# Patient Record
Sex: Female | Born: 1995
Health system: Southern US, Community
[De-identification: ages and names within clinical notes are randomized; demographics above are authoritative.]

## PROBLEM LIST (undated history)

## (undated) DIAGNOSIS — G4733 Obstructive sleep apnea (adult) (pediatric): Secondary | ICD-10-CM

## (undated) DIAGNOSIS — F411 Generalized anxiety disorder: Secondary | ICD-10-CM

## (undated) DIAGNOSIS — D509 Iron deficiency anemia, unspecified: Secondary | ICD-10-CM

## (undated) DIAGNOSIS — R002 Palpitations: Secondary | ICD-10-CM

## (undated) DIAGNOSIS — F329 Major depressive disorder, single episode, unspecified: Secondary | ICD-10-CM

## (undated) DIAGNOSIS — F32A Depression, unspecified: Secondary | ICD-10-CM

## (undated) DIAGNOSIS — F1210 Cannabis abuse, uncomplicated: Secondary | ICD-10-CM

## (undated) DIAGNOSIS — J45990 Exercise induced bronchospasm: Secondary | ICD-10-CM

## (undated) DIAGNOSIS — F938 Other childhood emotional disorders: Secondary | ICD-10-CM

## (undated) DIAGNOSIS — Z8719 Personal history of other diseases of the digestive system: Secondary | ICD-10-CM

## (undated) DIAGNOSIS — A6920 Lyme disease, unspecified: Secondary | ICD-10-CM

## (undated) DIAGNOSIS — Z87898 Personal history of other specified conditions: Secondary | ICD-10-CM

## (undated) DIAGNOSIS — K589 Irritable bowel syndrome without diarrhea: Secondary | ICD-10-CM

## (undated) DIAGNOSIS — K603 Anal fistula, unspecified: Secondary | ICD-10-CM

## (undated) DIAGNOSIS — J45909 Unspecified asthma, uncomplicated: Secondary | ICD-10-CM

## (undated) DIAGNOSIS — Z8619 Personal history of other infectious and parasitic diseases: Secondary | ICD-10-CM

## (undated) DIAGNOSIS — F3181 Bipolar II disorder: Secondary | ICD-10-CM

## (undated) DIAGNOSIS — F39 Unspecified mood [affective] disorder: Secondary | ICD-10-CM

## (undated) DIAGNOSIS — F4310 Post-traumatic stress disorder, unspecified: Secondary | ICD-10-CM

## (undated) DIAGNOSIS — F319 Bipolar disorder, unspecified: Secondary | ICD-10-CM

## (undated) DIAGNOSIS — K219 Gastro-esophageal reflux disease without esophagitis: Secondary | ICD-10-CM

## (undated) DIAGNOSIS — F419 Anxiety disorder, unspecified: Secondary | ICD-10-CM

## (undated) DIAGNOSIS — K5090 Crohn's disease, unspecified, without complications: Secondary | ICD-10-CM

## (undated) HISTORY — PX: TONSILECTOMY/ADENOIDECTOMY WITH MYRINGOTOMY: SHX6125

## (undated) HISTORY — PX: OTHER SURGICAL HISTORY: SHX169

---
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
## 2018-04-18 ENCOUNTER — Encounter (HOSPITAL_COMMUNITY): Payer: Self-pay

## 2018-04-18 ENCOUNTER — Emergency Department (HOSPITAL_COMMUNITY)
Admission: EM | Admit: 2018-04-18 | Discharge: 2018-04-18 | Disposition: A | Discharge status: Home / Self Care | Payer: BLUE CROSS/BLUE SHIELD | Attending: Emergency Medicine | Admitting: Emergency Medicine

## 2018-04-18 ENCOUNTER — Other Ambulatory Visit: Payer: Self-pay

## 2018-04-18 DIAGNOSIS — B9689 Other specified bacterial agents as the cause of diseases classified elsewhere: Secondary | ICD-10-CM

## 2018-04-18 DIAGNOSIS — Z79899 Other long term (current) drug therapy: Secondary | ICD-10-CM | POA: Diagnosis not present

## 2018-04-18 DIAGNOSIS — N760 Acute vaginitis: Secondary | ICD-10-CM | POA: Diagnosis not present

## 2018-04-18 DIAGNOSIS — N898 Other specified noninflammatory disorders of vagina: Secondary | ICD-10-CM | POA: Diagnosis present

## 2018-04-18 DIAGNOSIS — B373 Candidiasis of vulva and vagina: Secondary | ICD-10-CM

## 2018-04-18 DIAGNOSIS — B3731 Acute candidiasis of vulva and vagina: Secondary | ICD-10-CM

## 2018-04-18 LAB — URINALYSIS, ROUTINE W REFLEX MICROSCOPIC
BILIRUBIN URINE: NEGATIVE
GLUCOSE, UA: NEGATIVE mg/dL
HGB URINE DIPSTICK: NEGATIVE
Ketones, ur: NEGATIVE mg/dL
NITRITE: NEGATIVE
PROTEIN: NEGATIVE mg/dL
Specific Gravity, Urine: 1.005 (ref 1.005–1.030)
pH: 5 (ref 5.0–8.0)

## 2018-04-18 LAB — WET PREP, GENITAL
Sperm: NONE SEEN
Trich, Wet Prep: NONE SEEN
Yeast Wet Prep HPF POC: NONE SEEN

## 2018-04-18 LAB — I-STAT BETA HCG BLOOD, ED (MC, WL, AP ONLY): I-stat hCG, quantitative: <5 m[IU]/mL (ref <5)

## 2018-04-18 MED ORDER — METRONIDAZOLE 500 MG PO TABS: 500.0000 mg | ORAL_TABLET | Freq: Two times a day (BID) | ORAL | 0 refills | Status: DC

## 2018-04-18 MED ORDER — AZITHROMYCIN 250 MG PO TABS
1000.0000 mg | ORAL_TABLET | Freq: Once | ORAL | Status: AC
  Administered 2018-04-18: 1000 mg via ORAL
  Filled 2018-04-18: qty 4

## 2018-04-18 MED ORDER — FLUCONAZOLE 150 MG PO TABS: 150.0000 mg | ORAL_TABLET | Freq: Once | ORAL | 0 refills | Status: AC

## 2018-04-18 MED ORDER — CEFTRIAXONE SODIUM 250 MG IJ SOLR
250.0000 mg | Freq: Once | INTRAMUSCULAR | Status: AC
  Administered 2018-04-18: 250 mg via INTRAMUSCULAR
  Filled 2018-04-18: qty 250

## 2018-04-18 MED ORDER — STERILE WATER FOR INJECTION IJ SOLN
INTRAMUSCULAR | Status: AC
  Administered 2018-04-18: 1 mL
  Filled 2018-04-18: qty 10

## 2018-04-18 MED ORDER — FLUCONAZOLE 150 MG PO TABS
150.0000 mg | ORAL_TABLET | Freq: Once | ORAL | Status: AC
  Administered 2018-04-18: 150 mg via ORAL
  Filled 2018-04-18: qty 1

## 2018-04-18 NOTE — ED Provider Notes (Signed)
[facility]   CSN: [id number] Arrival date & time: 04/18/18  2055     History   Chief Complaint Chief Complaint  Patient presents with  . Vaginal Discharge  . Urinary Frequency    HPI Chelsea Hoover is a 22 y.o. female with a PMHx of anxiety, IBS, bipolar disorder, and other conditions listed below, who presents to the ED with complaints of 4 days of vaginal itching and burning as well as white vaginal discharge, states that she is concerned that she has "urethritis".  She has not tried anything for her symptoms, and wiping the vaginal area seems to worsen her symptoms.  She also reports associated increased urinary frequency and urgency as well as dysuria.  Additionally she noticed 2 small bumps on her right vaginal area.  She denies any recent changes in feminine products or douches.  She is sexually active with one female partner in the last year, unprotected.  LMP was 3 weeks ago.  She has an IUD in place.  She has an OB/GYN but she cannot recall the name of the practice or of the provider.  She denies fevers, chills, CP, SOB, abd pain, N/V/D/C, hematuria, malodorous urine, vaginal bleeding, myalgias, arthralgias, numbness, tingling, focal weakness, or any other complaints at this time.   The history is provided by the patient and medical records. No language interpreter was used.  Vaginal Discharge   Associated symptoms include dysuria and frequency. Pertinent negatives include no fever, no abdominal pain, no constipation, no diarrhea, no nausea and no vomiting.  Urinary Frequency  Pertinent negatives include no chest pain, no abdominal pain and no shortness of breath.    Past Medical History:  Diagnosis Date  . Anxiety   . 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 Bipolar 1 disorder (HCC)   . Cannabis abuse 09/03/2015  . Depression   . Irritable bowel syndrome   . Lyme disease   . Mood disorder (HCC)   . PTSD (post-traumatic  stress disorder) 09/01/2015    Patient Active Problem List   Diagnosis Date Noted  . Cannabis abuse 09/03/2015  . Bipolar 1 disorder, depressed (HCC) 09/01/2015  . 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 PTSD (post-traumatic stress disorder) 09/01/2015    History reviewed. No pertinent surgical history.   OB History   None      Home Medications    Prior to Admission medications   Medication Sig Start Date End Date Taking? Authorizing Provider  acetaminophen (TYLENOL) 500 MG tablet Take 500 mg by mouth daily as needed for moderate pain.   Yes [provider]  buPROPion (WELLBUTRIN SR) 100 MG 12 hr tablet Take 100 mg by mouth daily.  03/09/18  Yes [provider]  famotidine (PEPCID) 20 MG tablet Take 20 mg by mouth at bedtime.  03/16/18  Yes [provider]  hydrOXYzine (ATARAX/VISTARIL) 25 MG tablet Take 25 mg by mouth every 6 (six) hours as needed for anxiety.  03/24/18  Yes [provider]  lamoTRIgine (LAMICTAL) 150 MG tablet Take 150 mg by mouth 2 (two) times daily.   Yes [provider]  levonorgestrel (MIRENA) 20 MCG/24HR IUD 1 each by Intrauterine route once.   Yes [provider]  omeprazole (PRILOSEC) 40 MG capsule Take 40 mg by mouth 3 (three) times daily.  04/08/18  Yes [provider]  traZODone (DESYREL) 100 MG tablet Take 100 mg by mouth at bedtime.   Yes [provider]  citalopram (CELEXA) 10 MG tablet Take 1 tablet (10 mg total) by mouth daily. Patient not taking: Reported on 04/18/2018 09/08/15   Truman Hayward, FNP  lamoTRIgine (LAMICTAL) 200 MG tablet Take 1 tablet (200 mg total) by mouth daily after supper. Patient not taking: Reported on 07/07/2016 09/08/15   Truman Hayward, FNP  ondansetron (ZOFRAN) 4 MG tablet Take 1 tablet (4 mg total) by mouth every 8 (eight) hours as needed for nausea or vomiting. Patient not taking: Reported on 04/18/2018 07/07/16   Pisciotta, Joni Reining, PA-C    senna-docusate (SENOKOT-S) 8.6-50 MG tablet Start 1 tab QHS, up to 2tabs BID. Max 4 tabs daily for constipation Patient not taking: Reported on 04/18/2018 07/07/16   Pisciotta, Joni Reining, PA-C  traZODone (DESYREL) 100 MG tablet Take 1 tablet (100 mg total) by mouth at bedtime. Patient not taking: Reported on 04/18/2018 09/08/15   Truman Hayward, FNP    Family History Family History  Problem Relation Age of Onset  . Crohn's disease Father     Social History Social History   Tobacco Use  . Smoking status: Never Smoker  . Smokeless tobacco: Never Used  Substance Use Topics  . Alcohol use: No  . Drug use: Yes    Types: Marijuana    Comment: Few times a week     Allergies   Patient has no known allergies.   Review of Systems Review of Systems  Constitutional: Negative for chills and fever.  Respiratory: Negative for shortness of breath.   Cardiovascular: Negative for chest pain.  Gastrointestinal: Negative for abdominal pain, constipation, diarrhea, nausea and vomiting.  Genitourinary: Positive for dysuria, frequency, genital sores, urgency, vaginal discharge and vaginal pain (itching/burning). Negative for hematuria and vaginal bleeding.       No malodorous urine  Musculoskeletal: Negative for arthralgias and myalgias.  Skin: Negative for color change.  Allergic/Immunologic: Negative for immunocompromised state.  Neurological: Negative for weakness and numbness.  Psychiatric/Behavioral: Negative for confusion.   All other systems reviewed and are negative for acute change except as noted in the HPI.    Physical Exam Updated Vital Signs BP 122/83 (BP Location: Left Arm)   Pulse 93   Temp 98.8 F (37.1 C) (Oral)   Resp 16   Ht 5\' 1"  (1.549 m)   Wt 89.8 kg   SpO2 100%   BMI 37.41 kg/m   Physical Exam  Constitutional: She is oriented to person, place, and time. Vital signs are normal. She appears well-developed and well-nourished.  Non-toxic appearance. No distress.   Afebrile, nontoxic, NAD  HENT:  Head: Normocephalic and atraumatic.  Mouth/Throat: Oropharynx is clear and moist and mucous membranes are normal.  Eyes: Conjunctivae and EOM are normal. Right eye exhibits no discharge. Left eye exhibits no discharge.  Neck: Normal range of motion. Neck supple.  Cardiovascular: Normal rate, regular rhythm, normal heart sounds and intact distal pulses. Exam reveals no gallop and no friction rub.  No murmur heard. Pulmonary/Chest: Effort normal and breath sounds normal. No respiratory distress. She has no decreased breath sounds. She has no wheezes. She has no rhonchi. She has no rales.  Abdominal: Soft. Normal appearance and bowel sounds are normal. She exhibits no distension. There is no tenderness. There is no rigidity, no rebound, no guarding, no CVA tenderness, no tenderness at McBurney's point and negative Murphy's sign.  Soft, obese but nondistended, +BS throughout, with minimal suprapubic discomfort on palpation but pt denies any pain to palpation in the suprapubic area or  elsewhere in the abdomen, no r/g/r, neg murphy's, neg mcburney's, no CVA TTP   Genitourinary: Uterus normal. Pelvic exam was performed with patient supine. There is no rash, tenderness or lesion on the right labia. There is no rash, tenderness or lesion on the left labia. Cervix exhibits discharge and friability. Cervix exhibits no motion tenderness. Right adnexum displays no mass, no tenderness and no fullness. Left adnexum displays no mass, no tenderness and no fullness. There is erythema and tenderness in the vagina. No bleeding in the vagina. Vaginal discharge found.  Genitourinary Comments: Chaperone present for exam. No rashes, lesions, or tenderness to external genitalia. No injury to vaginal mucosa, however diffuse erythema and tenderness to vaginal mucosa. Thick white clumpy vaginal discharge without bleeding within vaginal vault. No adnexal masses, tenderness, or fullness. No CMT,  however some mild cervical friability and some yellowish discharge from cervical os. Cervical os is closed with IUD string in place. Uterus non-deviated, mobile, nonTTP, and without enlargement.    Musculoskeletal: Normal range of motion.  Neurological: She is alert and oriented to person, place, and time. She has normal strength. No sensory deficit.  Skin: Skin is warm, dry and intact. No rash noted.  Psychiatric: She has a normal mood and affect.  Nursing note and vitals reviewed.    ED Treatments / Results  Labs (all labs ordered are listed, but only abnormal results are displayed) Labs Reviewed  WET PREP, GENITAL - Abnormal; Notable for the following components:      Result Value   Clue Cells Wet Prep HPF POC PRESENT (*)    WBC, Wet Prep HPF POC MANY (*)    All other components within normal limits  URINALYSIS, ROUTINE W REFLEX MICROSCOPIC - Abnormal; Notable for the following components:   Color, Urine STRAW (*)    Leukocytes, UA MODERATE (*)    Bacteria, UA RARE (*)    All other components within normal limits  RPR  HIV ANTIBODY (ROUTINE TESTING)  I-STAT BETA HCG BLOOD, ED ([facility], [facility], [facility] ONLY)  GC/CHLAMYDIA PROBE AMP ([place]) NOT [facility]    EKG None  Radiology No results found.  Procedures Procedures (including critical care time)  Medications Ordered in ED Medications  azithromycin (ZITHROMAX) tablet 1,000 mg (1,000 mg Oral Given 04/18/18 2257)    And  cefTRIAXone (ROCEPHIN) injection 250 mg (250 mg Intramuscular Given 04/18/18 2258)  fluconazole (DIFLUCAN) tablet 150 mg (150 mg Oral Given 04/18/18 2258)  sterile water (preservative free) injection (1 mL  Given 04/18/18 2258)     Initial Impression / Assessment and Plan / ED Course  I have reviewed the triage vital signs and the nursing notes.  Pertinent labs & imaging results that were available during my care of the patient were reviewed by me and considered in my medical decision making (see chart for  details).     22 y.o. female here with vaginal itching/burning and discharge x4 days. Also with urinary frequency/urgency and dysuria. On exam, very mild suprapubic discomfort but no actual TTP to abdomen, pelvic exam reveals erythematous irritated vaginal mucosa which is mildly TTP with thick clumpy white vaginal discharge in vaginal vault and slightly yellowish cervical discharge and some cervical friability but no CMT and no adnexal tenderness. Will send STD panel, wet prep, and get U/A and betaHCG. Will likely empirically treat for yeast and GC/CT but will await betaHCG and wet prep results. Doubt need for other labs or imaging. Will reassess shortly.   11:13 PM U/A  with no nitrites, +leuks, 0-5 squamous and WBC/RBCs, rare bacteria; doubt UTI, symptoms likely from vaginitis/urethritis. BetaHCG neg. Wet prep with many WBCs and +clue cells, no trich/yeast seen. Will still empirically treat for yeast vaginitis given symptoms/exam findings, and will send home with tx for BV. Will send home with diflucan dose to take after finishing flagyl, if she still has symptoms. Empirically treated for GC/CT today. Discussed abstinence x10 days. F/up with health dept for future STD concerns. Safe sex encouraged, and discussed having partners tested and treated before re-engaging in intercourse after the 10 day abstinence period. Advised avoiding harsh soaps/detergents and douching. F/up with her OBGYN in 1wk for recheck of symptoms. I explained the diagnosis and have given explicit precautions to return to the ER including for any other new or worsening symptoms. The patient understands and accepts the medical plan as it's been dictated and I have answered their questions. Discharge instructions concerning home care and prescriptions have been given. The patient is STABLE and is discharged to home in good condition.    Final Clinical Impressions(s) / ED Diagnoses   Final diagnoses:  Yeast vaginitis  BV (bacterial  vaginosis)    ED Discharge Orders         Ordered    fluconazole (DIFLUCAN) 150 MG tablet   Once     04/18/18 2312    metroNIDAZOLE (FLAGYL) 500 MG tablet  2 times daily     04/18/18 [address]           Shauntae Reitman, [place], [place] JerseyPA-C 04/18/18 2314    Tegeler, Canary Brimhristopher J, MD 04/19/18 0001

## 2018-04-18 NOTE — ED Triage Notes (Addendum)
Pt presents to ED from home for possible UTI and vaginal infection. PT reports that she noticed symptoms 2 days ago. Pt reports urinary frequency, and pain with urination. Pt also endorsing vaginal discharge.

## 2018-04-18 NOTE — Discharge Instructions (Addendum)
Your vaginal swab today showed bacterial vaginosis, take Flagyl as directed until completed and DO NOT DRINK ALCOHOL WHILE TAKING THIS MEDICATION. You also have been treated for a vaginal yeast infection today, if you still have symptoms after finishing the flagyl then take the second dose of Diflucan (yeast infection medication), but if your symptoms have resolved then you don't have to take the second dose of Diflucan. Avoid douching, avoid harsh soaps/detergents or feminine products.  You have also been treated for gonorrhea and chlamydia in the ER but the hospital will call you if lab is positive. You were tested for HIV and Syphilis, and the hospital will call you if the lab is positive. NO SEXUAL INTERCOURSE FOR AT LEAST 10 DAYS AFTER TODAY'S VISIT, THIS WILL INVALIDATE YOUR TREATMENT HERE. DO NOT ENGAGE IN SEXUAL ACTIVITY UNTIL YOU FIND OUT ABOUT YOUR RESULTS AND HAVE PARTNERS TESTED AND TREATED. ALL PARTNERS MUST BE TESTED AND TREATED FOR STD'S. ALWAYS USE CONDOMS WHEN ENGAGING IN INTERCOURSE. [facility] for future STD concerns or screenings.  Follow up with your regular OBGYN in 1 week for recheck of symptoms. [facility]) for any changes or worsening symptoms.

## 2018-04-19 LAB — RPR: RPR Ser Ql: NONREACTIVE

## 2018-04-19 LAB — HIV ANTIBODY (ROUTINE TESTING W REFLEX): HIV Screen 4th Generation wRfx: NONREACTIVE

## 2018-04-20 LAB — GC/CHLAMYDIA PROBE AMP (~~LOC~~) NOT AT ARMC
CHLAMYDIA, DNA PROBE: NEGATIVE
Neisseria Gonorrhea: NEGATIVE

## 2019-04-14 HISTORY — PX: TONSILLECTOMY/ADENOIDECTOMY/TURBINATE REDUCTION: SHX6126

## 2019-09-06 ENCOUNTER — Inpatient Hospital Stay (HOSPITAL_COMMUNITY)
Admission: RE | Admit: 2019-09-06 | Discharge: 2019-09-13 | DRG: 885 | Disposition: A | Discharge status: Home / Self Care | Payer: BLUE CROSS/BLUE SHIELD | Attending: Psychiatry | Admitting: Psychiatry

## 2019-09-06 DIAGNOSIS — Z91040 Latex allergy status: Secondary | ICD-10-CM | POA: Diagnosis not present

## 2019-09-06 DIAGNOSIS — K219 Gastro-esophageal reflux disease without esophagitis: Secondary | ICD-10-CM | POA: Diagnosis present

## 2019-09-06 DIAGNOSIS — F1210 Cannabis abuse, uncomplicated: Secondary | ICD-10-CM | POA: Diagnosis present

## 2019-09-06 DIAGNOSIS — F319 Bipolar disorder, unspecified: Secondary | ICD-10-CM | POA: Diagnosis present

## 2019-09-06 DIAGNOSIS — R45851 Suicidal ideations: Secondary | ICD-10-CM | POA: Diagnosis not present

## 2019-09-06 DIAGNOSIS — K589 Irritable bowel syndrome without diarrhea: Secondary | ICD-10-CM | POA: Diagnosis present

## 2019-09-06 DIAGNOSIS — Z915 Personal history of self-harm: Secondary | ICD-10-CM

## 2019-09-06 DIAGNOSIS — G4700 Insomnia, unspecified: Secondary | ICD-10-CM | POA: Diagnosis present

## 2019-09-06 DIAGNOSIS — Z20822 Contact with and (suspected) exposure to covid-19: Secondary | ICD-10-CM | POA: Diagnosis present

## 2019-09-06 DIAGNOSIS — Z79899 Other long term (current) drug therapy: Secondary | ICD-10-CM | POA: Diagnosis not present

## 2019-09-06 DIAGNOSIS — Z885 Allergy status to narcotic agent status: Secondary | ICD-10-CM

## 2019-09-07 ENCOUNTER — Encounter (HOSPITAL_COMMUNITY): Payer: Self-pay | Admitting: Nurse Practitioner

## 2019-09-07 ENCOUNTER — Other Ambulatory Visit: Payer: Self-pay

## 2019-09-07 DIAGNOSIS — F319 Bipolar disorder, unspecified: Principal | ICD-10-CM | POA: Diagnosis present

## 2019-09-07 LAB — COMPREHENSIVE METABOLIC PANEL
ALT: 18 U/L (ref 0–44)
AST: 18 U/L (ref 15–41)
Albumin: 4.4 g/dL (ref 3.5–5.0)
Alkaline Phosphatase: 89 U/L (ref 38–126)
Anion gap: 10 (ref 5–15)
BUN: 17 mg/dL (ref 6–20)
CO2: 24 mmol/L (ref 22–32)
Calcium: 9.2 mg/dL (ref 8.9–10.3)
Chloride: 103 mmol/L (ref 98–111)
Creatinine, Ser: 0.84 mg/dL (ref 0.44–1.00)
GFR calc Af Amer: >60 mL/min (ref >60)
GFR calc non Af Amer: >60 mL/min (ref >60)
Glucose, Bld: 80 mg/dL (ref 70–99)
Potassium: 3.9 mmol/L (ref 3.5–5.1)
Sodium: 137 mmol/L (ref 135–145)
Total Bilirubin: 0.3 mg/dL (ref 0.3–1.2)
Total Protein: 7.5 g/dL (ref 6.5–8.1)

## 2019-09-07 LAB — LIPID PANEL
Cholesterol: 166 mg/dL (ref 0–200)
HDL: 63 mg/dL (ref >40)
LDL Cholesterol: 89 mg/dL (ref 0–99)
Total CHOL/HDL Ratio: 2.6 RATIO
Triglycerides: 72 mg/dL (ref <150)
VLDL: 14 mg/dL (ref 0–40)

## 2019-09-07 LAB — CBC
HCT: 36.6 % (ref 36.0–46.0)
Hemoglobin: 11 g/dL — ABNORMAL LOW (ref 12.0–15.0)
MCH: 23.6 pg — ABNORMAL LOW (ref 26.0–34.0)
MCHC: 30.1 g/dL (ref 30.0–36.0)
MCV: 78.4 fL — ABNORMAL LOW (ref 80.0–100.0)
Platelets: 473 10*3/uL — ABNORMAL HIGH (ref 150–400)
RBC: 4.67 MIL/uL (ref 3.87–5.11)
RDW: 16.9 % — ABNORMAL HIGH (ref 11.5–15.5)
WBC: 15.3 10*3/uL — ABNORMAL HIGH (ref 4.0–10.5)
nRBC: 0 % (ref 0.0–0.2)

## 2019-09-07 LAB — RAPID URINE DRUG SCREEN, HOSP PERFORMED
Amphetamines: NOT DETECTED
Barbiturates: NOT DETECTED
Benzodiazepines: NOT DETECTED
Cocaine: NOT DETECTED
Opiates: NOT DETECTED
Tetrahydrocannabinol: NOT DETECTED

## 2019-09-07 LAB — PREGNANCY, URINE: Preg Test, Ur: NEGATIVE

## 2019-09-07 LAB — RESPIRATORY PANEL BY RT PCR (FLU A&B, COVID)
Influenza A by PCR: NEGATIVE
Influenza B by PCR: NEGATIVE
SARS Coronavirus 2 by RT PCR: NEGATIVE

## 2019-09-07 LAB — TSH: TSH: 3.841 u[IU]/mL (ref 0.350–4.500)

## 2019-09-07 MED ORDER — TRAZODONE HCL 50 MG PO TABS
50.0000 mg | ORAL_TABLET | Freq: Every day | ORAL | Status: DC
  Administered 2019-09-07 – 2019-09-11 (×5): 50 mg via ORAL
  Filled 2019-09-07 (×7): qty 1

## 2019-09-07 MED ORDER — ALUM & MAG HYDROXIDE-SIMETH 200-200-20 MG/5ML PO SUSP
30.0000 mL | ORAL | Status: DC | PRN
Start: 2019-09-07 — End: 2019-09-13

## 2019-09-07 MED ORDER — LOPERAMIDE HCL 2 MG PO CAPS: 2.0000 mg | ORAL_CAPSULE | ORAL | Status: DC | PRN

## 2019-09-07 MED ORDER — LAMOTRIGINE 150 MG PO TABS
150.0000 mg | ORAL_TABLET | Freq: Two times a day (BID) | ORAL | Status: DC
  Administered 2019-09-07 – 2019-09-13 (×13): 150 mg via ORAL
  Filled 2019-09-07 (×15): qty 1

## 2019-09-07 MED ORDER — HYDROXYZINE HCL 25 MG PO TABS: 25.0000 mg | ORAL_TABLET | Freq: Four times a day (QID) | ORAL | Status: DC | PRN

## 2019-09-07 MED ORDER — ACETAMINOPHEN 325 MG PO TABS
650.0000 mg | ORAL_TABLET | Freq: Four times a day (QID) | ORAL | Status: DC | PRN
  Administered 2019-09-07: 21:00:00 650 mg via ORAL
  Filled 2019-09-07: qty 2

## 2019-09-07 MED ORDER — MAGNESIUM HYDROXIDE 400 MG/5ML PO SUSP: 30.0000 mL | Freq: Every day | ORAL | Status: DC | PRN

## 2019-09-07 MED ORDER — FAMOTIDINE 20 MG PO TABS
20.0000 mg | ORAL_TABLET | Freq: Two times a day (BID) | ORAL | Status: DC
  Administered 2019-09-07 – 2019-09-13 (×13): 20 mg via ORAL
  Filled 2019-09-07 (×16): qty 1

## 2019-09-07 MED ORDER — ADULT MULTIVITAMIN W/MINERALS CH
1.0000 | ORAL_TABLET | Freq: Every day | ORAL | Status: DC
  Administered 2019-09-07 – 2019-09-13 (×7): 1 via ORAL
  Filled 2019-09-07 (×9): qty 1

## 2019-09-07 MED ORDER — HYDROXYZINE HCL 25 MG PO TABS
25.0000 mg | ORAL_TABLET | Freq: Four times a day (QID) | ORAL | Status: AC | PRN
  Administered 2019-09-07: 21:00:00 25 mg via ORAL
  Filled 2019-09-07: qty 1

## 2019-09-07 MED ORDER — LORAZEPAM 1 MG PO TABS: 1.0000 mg | ORAL_TABLET | Freq: Four times a day (QID) | ORAL | Status: AC | PRN

## 2019-09-07 MED ORDER — TRAZODONE HCL 100 MG PO TABS
100.0000 mg | ORAL_TABLET | Freq: Every day | ORAL | Status: DC
Start: 2019-09-07 — End: 2019-09-07
  Filled 2019-09-07 (×2): qty 1

## 2019-09-07 MED ORDER — ONDANSETRON HCL 4 MG PO TABS
4.0000 mg | ORAL_TABLET | Freq: Once | ORAL | Status: AC
  Administered 2019-09-07: 21:00:00 4 mg via ORAL
  Filled 2019-09-07 (×2): qty 1

## 2019-09-07 MED ORDER — ARIPIPRAZOLE 5 MG PO TABS
5.0000 mg | ORAL_TABLET | Freq: Every day | ORAL | Status: DC
  Administered 2019-09-07 – 2019-09-10 (×4): 5 mg via ORAL
  Filled 2019-09-07 (×7): qty 1

## 2019-09-07 MED ORDER — THIAMINE HCL 100 MG PO TABS
100.0000 mg | ORAL_TABLET | Freq: Every day | ORAL | Status: DC
  Administered 2019-09-08 – 2019-09-13 (×6): 100 mg via ORAL
  Filled 2019-09-07 (×9): qty 1

## 2019-09-07 MED ORDER — ONDANSETRON 4 MG PO TBDP: 4.0000 mg | ORAL_TABLET | Freq: Four times a day (QID) | ORAL | Status: DC | PRN

## 2019-09-07 MED ORDER — PANTOPRAZOLE SODIUM 40 MG PO TBEC
40.0000 mg | DELAYED_RELEASE_TABLET | Freq: Every day | ORAL | Status: DC
  Administered 2019-09-07 – 2019-09-13 (×7): 40 mg via ORAL
  Filled 2019-09-07 (×8): qty 1

## 2019-09-07 MED ORDER — THIAMINE HCL 100 MG/ML IJ SOLN
100.0000 mg | Freq: Once | INTRAMUSCULAR | Status: AC
  Administered 2019-09-07: 100 mg via INTRAMUSCULAR
  Filled 2019-09-07: qty 2

## 2019-09-07 NOTE — [facility] Suicide Risk Assessment (Addendum)
[facility] Admission Suicide Risk Assessment   Nursing information obtained from:  Patient, Review of record Demographic factors:  Adolescent or young adult Current Mental Status:  Suicidal ideation indicated by patient, Suicide plan Loss Factors:  NA Historical Factors:  Prior suicide attempts Risk Reduction Factors:  Positive social support, Employed, Positive therapeutic relationship, Sense of responsibility to family, Living with another person, especially a relative, Positive coping skills or problem solving skills  Total Time spent with patient: 45 minutes Principal Problem:  Bipolar Disorder , Depressed  Diagnosis:  Active Problems:   Bipolar 1 disorder ([place])  Subjective Data:   Continued Clinical Symptoms:  Alcohol Use Disorder Identification Test Final Score (AUDIT): 11 The "Alcohol Use Disorders Identification Test", Guidelines for Use in Primary Care, Second Edition.  World Pharmacologist [facility]). Score between 0-7:  no or low risk or alcohol related problems. Score between 8-15:  moderate risk of alcohol related problems. Score between 16-19:  high risk of alcohol related problems. Score 20 or above:  warrants further diagnostic evaluation for alcohol dependence and treatment.   CLINICAL FACTORS:  23, single, no children, lives with parents, employed . Presented to hospital voluntarily reporting worsening depression. Reports she suspects she had COVID several weeks ago ( family members were COVID positive, she also had symptoms , but she was not tested ). Reports she has a chronic history of depression but had been generally doing well until then. States " after that I started going down hill, as if my medications stopped working ". She reports contributing stressors- strained , stressful home environment, particularly with father. States her family is [employer] and she left church , which has made family life more tense. She also reports stressful job. She reports recent passive  SI, without plan or intention, but states this last weekend had thoughts of crashing car or of overdosing. Denies history of psychosis. Endorses neuro-vegetative symptoms- poor sleep, increased appetite, decreased energy, anhedonia. History of GERD and of exercise induced asthma. NKDA ( reports oxycodone caused vomiting). Does not smoke tobacco. Denies history of seizures  Home medications - Lamictal 150 mgrs BID, Wellbutrin XL 150 mgrs QDAY , Trazodone 100 mgrs QHS. She reports she was taking these medications regularly up to a few days prior to admission.  History of prior psychiatric admissions , most recently in 2016 for depression.  Reports she has been diagnosed with Bipolar Disorder and also reports history of chronic anxiety, and describes episodes of increased energy, racing thoughts, irritability usually lasting a day or two.  History of suicide attempt by overdose in 2016. Past history of self cutting, states she last cut 2 years ago.  Reports she has been drinking up to 6 beers or shots of liquor on most days. Reports history of alcohol blackouts, denies history of seizures . Smokes cannabis daily.  Dx- Bipolar Disorder, Depressed , Alcohol Use Disorder , consider Substance Induced Mood Disorder - Depressed   Plan- Inpatient treatment . We reviewed treatment options . She states she would prefer to avoid medications associated with high risk of gaining weight Will start Ativan PRN as per CIWA protocol for alcohol WDL if needed  Agrees to Abilify trial, start 5 mgrs QDAY  Will resume Lamictal at 150 mgrs BID. Patient reports Wellbutrin XL has been well tolerated and helpful ( until recently). Would consider increasing dose to 300 mgrs QDAY, but will defer restarting Wellbutrin XL at this time as there may be an increased risk of seizure  in the context  of alcohol WDL.       Musculoskeletal: Strength & Muscle Tone: within normal limits no current tremors or diaphoresis, no  restlessness or psychomotor agitation.  Gait & Station: normal Patient leans: N/A  Psychiatric Specialty Exam: Physical Exam  Review of Systems reports history of headaches, no chest pain, no shortness of breath, no cough, no vomiting , no rash  Blood pressure (!) 128/94, pulse 87, temperature 99 F (37.2 C), temperature source Oral, resp. rate 16, height 5\' 1"  (1.549 m), weight 88.9 kg, last menstrual period 09/07/2019.Body mass index is 37.03 kg/m.  General Appearance: Fairly Groomed  Eye Contact:  Good  Speech:  Normal Rate  Volume:  Decreased  Mood:  Depressed  Affect:  Constricted  Thought Process:  Linear and Descriptions of Associations: Intact  Orientation:  Other:  fully alert and attentive   Thought Content:  no hallucinations,no delusions   Suicidal Thoughts:  No  Denies current suicidal ideations or self injurious ideations, contracts for safety on unit, denies homicidal or violent ideations   Homicidal Thoughts:  No  Memory:  recent and remote grossly intact   Judgement:  Fair  Insight:  Fair  Psychomotor Activity:  Normal- no tremors, no diaphoresis  Concentration:  Concentration: Good and Attention Span: Good  Recall:  Good  Fund of Knowledge:  Good  Language:  Good  Akathisia:  Negative  Handed:  Right  AIMS (if indicated):     Assets:  Desire for Improvement Resilience  ADL's:  Intact  Cognition:  WNL  Sleep:  Number of Hours: 0.5(just admitted to the unit)      COGNITIVE FEATURES THAT CONTRIBUTE TO RISK:  Closed-mindedness and Loss of executive function    SUICIDE RISK:   Moderate:  Frequent suicidal ideation with limited intensity, and duration, some specificity in terms of plans, no associated intent, good self-control, limited dysphoria/symptomatology, some risk factors present, and identifiable protective factors, including available and accessible social support.  PLAN OF CARE: Patient will be admitted to inpatient psychiatric unit for  stabilization and safety. Will provide and encourage milieu participation. Provide medication management and maked adjustments as needed. Will also start medication to minimize risk of alcohol WDL .  Will follow daily.    I certify that inpatient services furnished can reasonably be expected to improve the patient's condition.   11/05/2019, MD 09/07/2019, 10:05 AM

## 2019-09-07 NOTE — Progress Notes (Addendum)
Patient ID: Chelsea Hoover, female   DOB: 01-09-1996, 24 y.o.   MRN: [id number] Admission Note  D: Pt is 23 y o female who drove herself to [facility] for increased SI with thoughts of cutting herself to commit suicide. Pt stressors: family conflict with father and job dissatisfaction (works at [employer]). Pt uses alcohol to cope with stressors. Pt denies SI/HI/AV. Pt is pleasant and cooperative. Pt has hx of IBS and is having discomfort now.  A: Pt belongings searched and placed in locker along with PTA meds in belongings. Pt height and weight checked and paperwork signed.Pt was offered support. Provider paged and order placed for famotidine 20 mg. Q 15 minute checks for safety started.  R:Pt oriented to room on unit and introduced to staff. Pt has no complaints at this time.Pt receptive to treatment and safety maintained on unit.
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
## 2019-09-07 NOTE — Progress Notes (Signed)
    09/07/19 1000  Psych Admission Type (Psych Patients Only)  Admission Status Voluntary  Psychosocial Assessment  Patient Complaints Depression  Eye Contact Brief  Facial Expression Flat  Affect Appropriate to circumstance  Speech Logical/coherent  Interaction Assertive  Motor Activity Slow  Appearance/Hygiene Unremarkable  Behavior Characteristics Cooperative  Mood Depressed  Aggressive Behavior  Effect No apparent injury  Thought Process  Coherency WDL  Content WDL  Delusions WDL  Perception WDL  Hallucination None reported or observed  Judgment WDL  Confusion WDL  Danger to Self  Current suicidal ideation? Passive  Self-Injurious Behavior No self-injurious ideation or behavior indicators observed or expressed   Agreement Not to Harm Self Yes  Description of Agreement Verbal contract  Danger to Others  Danger to Others None reported or observed

## 2019-09-07 NOTE — H&P (Signed)
Behavioral Health Medical Screening Exam  Chelsea Hoover is an 24 y.o. female.  Total Time spent with patient: 15 minutes  Psychiatric Specialty Exam: Physical Exam  Constitutional: She is oriented to person, place, and time. She appears well-developed and well-nourished. No distress.  HENT:  Head: Normocephalic and atraumatic.  Right Ear: External ear normal.  Left Ear: External ear normal.  Eyes: Pupils are equal, round, and reactive to light. Right eye exhibits no discharge. Left eye exhibits no discharge.  Respiratory: Effort normal. No respiratory distress.  Musculoskeletal:        General: Normal range of motion.  Neurological: She is alert and oriented to person, place, and time.  Skin: She is not diaphoretic.  Psychiatric: Her mood appears anxious. She is not withdrawn and not actively hallucinating. Thought content is not paranoid and not delusional. She exhibits a depressed mood. She expresses suicidal ideation. She expresses no homicidal ideation. She expresses suicidal plans.    Review of Systems  Constitutional: Positive for activity change and appetite change. Negative for chills, diaphoresis, fatigue, fever and unexpected weight change.  Respiratory: Negative for cough, shortness of breath and wheezing.   Cardiovascular: Negative for chest pain.  Gastrointestinal: Negative for diarrhea, nausea and vomiting.  Musculoskeletal: Negative.   Neurological: Negative.   Psychiatric/Behavioral: Positive for decreased concentration, dysphoric mood, sleep disturbance and suicidal ideas. Negative for hallucinations. The patient is nervous/anxious.     There were no vitals taken for this visit.There is no height or weight on file to calculate BMI.  General Appearance: Casual and Fairly Groomed  Eye Contact:  Fair  Speech:  Clear and Coherent and Normal Rate  Volume:  Decreased  Mood:  Anxious, Depressed, Hopeless and Worthless  Affect:  Congruent and Depressed  Thought Process:   Coherent, Goal Directed, Linear and Descriptions of Associations: Intact  Orientation:  Full (Time, Place, and Person)  Thought Content:  Logical and Hallucinations: None  Suicidal Thoughts:  Yes.  with intent/plan  Homicidal Thoughts:  No  Memory:  Immediate;   Good Recent;   Good  Judgement:  Intact  Insight:  Lacking  Psychomotor Activity:  Normal  Concentration: Concentration: Fair  Recall:  Good  Fund of Knowledge:Good  Language: Good  Akathisia:  Negative  Handed:  Right  AIMS (if indicated):     Assets:  Communication Skills Desire for Improvement Financial Resources/Insurance Housing Leisure Time Physical Health  Sleep:       Musculoskeletal: Strength & Muscle Tone: within normal limits Gait & Station: normal Patient leans: N/A  There were no vitals taken for this visit.  Recommendations:  Based on my evaluation the patient does not appear to have an emergency medical condition.  Jackelyn Poling, NP 09/07/2019, 1:03 AM

## 2019-09-07 NOTE — Tx Team (Addendum)
Initial Treatment Plan 09/07/2019 3:57 AM [name] MGQ:[id number]    PATIENT STRESSORS: Family conflict Job issues   PATIENT STRENGTHS: Average or above average intelligence Capable of independent living Communication skills   PATIENT IDENTIFIED PROBLEMS: Alcohol use  Job dissatisfaction  Depression                 DISCHARGE CRITERIA:  Improved stabilization in mood, thinking, and/or behavior Motivation to continue treatment in a less acute level of care Verbal commitment to aftercare and medication compliance  PRELIMINARY DISCHARGE PLAN: Attend PHP/IOP Outpatient therapy Return to previous living arrangement Return to previous work or school arrangements  PATIENT/FAMILY INVOLVEMENT: This treatment plan has been presented to and reviewed with the patient, Tonye Tancredi, and/or family member.  The patient and family have been given the opportunity to ask questions and make suggestions.  Victorino December, RN 09/07/2019, 3:57 AM

## 2019-09-07 NOTE — Progress Notes (Signed)
Patient ID: Chelsea Hoover, female   DOB: 16-Mar-1996, 24 y.o.   MRN: [id number] Pt presents with SI, plan to cut herself as SI attempt.  Pt admits to 2 previous SI attempts.  Denies HI or AVH.  Pt reports she smokes a bowl of Marijuana daily to help her sleep.  Pt follows up with Dr Jannifer Franklin on out patient basis.  Skin search completed, COVID test is negative.  Monitoring for safety,  Pending report & transfer to [address].

## 2019-09-07 NOTE — Progress Notes (Signed)
Adult Psychoeducational Group Note  Date:  09/07/2019 Time:  9:01 PM  Group Topic/Focus:  Wrap-Up Group:   The focus of this group is to help patients review their daily goal of treatment and discuss progress on daily workbooks.  Participation Level:  Minimal  Participation Quality:  Appropriate  Affect:  Appropriate  Cognitive:  Appropriate  Insight: Lacking  Engagement in Group:  Engaged  Modes of Intervention:  Discussion  Additional Comments:  Patient attended group, but did not participate because she was feeling ill.   Melesa Lecy W Marie Borowski 09/07/2019, 9:01 PM
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
## 2019-09-07 NOTE — Progress Notes (Signed)
Recreation Therapy Notes  Animal-Assisted Activity (AAA) Program Checklist/Progress Notes Patient Eligibility Criteria Checklist & Daily Group note for Rec Tx Intervention  Date: 1.5.21 Time: 1430 Location: [address]   AAA/T Program Assumption of Risk Form signed by Engineer, production or Parent Legal Guardian  YES   Patient is free of allergies or sever asthma  YES   Patient reports no fear of animals  YES   Patient reports no history of cruelty to animals  YES   Patient understands his/her participation is voluntary  YES   Patient washes hands before animal contact  YES   Patient washes hands after animal contact  YES   Behavioral Response: Engaged  Education: Charity fundraiser, Appropriate Animal Interaction   Education Outcome: Acknowledges understanding/In group clarification offered/Needs additional education.   Clinical Observations/Feedback:  Pt attended and participated in activity.    Caroll Rancher, LRT/CTRS         Caroll Rancher A 09/07/2019 3:38 PM

## 2019-09-07 NOTE — Plan of Care (Signed)
Pt just admitted to the unit.  Problem: Safety: Goal: Periods of time without injury will increase Outcome: Progressing
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
## 2019-09-08 LAB — HEMOGLOBIN A1C
Hgb A1c MFr Bld: 5.3 % (ref 4.8–5.6)
Mean Plasma Glucose: 105 mg/dL

## 2019-09-08 MED ORDER — BUPROPION HCL ER (XL) 150 MG PO TB24
150.0000 mg | ORAL_TABLET | Freq: Every day | ORAL | Status: DC
  Administered 2019-09-09 – 2019-09-10 (×2): 150 mg via ORAL
  Filled 2019-09-08 (×4): qty 1

## 2019-09-08 NOTE — Progress Notes (Signed)
Pt c/o nausea and headache. Pt asked if this was what withdrawal from EtOH was like. Answered in the affirmative. Pt CIWA=10. Pt given Vistaril 25 mg, zofran 4 mg and tylenol 650 mg.

## 2019-09-08 NOTE — [facility] Suicide Risk Assessment (Signed)
[facility] INPATIENT:  Family/Significant Other Suicide Prevention Education  Suicide Prevention Education:  Education Completed; with mother, Jamile Rekowski [phone]) has been identified by the patient as the family member/significant other with whom the patient will be residing, and identified as the person(s) who will aid the patient in the event of a mental health crisis (suicidal ideations/suicide attempt).  With written consent from the patient, the family member/significant other has been provided the following suicide prevention education, prior to the and/or following the discharge of the patient.  The suicide prevention education provided includes the following:  Suicide risk factors  Suicide prevention and interventions  National Suicide Hotline telephone number  [facility] assessment telephone number  [facility] Emergency Assistance 911  [facility] and/or Residential Mobile Crisis Unit telephone number  Request made of family/significant other to:  Remove weapons (e.g., guns, rifles, knives), all items previously/currently identified as safety concern.    Remove drugs/medications (over-the-counter, prescriptions, illicit drugs), all items previously/currently identified as a safety concern.  The family member/significant other verbalizes understanding of the suicide prevention education information provided.  The family member/significant other agrees to remove the items of safety concern listed above.  Nalani reports having some concerns regarding the patient's alcohol use. She reports that "alcoholism runs in our family" and she does want her daughter to follow those footsteps. Nalani shared that the patient is very responsible and is compliant with her medications. There were no further questions or concerns at this time.   Maeola Sarah 09/08/2019, 10:06 AM

## 2019-09-08 NOTE — Tx Team (Signed)
Interdisciplinary Treatment and Diagnostic Plan Update  09/08/2019 Time of Session: 9:00am Chelsea Hoover MRN: [id number]  Principal Diagnosis: <principal problem not specified>  Secondary Diagnoses: Active Problems:   Bipolar 1 disorder (HCC)   Current Medications:  Current Facility-Administered Medications  Medication Dose Route Frequency Provider Last Rate Last Admin  . acetaminophen (TYLENOL) tablet 650 mg  650 mg Oral Q6H PRN Nira Conn A, NP   650 mg at 09/07/19 2036  . alum & mag hydroxide-simeth (MAALOX/MYLANTA) 200-200-20 MG/5ML suspension 30 mL  30 mL Oral Q4H PRN Nira Conn A, NP      . ARIPiprazole (ABILIFY) tablet 5 mg  5 mg Oral Daily Cobos, Rockey Situ, MD   5 mg at 09/08/19 0846  . famotidine (PEPCID) tablet 20 mg  20 mg Oral BID Nira Conn A, NP   20 mg at 09/08/19 0845  . hydrOXYzine (ATARAX/VISTARIL) tablet 25 mg  25 mg Oral Q6H PRN Cobos, Rockey Situ, MD   25 mg at 09/07/19 2037  . lamoTRIgine (LAMICTAL) tablet 150 mg  150 mg Oral BID Nira Conn A, NP   150 mg at 09/08/19 0846  . LORazepam (ATIVAN) tablet 1 mg  1 mg Oral Q6H PRN Cobos, Fernando A, MD      . magnesium hydroxide (MILK OF MAGNESIA) suspension 30 mL  30 mL Oral Daily PRN Nira Conn A, NP      . multivitamin with minerals tablet 1 tablet  1 tablet Oral Daily Cobos, Rockey Situ, MD   1 tablet at 09/08/19 0845  . pantoprazole (PROTONIX) EC tablet 40 mg  40 mg Oral Daily Nira Conn A, NP   40 mg at 09/08/19 0846  . thiamine tablet 100 mg  100 mg Oral Daily Cobos, Rockey Situ, MD   100 mg at 09/08/19 0848  . traZODone (DESYREL) tablet 50 mg  50 mg Oral QHS Cobos, Rockey Situ, MD   50 mg at 09/07/19 2129   PTA Medications: Medications Prior to Admission  Medication Sig Dispense Refill Last Dose  . acetaminophen (TYLENOL) 500 MG tablet Take 1,000 mg by mouth every 6 (six) hours as needed for moderate pain or headache.    09/05/2019  . buPROPion (WELLBUTRIN XL) 150 MG 24 hr tablet Take 150 mg by mouth  daily.   09/05/2019  . Colloidal Oatmeal (AVEENO ECZEMA THERAPY) 1 % CREA Apply 1 application topically as needed (For eczema.).   09/06/2019  . famotidine (PEPCID) 20 MG tablet Take 20 mg by mouth 2 (two) times daily.    09/06/2019  . hydrOXYzine (ATARAX/VISTARIL) 25 MG tablet Take 25 mg by mouth every 6 (six) hours as needed (For panic attacks.).    Past Month  . lamoTRIgine (LAMICTAL) 150 MG tablet Take 150 mg by mouth 2 (two) times daily.   09/05/2019  . traZODone (DESYREL) 100 MG tablet Take 100 mg by mouth at bedtime.   09/05/2019    Patient Stressors:    Patient Strengths: Average or above average intelligence Capable of independent living Communication skills  Treatment Modalities: Medication Management, Group therapy, Case management,  1 to 1 session with clinician, Psychoeducation, Recreational therapy.   Physician Treatment Plan for Primary Diagnosis: <principal problem not specified> Long Term Goal(s): Improvement in symptoms so as ready for discharge Improvement in symptoms so as ready for discharge   Short Term Goals: Ability to identify changes in lifestyle to reduce recurrence of condition will improve Ability to verbalize feelings will improve Ability to disclose and discuss suicidal ideas  Ability to demonstrate self-control will improve Ability to identify and develop effective coping behaviors will improve  Medication Management: Evaluate patient's response, side effects, and tolerance of medication regimen.  Therapeutic Interventions: 1 to 1 sessions, Unit Group sessions and Medication administration.  Evaluation of Outcomes: Progressing  Physician Treatment Plan for Secondary Diagnosis: Active Problems:   Bipolar 1 disorder ([place])  Long Term Goal(s): Improvement in symptoms so as ready for discharge Improvement in symptoms so as ready for discharge   Short Term Goals: Ability to identify changes in lifestyle to reduce recurrence of condition will improve Ability to  verbalize feelings will improve Ability to disclose and discuss suicidal ideas Ability to demonstrate self-control will improve Ability to identify and develop effective coping behaviors will improve     Medication Management: Evaluate patient's response, side effects, and tolerance of medication regimen.  Therapeutic Interventions: 1 to 1 sessions, Unit Group sessions and Medication administration.  Evaluation of Outcomes: Progressing   RN Treatment Plan for Primary Diagnosis: <principal problem not specified> Long Term Goal(s): Knowledge of disease and therapeutic regimen to maintain health will improve  Short Term Goals: Ability to demonstrate self-control, Ability to verbalize feelings will improve and Ability to identify and develop effective coping behaviors will improve  Medication Management: RN will administer medications as ordered by provider, will assess and evaluate patient's response and provide education to patient for prescribed medication. RN will report any adverse and/or side effects to prescribing provider.  Therapeutic Interventions: 1 on 1 counseling sessions, Psychoeducation, Medication administration, Evaluate responses to treatment, Monitor vital signs and CBGs as ordered, Perform/monitor CIWA, COWS, AIMS and Fall Risk screenings as ordered, Perform wound care treatments as ordered.  Evaluation of Outcomes: Progressing   LCSW Treatment Plan for Primary Diagnosis: <principal problem not specified> Long Term Goal(s): Safe transition to appropriate next level of care at discharge, Engage patient in therapeutic group addressing interpersonal concerns.  Short Term Goals: Engage patient in aftercare planning with referrals and resources, Increase social support, Increase emotional regulation, Identify triggers associated with mental health/substance abuse issues and Increase skills for wellness and recovery  Therapeutic Interventions: Assess for all discharge needs, 1  to 1 time with Social worker, Explore available resources and support systems, Assess for adequacy in community support network, Educate family and significant other(s) on suicide prevention, Complete Psychosocial Assessment, Interpersonal group therapy.  Evaluation of Outcomes: Progressing  Progress in Treatment: Attending groups: Yes. Participating in groups: Yes. Taking medication as prescribed: Yes. Toleration medication: Yes. Family/Significant other contact made: No, will contact:  mother Patient understands diagnosis: Yes. Discussing patient identified problems/goals with staff: Yes. Medical problems stabilized or resolved: Yes. Denies suicidal/homicidal ideation: No. Issues/concerns per patient self-inventory: No.  New problem(s) identified: No, Describe:  none.  New Short Term/Long Term Goal(s): medication management for mood stabilization; elimination of SI thoughts; development of comprehensive mental wellness/sobriety plan.  Patient Goals: "Get my medications straightened out."  Discharge Plan or Barriers: Patient expected to return home and will continue with Dr.Akintayo for medication management and her current therapist.  Reason for Continuation of Hospitalization: Anxiety Depression Medication stabilization Suicidal ideation  Estimated Length of Stay: 1-3 days  Attendees: Patient: Chelsea Hoover 09/08/2019 9:18 AM  Physician: Larene Beach 09/08/2019 9:18 AM  Nursing: Rise Paganini, RN  09/08/2019 9:18 AM  RN Care Manager: 09/08/2019 9:18 AM  Social Worker: Stephanie Acre, Metcalf 09/08/2019 9:18 AM  Recreational Therapist:  09/08/2019 9:18 AM  Other:  09/08/2019 9:18 AM  Other:  09/08/2019 9:18 AM  Other:  09/08/2019 9:18 AM    Scribe for Treatment Team: Darreld Mclean, LCSWA 09/08/2019 9:18 AM

## 2019-09-08 NOTE — Progress Notes (Signed)
Recreation Therapy Notes  Date:  1.6.21 Time: 0930 Location: [address] Dayroom  Group Topic: Stress Management  Goal Area(s) Addresses:  Patient will identify positive stress management techniques. Patient will identify benefits of using stress management post d/c.  Behavioral Response:  Engaged  Intervention: Stress Management  Activity :  Guided Imagery.  LRT read Hoover script that encouraged patients to enjoy the sounds of waves while relaxing on the beach. Patients were to listen and follow along as meditation.  Education:  Stress Management, Discharge Planning.   Education Outcome: Acknowledges Education  Clinical Observations/Feedback: Pt attended and participated in activity.    Chelsea Hoover, LRT/CTRS         Chelsea Hoover, Chelsea Hoover 09/08/2019 11:03 AM

## 2019-09-08 NOTE — Plan of Care (Signed)
Nurse discussed anxiety, depression and coping skills with patient.  

## 2019-09-08 NOTE — [facility] Group Notes (Signed)
LCSW Group Therapy Note 09/08/2019 2:31 PM  Type of Therapy and Topic: Group Therapy: Overcoming Obstacles  Participation Level: Active  Description of Group:  In this group patients will be encouraged to explore what they see as obstacles to their own wellness and recovery. They will be guided to discuss their thoughts, feelings, and behaviors related to these obstacles. The group will process together ways to cope with barriers, with attention given to specific choices patients can make. Each patient will be challenged to identify changes they are motivated to make in order to overcome their obstacles. This group will be process-oriented, with patients participating in exploration of their own experiences as well as giving and receiving support and challenge from other group members.  Therapeutic Goals: 1. Patient will identify personal and current obstacles as they relate to admission. 2. Patient will identify barriers that currently interfere with their wellness or overcoming obstacles.  3. Patient will identify feelings, thought process and behaviors related to these barriers. 4. Patient will identify two changes they are willing to make to overcome these obstacles:   Summary of Patient Progress  Chelsea Hoover was engaged and participated throughout the group session. Chelsea Hoover reports that her only obstacle is "I am extremely lethargic". She states she is not sure if it is her medications or her suspected alcohol withdrawals.    Therapeutic Modalities:  Cognitive Behavioral Therapy Solution Focused Therapy Motivational Interviewing Relapse Prevention Therapy   Chelsea Hoover Clinical Social Worker

## 2019-09-08 NOTE — Progress Notes (Addendum)
[facility] MD Progress Note  09/08/2019 10:58 AM Chelsea Hoover  MRN:  [id number] Subjective:  "I'm alright."  Ms. Chelsea Hoover found lying in bed. She appears fatigued and depressed. She reports good sleep overnight but continues to feel tired. She has been feeling tired since having COVID in November but reports feeling even more tired now. Denies other continuing COVID symptoms besides fatigue. Per nursing report she was complaining of withdrawal symptoms last night- nausea, headache, light sensitivity, tremors. She continues to report a headache today but denies other withdrawal symptoms currently. Her last drink was on the 3rd. She continues to worry about stressors at home. Her mother called last night and was scolding her for her drinking. Her family is also blaming her for her younger sister leaving the [employer] after the patient did. She reports the [employer] was a toxic environment for her and feels her experience there contributed to her mental health issues.  She continues to report depressed mood but does show fuller range of affect today and more future-oriented. She is planning to look for a higher-paying job so that she can move out from her parents' home. [place]. Her boyfriend visited last night, and she identifies him as a positive support. She denies SI/HI/AVH.  From admission H&P: Ms. Chelsea Hoover is a 24 year old female with history of bipolar disorder, presenting voluntarily for suicidal ideation with thoughts of cutting herself. She had COVID in early November and reports worsening symptoms of depression since that time, related to several weeks of isolation and feeling sick.  Principal Problem: <principal problem not specified> Diagnosis: Active Problems:   Bipolar 1 disorder (HCC)  Total Time spent with patient: 20 minutes  Past Psychiatric History: See admission H&P  Past Medical History:  Past Medical History:  Diagnosis Date  . Anxiety    . 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 Bipolar 1 disorder (HCC)   . Cannabis abuse 09/03/2015  . Depression   . Irritable bowel syndrome   . Lyme disease   . Mood disorder (HCC)   . PTSD (post-traumatic stress disorder) 09/01/2015   History reviewed. No pertinent surgical history. Family History:  Family History  Problem Relation Age of Onset  . Crohn's disease Father    Family Psychiatric  History: See admission H&P Social History:  Social History   Substance and Sexual Activity  Alcohol Use No     Social History   Substance and Sexual Activity  Drug Use Yes  . Types: Marijuana   Comment: Few times a week    Social History   Socioeconomic History  . Marital status: Single    Spouse name: Not on file  . Number of children: Not on file  . Years of education: Not on file  . Highest education level: Not on file  Occupational History  . Not on file  Tobacco Use  . Smoking status: Never Smoker  . Smokeless tobacco: Never Used  Substance and Sexual Activity  . Alcohol use: No  . Drug use: Yes    Types: Marijuana    Comment: Few times a week  . Sexual activity: Yes    Birth control/protection: Pill  Other Topics Concern  . Not on file  Social History Narrative  . Not on file   Social Determinants of Health   Financial Resource Strain:   . Difficulty of Paying Living Expenses: Not on file  Food Insecurity:   . Worried About [employer] Out of  Food in the Last Year: Not on file  . Ran Out of Food in the Last Year: Not on file  Transportation Needs:   . Lack of Transportation (Medical): Not on file  . Lack of Transportation (Non-Medical): Not on file  Physical Activity:   . Days of Exercise per Week: Not on file  . Minutes of Exercise per Session: Not on file  Stress:   . Feeling of Stress : Not on file  Social Connections:   . Frequency of Communication with Friends and Family: Not on file  . Frequency of Social Gatherings with Friends and Family: Not  on file  . Attends Religious Services: Not on file  . Active Member of Clubs or Organizations: Not on file  . Attends Banker Meetings: Not on file  . Marital Status: Not on file   Additional Social History:    Pain Medications: None Prescriptions: Welbutrin, Lamictal, Trazadone, Amogadone Over the Counter: Stomach acid reducer (hx of IBS) History of alcohol / drug use?: Yes Name of Substance 1: ETOH (beer & liquor) 1 - Age of First Use: 24 years of age 77 - Amount (size/oz): 4 beers and some liquor 1 - Frequency: Daily 1 - Duration: Over the last few weeks 1 - Last Use / Amount: 09/04/19 Name of Substance 2: Marijuana 2 - Age of First Use: 24 years of age 6 - Amount (size/oz): One bowl 2 - Frequency: Nightly 2 - Duration: ongoing 2 - Last Use / Amount: 09/04/19                Sleep: Good  Appetite:  Good  Current Medications: Current Facility-Administered Medications  Medication Dose Route Frequency Provider Last Rate Last Admin  . acetaminophen (TYLENOL) tablet 650 mg  650 mg Oral Q6H PRN Nira Conn A, NP   650 mg at 09/07/19 2036  . alum & mag hydroxide-simeth (MAALOX/MYLANTA) 200-200-20 MG/5ML suspension 30 mL  30 mL Oral Q4H PRN Nira Conn A, NP      . ARIPiprazole (ABILIFY) tablet 5 mg  5 mg Oral Daily Reizel Calzada, Rockey Situ, MD   5 mg at 09/08/19 0846  . [START ON 09/09/2019] buPROPion (WELLBUTRIN XL) 24 hr tablet 150 mg  150 mg Oral Daily Jackquline Branca A, MD      . famotidine (PEPCID) tablet 20 mg  20 mg Oral BID Nira Conn A, NP   20 mg at 09/08/19 0845  . hydrOXYzine (ATARAX/VISTARIL) tablet 25 mg  25 mg Oral Q6H PRN Lovenia Debruler, Rockey Situ, MD   25 mg at 09/07/19 2037  . lamoTRIgine (LAMICTAL) tablet 150 mg  150 mg Oral BID Nira Conn A, NP   150 mg at 09/08/19 0846  . LORazepam (ATIVAN) tablet 1 mg  1 mg Oral Q6H PRN Trygg Mantz A, MD      . magnesium hydroxide (MILK OF MAGNESIA) suspension 30 mL  30 mL Oral Daily PRN Nira Conn A, NP      .  multivitamin with minerals tablet 1 tablet  1 tablet Oral Daily Dejion Grillo, Rockey Situ, MD   1 tablet at 09/08/19 0845  . pantoprazole (PROTONIX) EC tablet 40 mg  40 mg Oral Daily Nira Conn A, NP   40 mg at 09/08/19 0846  . thiamine tablet 100 mg  100 mg Oral Daily Ivalee Strauser, Rockey Situ, MD   100 mg at 09/08/19 0848  . traZODone (DESYREL) tablet 50 mg  50 mg Oral QHS Merritt Mccravy, Rockey Situ, MD   50  mg at 09/07/19 2129    Lab Results:  Results for orders placed or performed during the hospital encounter of 09/06/19 (from the past 48 hour(s))  Respiratory Panel by RT PCR (Flu A&B, Covid) - Nasopharyngeal Swab     Status: None   Collection Time: 09/06/19 11:45 PM   Specimen: Nasopharyngeal Swab  Result Value Ref Range   SARS Coronavirus 2 by RT PCR NEGATIVE NEGATIVE    Comment: (NOTE) SARS-CoV-2 target nucleic acids are NOT DETECTED. The SARS-CoV-2 RNA is generally detectable in upper respiratoy specimens during the acute phase of infection. The lowest concentration of SARS-CoV-2 viral copies this assay can detect is 131 copies/mL. A negative result does not preclude SARS-Cov-2 infection and should not be used as the sole basis for treatment or other patient management decisions. A negative result may occur with  improper specimen collection/handling, submission of specimen other than nasopharyngeal swab, presence of viral mutation(s) within the areas targeted by this assay, and inadequate number of viral copies (<131 copies/mL). A negative result must be combined with clinical observations, patient history, and epidemiological information. The expected result is Negative. Fact Sheet for Patients:  https://www.moore.com/https://www.fda.gov/media/142436/download Fact Sheet for Healthcare Providers:  https://www.young.biz/https://www.fda.gov/media/142435/download This test is not yet ap proved or cleared by the [place] FDA and  has been authorized for detection and/or diagnosis of SARS-CoV-2 by FDA under an Emergency Use Authorization  (EUA). This EUA will remain  in effect (meaning this test can be used) for the duration of the COVID-19 declaration under Section 564(b)(1) of the Act, 21 U.S.C. section 360bbb-3(b)(1), unless the authorization is terminated or revoked sooner.    Influenza A by PCR NEGATIVE NEGATIVE   Influenza B by PCR NEGATIVE NEGATIVE    Comment: (NOTE) The Xpert Xpress SARS-CoV-2/FLU/RSV assay is intended as an aid in  the diagnosis of influenza from Nasopharyngeal swab specimens and  should not be used as a sole basis for treatment. Nasal washings and  aspirates are unacceptable for Xpert Xpress SARS-CoV-2/FLU/RSV  testing. Fact Sheet for Patients: https://www.moore.com/https://www.fda.gov/media/142436/download Fact Sheet for Healthcare Providers: https://www.young.biz/https://www.fda.gov/media/142435/download This test is not yet approved or cleared by the [place] FDA and  has been authorized for detection and/or diagnosis of SARS-CoV-2 by  FDA under an Emergency Use Authorization (EUA). This EUA will remain  in effect (meaning this test can be used) for the duration of the  Covid-19 declaration under Section 564(b)(1) of the Act, 21  U.S.C. section 360bbb-3(b)(1), unless the authorization is  terminated or revoked. Performed at [facility], [address] [place], [place] [address]   CBC     Status: Abnormal   Collection Time: 09/07/19  6:19 AM  Result Value Ref Range   WBC 15.3 (H) 4.0 - 10.5 K/uL   RBC 4.67 3.87 - 5.11 MIL/uL   Hemoglobin 11.0 (L) 12.0 - 15.0 g/dL   HCT 60.436.6 54.036.0 - 98.146.0 %   MCV 78.4 (L) 80.0 - 100.0 fL   MCH 23.6 (L) 26.0 - 34.0 pg   MCHC 30.1 30.0 - 36.0 g/dL   RDW 19.116.9 (H) 47.811.5 - 29.515.5 %   Platelets 473 (H) 150 - 400 K/uL   nRBC 0.0 0.0 - 0.2 %    Comment: Performed at [facility], [address] [place], [place] [address]  Comprehensive metabolic panel     Status: None   Collection Time: 09/07/19  6:19 AM  Result Value Ref Range   Sodium 137 135 - 145  mmol/L  Potassium 3.9 3.5 - 5.1 mmol/L   Chloride 103 98 - 111 mmol/L   CO2 24 22 - 32 mmol/L   Glucose, Bld 80 70 - 99 mg/dL   BUN 17 6 - 20 mg/dL   Creatinine, Ser 2.58 0.44 - 1.00 mg/dL   Calcium 9.2 8.9 - 52.7 mg/dL   Total Protein 7.5 6.5 - 8.1 g/dL   Albumin 4.4 3.5 - 5.0 g/dL   AST 18 15 - 41 U/L   ALT 18 0 - 44 U/L   Alkaline Phosphatase 89 38 - 126 U/L   Total Bilirubin 0.3 0.3 - 1.2 mg/dL   GFR calc non Af Amer >60 >60 mL/min   GFR calc Af Amer >60 >60 mL/min   Anion gap 10 5 - 15    Comment: Performed at [facility], [address] [place], [place] [address]  Hemoglobin A1c     Status: None   Collection Time: 09/07/19  6:19 AM  Result Value Ref Range   Hgb A1c MFr Bld 5.3 4.8 - 5.6 %    Comment: (NOTE)         Prediabetes: 5.7 - 6.4         Diabetes: >6.4         Glycemic control for adults with diabetes: <7.0    Mean Plasma Glucose 105 mg/dL    Comment: (NOTE) Performed At: [facility] [address] [place], [place] [id number] Jolene Schimke MD VQ:[id number]   Lipid panel     Status: None   Collection Time: 09/07/19  6:19 AM  Result Value Ref Range   Cholesterol 166 0 - 200 mg/dL   Triglycerides 72 <932 mg/dL   HDL 63 >67 mg/dL   Total CHOL/HDL Ratio 2.6 RATIO   VLDL 14 0 - 40 mg/dL   LDL Cholesterol 89 0 - 99 mg/dL    Comment:        Total Cholesterol/HDL:CHD Risk Coronary Heart Disease Risk Table                     Men   Women  1/2 Average Risk   3.4   3.3  Average Risk       5.0   4.4  2 X Average Risk   9.6   7.1  3 X Average Risk  23.4   11.0        Use the calculated Patient Ratio above and the CHD Risk Table to determine the patient's CHD Risk.        ATP III CLASSIFICATION (LDL):  <100     mg/dL   Optimal  124-580  mg/dL   Near or Above                    Optimal  130-159  mg/dL   Borderline  998-338  mg/dL   High  >250     mg/dL   Very High Performed at [facility], [address] [place], [place] [address]   TSH     Status: None   Collection Time: 09/07/19  6:19 AM  Result Value Ref Range   TSH 3.841 0.350 - 4.500 uIU/mL    Comment: Performed by a 3rd Generation assay with a functional sensitivity of <=0.01 uIU/mL. Performed at [facility], [address], [place], [place] [address]   Urine rapid drug screen (hosp performed)not at [facility]     Status: None   Collection  Time: 09/07/19  9:17 AM  Result Value Ref Range   Opiates NONE DETECTED NONE DETECTED   Cocaine NONE DETECTED NONE DETECTED   Benzodiazepines NONE DETECTED NONE DETECTED   Amphetamines NONE DETECTED NONE DETECTED   Tetrahydrocannabinol NONE DETECTED NONE DETECTED   Barbiturates NONE DETECTED NONE DETECTED    Comment: (NOTE) DRUG SCREEN FOR MEDICAL PURPOSES ONLY.  IF CONFIRMATION IS NEEDED FOR ANY PURPOSE, NOTIFY LAB WITHIN 5 DAYS. LOWEST DETECTABLE LIMITS FOR URINE DRUG SCREEN Drug Class                     Cutoff (ng/mL) Amphetamine and metabolites    1000 Barbiturate and metabolites    200 Benzodiazepine                 409 Tricyclics and metabolites     300 Opiates and metabolites        300 Cocaine and metabolites        300 THC                            50 Performed at [facility], [place] [address] [place], [place] [address]   Pregnancy, urine     Status: None   Collection Time: 09/07/19  9:17 AM  Result Value Ref Range   Preg Test, Ur NEGATIVE NEGATIVE    Comment:        THE SENSITIVITY OF THIS METHODOLOGY IS >20 mIU/mL. Performed at [facility], [place] [address], [place], [place] [address]     Blood Alcohol level:  Lab Results  Component Value Date   [facility] <11 68/34/1960    Metabolic Disorder Labs: Lab Results  Component Value Date   HGBA1C 5.3 09/07/2019   MPG 105 09/07/2019   MPG 120 09/01/2015   No results found for: PROLACTIN Lab Results  Component Value Date   CHOL 166 09/07/2019   TRIG 72 09/07/2019    HDL 63 09/07/2019   CHOLHDL 2.6 09/07/2019   VLDL 14 09/07/2019   LDLCALC 89 09/07/2019   LDLCALC 62 09/01/2015    Physical Findings: AIMS: Facial and Oral Movements Muscles of Facial Expression: None, normal Lips and Perioral Area: None, normal Jaw: None, normal Tongue: None, normal,Extremity Movements Upper (arms, wrists, hands, fingers): None, normal Lower (legs, knees, ankles, toes): None, normal, Trunk Movements Neck, shoulders, hips: None, normal, Overall Severity Severity of abnormal movements (highest score from questions above): None, normal Incapacitation due to abnormal movements: None, normal Patient's awareness of abnormal movements (rate only patient's report): No Awareness, Dental Status Current problems with teeth and/or dentures?: No Does patient usually wear dentures?: No  CIWA:  CIWA-Ar Total: 10 COWS:  COWS Total Score: 1  Musculoskeletal: Strength & Muscle Tone: within normal limits Gait & Station: normal Patient leans: N/A  Psychiatric Specialty Exam: Physical Exam  Nursing note and vitals reviewed. Constitutional: She is oriented to person, place, and time. She appears well-developed and well-nourished.  Cardiovascular: Normal rate.  Respiratory: Effort normal.  Neurological: She is alert and oriented to person, place, and time.    Review of Systems  Constitutional: Negative.   Respiratory: Negative for cough and shortness of breath.   Gastrointestinal: Negative for diarrhea, nausea and vomiting.  Neurological: Positive for headaches. Negative for dizziness, tremors and seizures.  Psychiatric/Behavioral: Positive for dysphoric mood. Negative for agitation, behavioral problems, hallucinations, self-injury, sleep disturbance and suicidal ideas. The patient is nervous/anxious. The  patient is not hyperactive.     Blood pressure 110/67, pulse 99, temperature 97.8 F (36.6 C), temperature source Oral, resp. rate 16, height 5\' 1"  (1.549 m), weight 88.9  kg, last menstrual period 09/07/2019, SpO2 100 %.Body mass index is 37.03 kg/m.  General Appearance: Fairly Groomed  Eye Contact:  Fair  Speech:  Normal Rate  Volume:  Normal  Mood:  Depressed  Affect:  Congruent  Thought Process:  Coherent  Orientation:  Full (Time, Place, and Person)  Thought Content:  Logical  Suicidal Thoughts:  No  Homicidal Thoughts:  No  Memory:  Immediate;   Good Recent;   Good Remote;   Good  Judgement:  Intact  Insight:  Fair  Psychomotor Activity:  Normal  Concentration:  Concentration: Good and Attention Span: Fair  Recall:  11/05/2019 of Knowledge:  Fair  Language:  Good  Akathisia:  No  Handed:  Right  AIMS (if indicated):     Assets:  Communication Skills Desire for Improvement Housing Physical Health Resilience  ADL's:  Intact  Cognition:  WNL  Sleep:  Number of Hours: 6.75     Treatment Plan Summary: Daily contact with patient to assess and evaluate symptoms and progress in treatment and Medication management   Continue inpatient hospitalization.  Start Wellbutrin XL 150 mg PO daily for depression Continue Ativan 1 mg PO Q6HR PRN CIWA>10 for ETOH withdrawal Continue Abilify 5 mg PO daily for mood Continue Lamictal 150 mg PO BID for mood Continue Pepcid 20 mg PO BID for GERD Continue Protonix 40 mg PO daily for GERD Continue thiamine 100 mg PO daily for supplementation Continue Vistaril 25 mg PO Q6HR PRN anxiety Continue trazodone 50 mg PO QHS PRN insomnia  Patient will participate in the therapeutic group milieu.  Discharge disposition in progress.   [employer], NP 09/08/2019, 10:58 AM   Agree with NP Note

## 2019-09-08 NOTE — Progress Notes (Signed)
D:  Patient's self inventory sheet, patient sleeps good, sleep medication helpful.  Good appetite, low energy level, poor concentration.  Rated depression and hopeless 7, anxiety 5.  Withdrawals, cramping, constipation.  Denied SI.  Physical problems, lightheaded, stomach feels tight.  Physical pain, worst pain #2, lower back, stomach.  Goal is make sure meds are right.  Plans to tell the psychiatrist how she is feeling.  No discharge plans. A:  Medications administered per MD orders.  Emotional support and encouragement given patient. R:  Denied SI and HI, contracts for safety.  Tommi Rumps

## 2019-09-08 NOTE — Progress Notes (Signed)
   09/07/19 2040  Psych Admission Type (Psych Patients Only)  Admission Status Voluntary  Psychosocial Assessment  Patient Complaints Depression;Sadness  Eye Contact Fair  Facial Expression Sad  Affect Depressed  Speech Logical/coherent  Interaction Assertive  Motor Activity Slow  Appearance/Hygiene Unremarkable  Behavior Characteristics Cooperative;Anxious  Mood Depressed;Sad  Aggressive Behavior  Effect No apparent injury  Thought Process  Coherency WDL  Content WDL  Delusions WDL  Perception WDL  Hallucination None reported or observed  Judgment WDL  Confusion None  Danger to Self  Current suicidal ideation? Passive  Self-Injurious Behavior No self-injurious ideation or behavior indicators observed or expressed   Agreement Not to Harm Self Yes  Description of Agreement Verbal contract  Danger to Others  Danger to Others None reported or observed   Pt presents with depressed mood. Endorses passive SI d/t feeling overwhelmed after speaking with MD this morning. States that she was forced to face truths about her past and present that she was in denial about, specifically how bad her drinking had become. Support given to patient and encouragement to use her time here to discover other coping mechanisms. Pt stated that group helped her to focus on something else and calmed her down. Pt denies HI, AVH. Pain 6/10 d/t withdrawal from EtOH. Pt contracts for safety.

## 2019-09-09 NOTE — Progress Notes (Signed)
[facility] MD Progress Note  09/09/2019 1:41 PM Chelsea Hoover  MRN:  [id number] Subjective: Patient describes persistent sense of sadness and depression.  Denies suicidal ideations.  Denies medication side effects at this time. Objective: I discussed case with treatment team and met with patient 24 year old single female, lives with parents.  History of mood disorder/depression.  Reported worsening depression over recent weeks.  States she suspects she had Covid infection several weeks ago (felt ill and several of her family members were positive, but she herself was not tested).  States that since then she has been feeling more depressed and that her psychiatric medications seem not to be working as they had.  Patient reports she had been drinking up to six beers or shots of liquor on most days of the week and has been experiencing alcohol-related blackouts.  She endorses contributing factors to include stressful home environment, relationship strain particularly with father, stressful job.  Today patient presents alert, attentive, calm.  At this time is not presenting with alcohol withdrawal symptoms.  No tremors, no diaphoresis, no psychomotor restlessness, no flushing or agitation, BP 104/74, pulse 98.. She reports she continues to feel depressed/sad, and her affect remains constricted denies suicidal ideations and contracts for safety on unit.  Remains ruminative regarding strained relationship with parents.  States she is angry with her parents as she recently found out that there is a history of alcoholism in the extended family which had not been shared with her.  States that if she had known said history she would have abstained  from alcohol.  Of note, patient answers 2 of 4 CAGE questions affirmatively At this time denies suicidal ideations and contracts for safety on unit. No disruptive or agitated behaviors on unit.    Principal Problem: Depression, Alcohol Use Disorder  Diagnosis: Active  Problems:   Bipolar 1 disorder (HCC)  Total Time spent with patient: 20 minutes  Past Psychiatric History: See admission H&P  Past Medical History:  Past Medical History:  Diagnosis Date  . Anxiety   . 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 Bipolar 1 disorder ([place])   . Cannabis abuse 09/03/2015  . Depression   . Irritable bowel syndrome   . Lyme disease   . Mood disorder ([place])   . PTSD (post-traumatic stress disorder) 09/01/2015   History reviewed. No pertinent surgical history. Family History:  Family History  Problem Relation Age of Onset  . Crohn's disease Father    Family Psychiatric  History: See admission H&P Social History:  Social History   Substance and Sexual Activity  Alcohol Use No     Social History   Substance and Sexual Activity  Drug Use Yes  . Types: Marijuana   Comment: Few times a week    Social History   Socioeconomic History  . Marital status: Single    Spouse name: Not on file  . Number of children: Not on file  . Years of education: Not on file  . Highest education level: Not on file  Occupational History  . Not on file  Tobacco Use  . Smoking status: Never Smoker  . Smokeless tobacco: Never Used  Substance and Sexual Activity  . Alcohol use: No  . Drug use: Yes    Types: Marijuana    Comment: Few times a week  . Sexual activity: Yes    Birth control/protection: Pill  Other Topics Concern  . Not on file  Social History Narrative  . Not on file  Social Determinants of Health   Financial Resource Strain:   . Difficulty of Paying Living Expenses: Not on file  Food Insecurity:   . Worried About Charity fundraiser in the Last Year: Not on file  . Ran Out of Food in the Last Year: Not on file  Transportation Needs:   . Lack of Transportation (Medical): Not on file  . Lack of Transportation (Non-Medical): Not on file  Physical Activity:   . Days of Exercise per Week: Not on file  . Minutes of Exercise per Session: Not  on file  Stress:   . Feeling of Stress : Not on file  Social Connections:   . Frequency of Communication with Friends and Family: Not on file  . Frequency of Social Gatherings with Friends and Family: Not on file  . Attends Religious Services: Not on file  . Active Member of Clubs or Organizations: Not on file  . Attends Archivist Meetings: Not on file  . Marital Status: Not on file   Additional Social History:    Pain Medications: None Prescriptions: Welbutrin, Lamictal, Trazadone, Amogadone Over the Counter: Stomach acid reducer (hx of IBS) History of alcohol / drug use?: Yes Name of Substance 1: ETOH (beer & liquor) 1 - Age of First Use: 24 years of age 72 - Amount (size/oz): 4 beers and some liquor 1 - Frequency: Daily 1 - Duration: Over the last few weeks 1 - Last Use / Amount: 09/04/19 Name of Substance 2: Marijuana 2 - Age of First Use: 24 years of age 61 - Amount (size/oz): One bowl 2 - Frequency: Nightly 2 - Duration: ongoing 2 - Last Use / Amount: 09/04/19  Sleep: Good  Appetite:  Good  Current Medications: Current Facility-Administered Medications  Medication Dose Route Frequency Provider Last Rate Last Admin  . acetaminophen (TYLENOL) tablet 650 mg  650 mg Oral Q6H PRN Lindon Romp A, NP   650 mg at 09/07/19 2036  . alum & mag hydroxide-simeth (MAALOX/MYLANTA) 200-200-20 MG/5ML suspension 30 mL  30 mL Oral Q4H PRN Lindon Romp A, NP      . ARIPiprazole (ABILIFY) tablet 5 mg  5 mg Oral Daily Jasman Murri, Myer Peer, MD   5 mg at 09/09/19 0813  . buPROPion (WELLBUTRIN XL) 24 hr tablet 150 mg  150 mg Oral Daily Tiaria Biby, Myer Peer, MD   150 mg at 09/09/19 0815  . famotidine (PEPCID) tablet 20 mg  20 mg Oral BID Lindon Romp A, NP   20 mg at 09/09/19 0813  . hydrOXYzine (ATARAX/VISTARIL) tablet 25 mg  25 mg Oral Q6H PRN Anavey Coombes, Myer Peer, MD   25 mg at 09/07/19 2037  . lamoTRIgine (LAMICTAL) tablet 150 mg  150 mg Oral BID Lindon Romp A, NP   150 mg at 09/09/19  0813  . LORazepam (ATIVAN) tablet 1 mg  1 mg Oral Q6H PRN Alexandr Yaworski A, MD      . magnesium hydroxide (MILK OF MAGNESIA) suspension 30 mL  30 mL Oral Daily PRN Lindon Romp A, NP      . multivitamin with minerals tablet 1 tablet  1 tablet Oral Daily Tashawna Thom, Myer Peer, MD   1 tablet at 09/09/19 0813  . pantoprazole (PROTONIX) EC tablet 40 mg  40 mg Oral Daily Lindon Romp A, NP   40 mg at 09/09/19 0813  . thiamine tablet 100 mg  100 mg Oral Daily Trygve Thal, Myer Peer, MD   100 mg at 09/09/19 0813  .  traZODone (DESYREL) tablet 50 mg  50 mg Oral QHS Debar Plate, Myer Peer, MD   50 mg at 09/08/19 2133    Lab Results:  No results found for this or any previous visit (from the past 48 hour(s)).  Blood Alcohol level:  Lab Results  Component Value Date   [facility] <11 92/92/4462    Metabolic Disorder Labs: Lab Results  Component Value Date   HGBA1C 5.3 09/07/2019   MPG 105 09/07/2019   MPG 120 09/01/2015   No results found for: PROLACTIN Lab Results  Component Value Date   CHOL 166 09/07/2019   TRIG 72 09/07/2019   HDL 63 09/07/2019   CHOLHDL 2.6 09/07/2019   VLDL 14 09/07/2019   LDLCALC 89 09/07/2019   LDLCALC 62 09/01/2015    Physical Findings: AIMS: Facial and Oral Movements Muscles of Facial Expression: None, normal Lips and Perioral Area: None, normal Jaw: None, normal Tongue: None, normal,Extremity Movements Upper (arms, wrists, hands, fingers): None, normal Lower (legs, knees, ankles, toes): None, normal, Trunk Movements Neck, shoulders, hips: None, normal, Overall Severity Severity of abnormal movements (highest score from questions above): None, normal Incapacitation due to abnormal movements: None, normal Patient's awareness of abnormal movements (rate only patient's report): No Awareness, Dental Status Current problems with teeth and/or dentures?: No Does patient usually wear dentures?: No  CIWA:  CIWA-Ar Total: 1 COWS:  COWS Total Score: 1  Musculoskeletal: Strength  & Muscle Tone: within normal limits Gait & Station: normal Patient leans: N/A  Psychiatric Specialty Exam: Physical Exam  Nursing note and vitals reviewed. Constitutional: She is oriented to person, place, and time. She appears well-developed and well-nourished.  Cardiovascular: Normal rate.  Respiratory: Effort normal.  Neurological: She is alert and oriented to person, place, and time.    Review of Systems  Constitutional: Negative.   Respiratory: Negative for cough and shortness of breath.   Gastrointestinal: Negative for diarrhea, nausea and vomiting.  Neurological: Positive for headaches. Negative for dizziness, tremors and seizures.  Psychiatric/Behavioral: Positive for dysphoric mood. Negative for agitation, behavioral problems, hallucinations, self-injury, sleep disturbance and suicidal ideas. The patient is nervous/anxious. The patient is not hyperactive.   Denies headache, no chest pain, no shortness of breath, no vomiting  Blood pressure 104/74, pulse 98, temperature 98 F (36.7 C), temperature source Oral, resp. rate 16, height '5\' 1"'$  (1.549 m), weight 88.9 kg, last menstrual period 09/07/2019, SpO2 100 %.Body mass index is 37.03 kg/m.  General Appearance: Fairly Groomed  Eye Contact:  Fair/improves during session  Speech:  Normal Rate  Volume:  Normal  Mood:  Reports persistent depression/vague sense of sadness, does acknowledge some improvement compared to admission  Affect:  Constricted although smiles at times appropriately during session  Thought Process:  Linear and Descriptions of Associations: Intact  Orientation:  Other:  Fully alert and attentive  Thought Content:  No hallucinations, no delusions, ruminative  Suicidal Thoughts:  No denies suicidal ideations at this time and contracts for safety on unit  Homicidal Thoughts:  No  Memory:  Recent and remote grossly intact  Judgement:  Other:  Improving  Insight:  Fair/improving  Psychomotor Activity:  Normal-no  tremors, no diaphoresis, no psychomotor restlessness  Concentration:  Concentration: Good and Attention Span: Good  Recall:  Good  Fund of Knowledge:  Good  Language:  Good  Akathisia:  No  Handed:  Right  AIMS (if indicated):     Assets:  Communication Skills Desire for Improvement Housing Physical Health Resilience  ADL's:  Intact  Cognition:  WNL  Sleep:  Number of Hours: 6.25   Assessment:  24 year old single female, lives with parents.  History of mood disorder/depression.  Reported worsening depression over recent weeks.  States she suspects she had Covid infection several weeks ago (felt ill and several of her family members were positive, but she herself was not tested).  States that since then she has been feeling more depressed and that her psychiatric medications seem not to be working as they had.  Patient reports she had been drinking up to six beers or shots of liquor on most days of the week and has been experiencing alcohol-related blackouts.  She endorses contributing factors to include stressful home environment, relationship strain particularly with father, stressful job.  Patient remains depressed, constricted in affect, describing lingering symptoms of depression.  Denies SI and contracts for safety on unit.  Has endorsed daily/regular alcohol consumption prior to admission-currently she is not presenting with symptoms of withdrawal.  She has a history of good response to Wellbutrin XL, which she has been taking at 150 mg daily.  She has no history of seizures.  We have discussed options , and she prefers to continue Wellbutrin trial, as it has been effective and well tolerated .   Treatment Plan Summary: Daily contact with patient to assess and evaluate symptoms and progress in treatment and Medication management  Treatment plan reviewed as below today 1/7 Encourage group and milieu participation Encourage efforts to work on sobriety and relapse prevention Resume  Wellbutrin XL 150 mgrs QDAY for depression Continue Abilify 5 mgrs QDAY for mood disorder  Continue Ativan 1 mg PO Q6HR PRN CIWA>10 for ETOH withdrawal Continue Lamictal 150 mg PO BID for mood disorder Continue Pepcid 20 mg PO BID for GERD Continue Protonix 40 mg PO daily for GERD Continue thiamine 100 mg PO daily for supplementation Continue Vistaril 25 mg PO Q6HR PRN anxiety Continue Trazodone 50 mg PO QHS PRN insomnia    Jenne Campus, MD 09/09/2019, 1:41 PM   Patient ID: Chelsea Hoover, female   DOB: [birth year], 24 y.o.   MRN: [id number]

## 2019-09-09 NOTE — Progress Notes (Signed)
   09/09/19 2156  Psych Admission Type (Psych Patients Only)  Admission Status Voluntary  Psychosocial Assessment  Patient Complaints Depression  Eye Contact Fair  Facial Expression Sad  Affect Depressed  Speech Logical/coherent  Interaction Assertive  Motor Activity Slow  Appearance/Hygiene Unremarkable  Behavior Characteristics Cooperative;Anxious  Mood Depressed;Sad  Aggressive Behavior  Effect No apparent injury  Thought Process  Coherency WDL  Content WDL  Delusions WDL  Perception WDL  Hallucination None reported or observed  Judgment WDL  Confusion None  Danger to Self  Current suicidal ideation? Denies  Agreement Not to Harm Self Yes  Description of Agreement verbal contract  Danger to Others  Danger to Others None reported or observed   Pt states that she was having passive SI earlier today. She was thinking about the return to her home situation with her parents. Has some insight into her situation - will start planning how to get into another living situation and remove that stressor from her life. Pt denies SI, HI, AVH and pain. Contracts for safety.

## 2019-09-09 NOTE — Progress Notes (Signed)
   09/09/19 0107  Psych Admission Type (Psych Patients Only)  Admission Status Voluntary  Psychosocial Assessment  Patient Complaints Anxiety  Eye Contact Fair  Facial Expression Sad  Affect Depressed  Speech Logical/coherent  Interaction Assertive  Motor Activity Slow  Appearance/Hygiene Unremarkable  Behavior Characteristics Cooperative;Calm  Mood Pleasant  Aggressive Behavior  Effect No apparent injury  Thought Process  Coherency WDL  Content WDL  Delusions WDL  Perception WDL  Hallucination None reported or observed  Judgment WDL  Confusion None  Danger to Self  Current suicidal ideation? Denies  Description of Suicide Plan verbal  Self-Injurious Behavior No self-injurious ideation or behavior indicators observed or expressed   Agreement Not to Harm Self Yes  Description of Agreement Verbal contract  Danger to Others  Danger to Others None reported or observed

## 2019-09-09 NOTE — Progress Notes (Signed)
    09/09/19 1000  Psych Admission Type (Psych Patients Only)  Admission Status Voluntary  Psychosocial Assessment  Patient Complaints Depression  Eye Contact Fair  Facial Expression Sad  Affect Depressed  Speech Logical/coherent  Interaction Assertive  Motor Activity Slow  Appearance/Hygiene Unremarkable  Behavior Characteristics Cooperative  Mood Depressed  Aggressive Behavior  Effect No apparent injury  Thought Process  Coherency WDL  Content WDL  Delusions WDL  Perception WDL  Hallucination None reported or observed  Judgment WDL  Confusion None  Danger to Self  Current suicidal ideation? Denies  Self-Injurious Behavior No self-injurious ideation or behavior indicators observed or expressed   Agreement Not to Harm Self Yes  Description of Agreement Verbal contract  Danger to Others  Danger to Others None reported or observed

## 2019-09-10 MED ORDER — AVEENO SOOTHING BATH TREATMENT EX PACK
1.0000 | PACK | CUTANEOUS | Status: DC | PRN
  Filled 2019-09-10: qty 1

## 2019-09-10 MED ORDER — BUPROPION HCL ER (XL) 150 MG PO TB24
150.0000 mg | ORAL_TABLET | Freq: Once | ORAL | Status: AC
  Administered 2019-09-10: 13:00:00 150 mg via ORAL
  Filled 2019-09-10: qty 1

## 2019-09-10 MED ORDER — BUPROPION HCL ER (XL) 300 MG PO TB24
300.0000 mg | ORAL_TABLET | Freq: Every day | ORAL | Status: DC
  Filled 2019-09-10 (×2): qty 1

## 2019-09-10 NOTE — Progress Notes (Signed)
Recreation Therapy Notes  Date:  1.8.21 Time: 0930 Location: [address] Dayroom  Group Topic: Stress Management  Goal Area(s) Addresses:  Patient will identify positive stress management techniques. Patient will identify benefits of using stress management post d/c.  Behavioral Response:  Engaged  Intervention: Stress Management  Activity :  Meditation.  LRT played a meditation that focused on choices.  Patients were to listen and follow along as meditation played to engage.  Education:  Stress Management, Discharge Planning.   Education Outcome: Acknowledges Education  Clinical Observations/Feedback: Pt attended and participated in activity.    Annalaya Wile, LRT/CTRS        Callaway Hailes A 09/10/2019 11:06 AM 

## 2019-09-10 NOTE — Progress Notes (Signed)
Patient reports feeling increased anxiety and not sure if its because of the medicationor her missing her parents.    09/10/19 2025  Psych Admission Type (Psych Patients Only)  Admission Status Voluntary  Psychosocial Assessment  Patient Complaints Anxiety;Depression  Eye Contact Fair  Facial Expression Sad  Affect Depressed  Speech Logical/coherent  Interaction Assertive  Motor Activity Slow  Appearance/Hygiene Unremarkable  Behavior Characteristics Cooperative;Appropriate to situation  Mood Depressed;Anxious  Aggressive Behavior  Effect No apparent injury  Thought Process  Coherency WDL  Content WDL  Delusions WDL  Perception WDL  Hallucination None reported or observed  Judgment WDL  Confusion None  Danger to Self  Current suicidal ideation? Denies  Self-Injurious Behavior No self-injurious ideation or behavior indicators observed or expressed   Agreement Not to Harm Self Yes  Description of Agreement verbal contract  Danger to Others  Danger to Others None reported or observed

## 2019-09-10 NOTE — Progress Notes (Signed)
Adult Psychoeducational Group Note  Date:  09/10/2019 Time:  9:47 PM  Group Topic/Focus:  Wrap-Up Group:   The focus of this group is to help patients review their daily goal of treatment and discuss progress on daily workbooks.  Participation Level:  Minimal  Participation Quality:  Appropriate  Affect:  Appropriate  Cognitive:  Appropriate  Insight: Appropriate  Engagement in Group:  Engaged  Modes of Intervention:  Limit-setting  Additional Comments:  Pt said her day was a 4. Pt said she do not remember her goal maybe coping skills.  She enjoyed clo and coloring working on puzzle. The coping skills she found to be helpful breathing  Exercise, talking with other people in the day room with similar experiences. Pt said she really want to go to the gym  or outside. She struggle with body image. Pt said she had an eatting disorder in the past. Pt said she do not want to relaspse..**Charna Busman Long 09/10/2019, 9:47 PM

## 2019-09-10 NOTE — Progress Notes (Addendum)
   09/10/19 0900  Psych Admission Type (Psych Patients Only)  Admission Status Voluntary  Psychosocial Assessment  Patient Complaints Anxiety;Depression  Eye Contact Fair  Facial Expression Sad  Affect Depressed  Speech Logical/coherent  Interaction Assertive  Motor Activity Slow  Appearance/Hygiene Unremarkable  Behavior Characteristics Cooperative;Appropriate to situation  Mood Depressed;Anxious  Aggressive Behavior  Effect No apparent injury  Thought Process  Coherency WDL  Content WDL  Delusions WDL  Perception WDL  Hallucination None reported or observed  Judgment WDL  Confusion None  Danger to Self  Current suicidal ideation? Denies  Self-Injurious Behavior No self-injurious ideation or behavior indicators observed or expressed   Agreement Not to Harm Self Yes  Description of Agreement verbal contract  Danger to Others  Danger to Others None reported or observed    Per pt's self inventory, pt rated her depression, hopelessness and anxiety all 8's. Pt writes that her most important goal today is "to find/list activities that I like to do that could be possible alternatives/coping skills". Pt reports that she has occasional 'thoughts', and states that "my home situation contributes to my suicidal ideation". Pt verbally contracts to contact staff before acting on any thoughts of self harm.
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
## 2019-09-11 MED ORDER — BUPROPION HCL ER (XL) 150 MG PO TB24
150.0000 mg | ORAL_TABLET | Freq: Every day | ORAL | Status: DC
  Administered 2019-09-12 – 2019-09-13 (×2): 150 mg via ORAL
  Filled 2019-09-11 (×3): qty 1

## 2019-09-11 MED ORDER — ARIPIPRAZOLE 10 MG PO TABS
10.0000 mg | ORAL_TABLET | Freq: Every day | ORAL | Status: DC
  Administered 2019-09-12 – 2019-09-13 (×2): 10 mg via ORAL
  Filled 2019-09-11 (×3): qty 1

## 2019-09-11 NOTE — [facility] Group Notes (Signed)
LCSW Group Therapy Note  09/11/2019   10:00-11:00am   Type of Therapy and Topic:  Group Therapy: Anger Cues and Responses  Participation Level:  Active   Description of Group:   In this group, patients learned how to recognize the physical, cognitive, emotional, and behavioral responses they have to anger-provoking situations.  They identified a recent time they became angry and how they reacted.  They analyzed how their reaction was possibly beneficial and how it was possibly unhelpful.  The group discussed a variety of healthier coping skills that could help with such a situation in the future.  Focus was placed on how helpful it is to recognize the underlying emotions to our anger, because working on those can lead to a more permanent solution as well as our ability to focus on the important rather than the urgent.  Therapeutic Goals: Patients will remember their last incident of anger and how they felt emotionally and physically, what their thoughts were at the time, and how they behaved. Patients will identify how their behavior at that time worked for them, as well as how it worked against them. Patients will explore possible new behaviors to use in future anger situations. Patients will learn that anger itself is normal and cannot be eliminated, and that healthier reactions can assist with resolving conflict rather than worsening situations.  Summary of Patient Progress:  The patient shared that her most recent time of anger was earlier today when she was annoyed but she did not want to talk about it because it involved somebody else in the room.  She said her most recent time of significant anger was yesterday when she was on the phone with her mother and her mother started yelling at her when she informed mom she was withdrawing from alcohol.  The action she took was to tell mother that she did not call to be yelled at.  She participated actively throughout group and showed insight  development.  Therapeutic Modalities:   Cognitive Behavioral Therapy  Lynnell Chad

## 2019-09-11 NOTE — Progress Notes (Addendum)
[facility] Group Notes:  (Nursing/MHT/Case Management/Adjunct)  Date:  09/11/2019  Time: 2030 Type of Therapy:  wrap up group  Participation Level:  Active  Participation Quality:  Appropriate, Attentive, Sharing and Supportive  Affect:  Anxious and Depressed  Cognitive:  Appropriate  Insight:  Improving  Engagement in Group:  Engaged  Modes of Intervention:  Clarification, Education and Support  Summary of Progress/Problems: Pt shared that she is glad she has a new roommate and thinks she will get more rest tonight. Pt plans on finding a place to live but states the social worker doesn't have resources that fit her need. Pt is grateful for her boyfriend.   Marcille Buffy 09/11/2019, 9:14 PM

## 2019-09-11 NOTE — Progress Notes (Addendum)
   09/11/19 1200  Psych Admission Type (Psych Patients Only)  Admission Status Voluntary  Psychosocial Assessment  Patient Complaints Anxiety  Eye Contact Fair  Facial Expression Sad  Affect Depressed  Speech Logical/coherent  Interaction Assertive  Motor Activity Slow  Appearance/Hygiene Unremarkable  Behavior Characteristics Cooperative;Anxious  Mood Anxious  Aggressive Behavior  Effect No apparent injury  Thought Process  Coherency WDL  Content WDL  Delusions WDL  Perception WDL  Hallucination None reported or observed  Judgment WDL  Confusion None  Danger to Self  Current suicidal ideation? Denies  Self-Injurious Behavior No self-injurious ideation or behavior indicators observed or expressed   Agreement Not to Harm Self Yes  Description of Agreement verbal contract  Danger to Others  Danger to Others None reported or observed

## 2019-09-11 NOTE — Progress Notes (Signed)
[facility] MD Progress Note  09/11/2019 12:00 PM Chelsea Hoover  MRN:  [id number] Subjective: Patient reports some persistent depression/sadness. Denies suicidal ideations at this time. She also describes a vague sense of anxiety/agitation which she attributes to Wellbutrin XL dose titration. States " I remember now that I was on 300 mgrs once before and it made me feel bad ( jittery) so the dose was cut back down". Of note she does not endorse any clear akathisia and does not appear restless or psychomotorically agitated at this time. Objective: I discussed case with treatment team and met with patient 24 year old single female, lives with parents.  History of mood disorder/depression.  Reported worsening depression over recent weeks.  States she suspects she had Covid infection several weeks ago (felt ill and several of her family members were positive, but she herself was not tested).  States that since then she has been feeling more depressed and that her psychiatric medications seem not to be working as they had.  Patient reports she had been drinking up to six beers or shots of liquor on most days of the week and has been experiencing alcohol-related blackouts.  She endorses contributing factors to include stressful home environment, relationship strain particularly with father, stressful job.  Patient continues to report depression, sadness and today endorses intermittent passive thoughts of death, but without any actual suicidal/self injurious  plans or intention and contracts for safety on unit.  No psychotic symptoms. As above, reports feeling vaguely anxious/ jittery after Wellbutrin XL was titrated from 150 to 300 mgrs daily and states she remembers she had similar reaction in the past following a dose titration. She reports she tolerates the 150 mgr daily dose well , without side effects. No current akathisia noted , no psychomotor agitation. Polite on approach. Affect constricted but improves  partially during session. She does state her boyfriend is very supportive of her and that his visits and calls have helped improve her mood .      Principal Problem: Depression, Alcohol Use Disorder  Diagnosis: Active Problems:   Bipolar 1 disorder (HCC)  Total Time spent with patient: 20 minutes  Past Psychiatric History: See admission H&P  Past Medical History:  Past Medical History:  Diagnosis Date  . Anxiety   . 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 Bipolar 1 disorder ([place])   . Cannabis abuse 09/03/2015  . Depression   . Irritable bowel syndrome   . Lyme disease   . Mood disorder ([place])   . PTSD (post-traumatic stress disorder) 09/01/2015   History reviewed. No pertinent surgical history. Family History:  Family History  Problem Relation Age of Onset  . Crohn's disease Father    Family Psychiatric  History: See admission H&P Social History:  Social History   Substance and Sexual Activity  Alcohol Use No     Social History   Substance and Sexual Activity  Drug Use Yes  . Types: Marijuana   Comment: Few times a week    Social History   Socioeconomic History  . Marital status: Single    Spouse name: Not on file  . Number of children: Not on file  . Years of education: Not on file  . Highest education level: Not on file  Occupational History  . Not on file  Tobacco Use  . Smoking status: Never Smoker  . Smokeless tobacco: Never Used  Substance and Sexual Activity  . Alcohol use: No  . Drug use: Yes    Types: Marijuana  Comment: Few times a week  . Sexual activity: Yes    Birth control/protection: Pill  Other Topics Concern  . Not on file  Social History Narrative  . Not on file   Social Determinants of Health   Financial Resource Strain:   . Difficulty of Paying Living Expenses: Not on file  Food Insecurity:   . Worried About Charity fundraiser in the Last Year: Not on file  . Ran Out of Food in the Last Year: Not on file   Transportation Needs:   . Lack of Transportation (Medical): Not on file  . Lack of Transportation (Non-Medical): Not on file  Physical Activity:   . Days of Exercise per Week: Not on file  . Minutes of Exercise per Session: Not on file  Stress:   . Feeling of Stress : Not on file  Social Connections:   . Frequency of Communication with Friends and Family: Not on file  . Frequency of Social Gatherings with Friends and Family: Not on file  . Attends Religious Services: Not on file  . Active Member of Clubs or Organizations: Not on file  . Attends Archivist Meetings: Not on file  . Marital Status: Not on file   Additional Social History:    Pain Medications: None Prescriptions: Welbutrin, Lamictal, Trazadone, Amogadone Over the Counter: Stomach acid reducer (hx of IBS) History of alcohol / drug use?: Yes Name of Substance 1: ETOH (beer & liquor) 1 - Age of First Use: 24 years of age 2 - Amount (size/oz): 4 beers and some liquor 1 - Frequency: Daily 1 - Duration: Over the last few weeks 1 - Last Use / Amount: 09/04/19 Name of Substance 2: Marijuana 2 - Age of First Use: 24 years of age 63 - Amount (size/oz): One bowl 2 - Frequency: Nightly 2 - Duration: ongoing 2 - Last Use / Amount: 09/04/19  Sleep: Good  Appetite:  Good  Current Medications: Current Facility-Administered Medications  Medication Dose Route Frequency Provider Last Rate Last Admin  . acetaminophen (TYLENOL) tablet 650 mg  650 mg Oral Q6H PRN Lindon Romp A, NP   650 mg at 09/07/19 2036  . alum & mag hydroxide-simeth (MAALOX/MYLANTA) 200-200-20 MG/5ML suspension 30 mL  30 mL Oral Q4H PRN Rozetta Nunnery, NP      . Derrill Memo ON 09/12/2019] ARIPiprazole (ABILIFY) tablet 10 mg  10 mg Oral Daily Haeli Gerlich, Myer Peer, MD      . [place] Soothing Bath Treatment 1 packet  1 packet Topical PRN Rozetta Nunnery, NP      . Derrill Memo ON 09/12/2019] buPROPion (WELLBUTRIN XL) 24 hr tablet 150 mg  150 mg Oral Daily Katai Marsico,  Seldon Barrell A, MD      . famotidine (PEPCID) tablet 20 mg  20 mg Oral BID Lindon Romp A, NP   20 mg at 09/11/19 0843  . lamoTRIgine (LAMICTAL) tablet 150 mg  150 mg Oral BID Lindon Romp A, NP   150 mg at 09/11/19 0843  . magnesium hydroxide (MILK OF MAGNESIA) suspension 30 mL  30 mL Oral Daily PRN Lindon Romp A, NP      . multivitamin with minerals tablet 1 tablet  1 tablet Oral Daily Rayna Brenner, Myer Peer, MD   1 tablet at 09/11/19 0843  . pantoprazole (PROTONIX) EC tablet 40 mg  40 mg Oral Daily Lindon Romp A, NP   40 mg at 09/11/19 0843  . thiamine tablet 100 mg  100 mg Oral Daily Charlotte Fidalgo,  Myer Peer, MD   100 mg at 09/11/19 0843  . traZODone (DESYREL) tablet 50 mg  50 mg Oral QHS Mayah Urquidi, Myer Peer, MD   50 mg at 09/10/19 2105    Lab Results:  No results found for this or any previous visit (from the past 48 hour(s)).  Blood Alcohol level:  Lab Results  Component Value Date   [facility] <11 81/44/8185    Metabolic Disorder Labs: Lab Results  Component Value Date   HGBA1C 5.3 09/07/2019   MPG 105 09/07/2019   MPG 120 09/01/2015   No results found for: PROLACTIN Lab Results  Component Value Date   CHOL 166 09/07/2019   TRIG 72 09/07/2019   HDL 63 09/07/2019   CHOLHDL 2.6 09/07/2019   VLDL 14 09/07/2019   LDLCALC 89 09/07/2019   LDLCALC 62 09/01/2015    Physical Findings: AIMS: Facial and Oral Movements Muscles of Facial Expression: None, normal Lips and Perioral Area: None, normal Jaw: None, normal Tongue: None, normal,Extremity Movements Upper (arms, wrists, hands, fingers): None, normal Lower (legs, knees, ankles, toes): None, normal, Trunk Movements Neck, shoulders, hips: None, normal, Overall Severity Severity of abnormal movements (highest score from questions above): None, normal Incapacitation due to abnormal movements: None, normal Patient's awareness of abnormal movements (rate only patient's report): No Awareness, Dental Status Current problems with teeth and/or  dentures?: No Does patient usually wear dentures?: No  CIWA:  CIWA-Ar Total: 1 COWS:  COWS Total Score: 1  Musculoskeletal: Strength & Muscle Tone: within normal limits Gait & Station: normal Patient leans: N/A  Psychiatric Specialty Exam: Physical Exam  Nursing note and vitals reviewed. Constitutional: She is oriented to person, place, and time. She appears well-developed and well-nourished.  Cardiovascular: Normal rate.  Respiratory: Effort normal.  Neurological: She is alert and oriented to person, place, and time.    Review of Systems  Constitutional: Negative.   Respiratory: Negative for cough and shortness of breath.   Gastrointestinal: Negative for diarrhea, nausea and vomiting.  Neurological: Positive for headaches. Negative for dizziness, tremors and seizures.  Psychiatric/Behavioral: Positive for dysphoric mood. Negative for agitation, behavioral problems, hallucinations, self-injury, sleep disturbance and suicidal ideas. The patient is nervous/anxious. The patient is not hyperactive.   denies chest pain , denies shortness of breath, no cough. No rash  Blood pressure 117/75, pulse 80, temperature 99.1 F (37.3 C), temperature source Oral, resp. rate 16, height '5\' 1"'$  (1.549 m), weight 88.9 kg, last menstrual period 09/07/2019, SpO2 100 %.Body mass index is 37.03 kg/m.  General Appearance: improving grooming   Eye Contact:  Good   Speech:  Normal Rate  Volume:  Normal  Mood:  remains depressed  Affect:  constricted, sad, brightens partially during session  Thought Process:  Linear and Descriptions of Associations: Intact  Orientation:  Other:  Fully alert and attentive  Thought Content:  No hallucinations, no delusions, ruminative  Suicidal Thoughts:  Yes.  without intent/plan denies self injurious or suicidal ideations at this time and contracts for safety on unit  Homicidal Thoughts:  No  Memory:  Recent and remote grossly intact  Judgement:  Other:  Improving   Insight:  Fair/improving  Psychomotor Activity:  Normal-no tremors, no diaphoresis, no psychomotor restlessness  Concentration:  Concentration: Good and Attention Span: Good  Recall:  Good  Fund of Knowledge:  Good  Language:  Good  Akathisia:  No  Handed:  Right  AIMS (if indicated):     Assets:  Communication Skills Desire for Improvement Housing  Physical Health Resilience  ADL's:  Intact  Cognition:  WNL  Sleep:  Number of Hours: 5.75   Assessment:  24 year old single female, lives with parents.  History of mood disorder/depression.  Reported worsening depression over recent weeks.  States she suspects she had Covid infection several weeks ago (felt ill and several of her family members were positive, but she herself was not tested).  States that since then she has been feeling more depressed and that her psychiatric medications seem not to be working as they had.  Patient reports she had been drinking up to six beers or shots of liquor on most days of the week and has been experiencing alcohol-related blackouts.  She endorses contributing factors to include stressful home environment, relationship strain particularly with father, stressful job.  Patient continues to present depressed, with a constricted affect and endorses intermittent passive thoughts of death/dying but without suicidal plan or intention and currently contracts for safety. She does present with improving grooming and eye contact and reports that her affect brightens during visits with BF. She reports increased in Wellbutrin XL dose from 150 to 300 mgrs daily has resulted in a vague but unpleasant feeling of jitteriness , and remembers having had similar side effect during a prior dose escalation. She does feel Wellbutrin has been helpful /well tolerated and prefers to continue this medication rather than trying another one at this time. She does not appear akathetic and feels Abilify is " helping a little". We have again  reviewed psychiatric history and she corroborates history of brief hypomanic episodes with prior diagnosis of Bipolar Disorder .    Treatment Plan Summary: Daily contact with patient to assess and evaluate symptoms and progress in treatment and Medication management  Treatment plan reviewed as below today 1/9 Encourage group and milieu participation Encourage efforts to work on sobriety and relapse prevention Decrease Wellbutrin XL back to 150 mgrs QDAY for depression Increase Abilify to 10 mgrs QDAY for mood disorder  Continue Lamictal 150 mg PO BID for mood disorder Continue Pepcid 20 mg PO BID for GERD Continue Protonix 40 mg PO daily for GERD Continue thiamine 100 mg PO daily for supplementation Continue Vistaril 25 mg PO Q6HR PRN anxiety Continue Trazodone 50 mg PO QHS PRN insomnia    Jenne Campus, MD 09/11/2019, 12:00 PM   Patient ID: Chelsea Hoover, female   DOB: [birth year], 24 y.o.   MRN: [id number]

## 2019-09-12 MED ORDER — TRAZODONE HCL 50 MG PO TABS
50.0000 mg | ORAL_TABLET | Freq: Every evening | ORAL | Status: DC | PRN
  Administered 2019-09-12: 21:00:00 50 mg via ORAL
  Filled 2019-09-12: qty 1

## 2019-09-12 NOTE — [facility] Group Notes (Signed)
[facility] LCSW Group Therapy Note  09/12/2019  10:00-11:00AM  Type of Therapy and Topic:  Group Therapy:  Adding Supports Including Being Your Own Support  Participation Level:  Active   Description of Group:  Patients in this group were introduced to the concept that additional supports including self-support are an essential part of recovery.  A song entitled "I Need Help!" was played and a group discussion was held in reaction to the idea of needing to add supports.  A song entitled "My Own Hero" was played and a group discussion ensued in which patients stated they could relate to the song and it inspired them to realize they have be willing to help themselves in order to succeed, because other people cannot achieve sobriety or stability for them.  We discussed adding a variety of healthy supports to address the various needs in their lives.  A song was played called "I Know Where I've Been" toward the end of group and used to conduct an inspirational wrap-up to group of remembering how far they have already come in their journey.  Therapeutic Goals: 1)  demonstrate the importance of being a part of one's own support system 2)  discuss reasons people in one's life may eventually be unable to be continually supportive  3)  identify the patient's current support system and   4)  elicit commitments to add healthy supports and to become more conscious of being self-supportive   Summary of Patient Progress:  The patient expressed that some family members and her boyfriend who is consistently there for her and understands mental health issues are her healthy supports, along with her therapist who is helpful.  Unhealthy supports include other family members and she was open to establishing healthier boundaries with them.  She also believes that it would benefit her to have someone to help her combat her eating disorder.   Therapeutic Modalities:   Motivational Interviewing Activity  Lynnell Chad

## 2019-09-12 NOTE — Progress Notes (Signed)
[facility] Group Notes:  (Nursing/MHT/Case Management/Adjunct)  Date:  09/12/2019  Time:  1300  Type of Therapy:  Nurse Education discussed goals for hospitalization   Participation Level:  Did Not Attend  Layla Barter 09/12/2019, 2:24 PM

## 2019-09-12 NOTE — Progress Notes (Signed)
   09/11/19 2100  Psych Admission Type (Psych Patients Only)  Admission Status Voluntary  Psychosocial Assessment  Patient Complaints Depression  Eye Contact Fair  Facial Expression Sad  Affect Depressed  Speech Logical/coherent  Interaction Assertive  Motor Activity Slow  Appearance/Hygiene Unremarkable  Behavior Characteristics Cooperative  Mood Depressed  Aggressive Behavior  Effect No apparent injury  Thought Process  Coherency WDL  Content WDL  Delusions WDL  Perception WDL  Hallucination None reported or observed  Judgment WDL  Danger to Self  Current suicidal ideation? Denies  Agreement Not to Harm Self Yes  Description of Agreement verbal contract  Danger to Others  Danger to Others None reported or observed   Pt endorses passive SI this morning but denies it now. Denies HI, AVH and pain. Pt visited by boyfriend today and that assisted with elevating her mood. Pt says her eczema getting worse. Her Aveeno lotion retrieved from locker and placed in med room for use if she needs it. Pt contracts for safety.

## 2019-09-12 NOTE — Progress Notes (Signed)
[place] NOVEL CORONAVIRUS (COVID-19) DAILY CHECK-OFF SYMPTOMS - answer yes or no to each - every day NO YES  Have you had a fever in the past 24 hours?  . Fever (Temp > 37.80C / 100F) X   Have you had any of these symptoms in the past 24 hours? . New Cough .  Sore Throat  .  Shortness of Breath .  Difficulty Breathing .  Unexplained Body Aches   X   Have you had any one of these symptoms in the past 24 hours not related to allergies?   . Runny Nose .  Nasal Congestion .  Sneezing   X   If you have had runny nose, nasal congestion, sneezing in the past 24 hours, has it worsened?  X   EXPOSURES - check yes or no X   Have you traveled outside the state in the past 14 days?  X   Have you been in contact with someone with a confirmed diagnosis of COVID-19 or PUI in the past 14 days without wearing appropriate PPE?  X   Have you been living in the same home as a person with confirmed diagnosis of COVID-19 or a PUI (household contact)?    X   Have you been diagnosed with COVID-19?    X              What to do next: Answered NO to all: Answered YES to anything:   Proceed with unit schedule Follow the [facility] Inpatient Flowsheet.   

## 2019-09-12 NOTE — Progress Notes (Signed)
[facility] MD Progress Note  09/12/2019 5:50 PM Chelsea Hoover  MRN:  [id number] Subjective: Patient reports some improvement compared to yesterday.  Yesterday had described feeling vaguely jittery and anxious which she attributed to Wellbutrin dose titration to 300 mg daily.  Today  describes improvement/resolution of the symptoms. Although endorses improved mood reports persistent intermittent suicidal ideations which are described as passive.  Of note she endorses these suicidal ideations as chronic, dating back several years.  Currently denies any suicidal plan or intention and contracts for safety on unit. Objective: I discussed case with treatment team and met with patient 24-year-old single female, lives with parents.  History of mood disorder/depression.  Reported worsening depression over recent weeks.  States she suspects she had Covid infection several weeks ago (felt ill and several of her family members were positive, but she herself was not tested).  States that since then she has been feeling more depressed and that her psychiatric medications seem not to be working as they had.  Patient reports she had been drinking up to six beers or shots of liquor on most days of the week and has been experiencing alcohol-related blackouts.  She endorses contributing factors to include stressful home environment, relationship strain particularly with father, stressful job.  She describes partial improvement compared to yesterday.  She states she is feeling less depressed today. Also endorses decreased/improved anxiety and no longer feels jittery or activated following Wellbutrin XL dose adjustment (see above) She has been visible on unit, interacting with peers of about her age, no disruptive or agitated behaviors.     Principal Problem: Depression, Alcohol Use Disorder  Diagnosis: Active Problems:   Bipolar 1 disorder (HCC)  Total Time spent with patient: 15 minutes  Past Psychiatric History: See  admission H&P  Past Medical History:  Past Medical History:  Diagnosis Date  . Anxiety   . 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 Bipolar 1 disorder (HCC)   . Cannabis abuse 09/03/2015  . Depression   . Irritable bowel syndrome   . Lyme disease   . Mood disorder (HCC)   . PTSD (post-traumatic stress disorder) 09/01/2015   History reviewed. No pertinent surgical history. Family History:  Family History  Problem Relation Age of Onset  . Crohn's disease Father    Family Psychiatric  History: See admission H&P Social History:  Social History   Substance and Sexual Activity  Alcohol Use No     Social History   Substance and Sexual Activity  Drug Use Yes  . Types: Marijuana   Comment: Few times a week    Social History   Socioeconomic History  . Marital status: Single    Spouse name: Not on file  . Number of children: Not on file  . Years of education: Not on file  . Highest education level: Not on file  Occupational History  . Not on file  Tobacco Use  . Smoking status: Never Smoker  . Smokeless tobacco: Never Used  Substance and Sexual Activity  . Alcohol use: No  . Drug use: Yes    Types: Marijuana    Comment: Few times a week  . Sexual activity: Yes    Birth control/protection: Pill  Other Topics Concern  . Not on file  Social History Narrative  . Not on file   Social Determinants of Health   Financial Resource Strain:   . Difficulty of Paying Living Expenses: Not on file  Food Insecurity:   . Worried About Running   Out of Food in the Last Year: Not on file  . Ran Out of Food in the Last Year: Not on file  Transportation Needs:   . Lack of Transportation (Medical): Not on file  . Lack of Transportation (Non-Medical): Not on file  Physical Activity:   . Days of Exercise per Week: Not on file  . Minutes of Exercise per Session: Not on file  Stress:   . Feeling of Stress : Not on file  Social Connections:   . Frequency of Communication with  Friends and Family: Not on file  . Frequency of Social Gatherings with Friends and Family: Not on file  . Attends Religious Services: Not on file  . Active Member of Clubs or Organizations: Not on file  . Attends Archivist Meetings: Not on file  . Marital Status: Not on file   Additional Social History:    Pain Medications: None Prescriptions: Welbutrin, Lamictal, Trazadone, Amogadone Over the Counter: Stomach acid reducer (hx of IBS) History of alcohol / drug use?: Yes Name of Substance 1: ETOH (beer & liquor) 1 - Age of First Use: 24 years of age 22 - Amount (size/oz): 4 beers and some liquor 1 - Frequency: Daily 1 - Duration: Over the last few weeks 1 - Last Use / Amount: 09/04/19 Name of Substance 2: Marijuana 2 - Age of First Use: 24 years of age 2 - Amount (size/oz): One bowl 2 - Frequency: Nightly 2 - Duration: ongoing 2 - Last Use / Amount: 09/04/19  Sleep: Good  Appetite:  Good  Current Medications: Current Facility-Administered Medications  Medication Dose Route Frequency Provider Last Rate Last Admin  . acetaminophen (TYLENOL) tablet 650 mg  650 mg Oral Q6H PRN Lindon Romp A, NP   650 mg at 09/07/19 2036  . alum & mag hydroxide-simeth (MAALOX/MYLANTA) 200-200-20 MG/5ML suspension 30 mL  30 mL Oral Q4H PRN Lindon Romp A, NP      . ARIPiprazole (ABILIFY) tablet 10 mg  10 mg Oral Daily Sujata Maines, Myer Peer, MD   10 mg at 09/12/19 0815  . [employer] Treatment 1 packet  1 packet Topical PRN Lindon Romp A, NP      . buPROPion (WELLBUTRIN XL) 24 hr tablet 150 mg  150 mg Oral Daily Jaken Fregia, Myer Peer, MD   150 mg at 09/12/19 0815  . famotidine (PEPCID) tablet 20 mg  20 mg Oral BID Lindon Romp A, NP   20 mg at 09/12/19 1710  . lamoTRIgine (LAMICTAL) tablet 150 mg  150 mg Oral BID Lindon Romp A, NP   150 mg at 09/12/19 1710  . magnesium hydroxide (MILK OF MAGNESIA) suspension 30 mL  30 mL Oral Daily PRN Lindon Romp A, NP      . multivitamin with  minerals tablet 1 tablet  1 tablet Oral Daily Kjuan Seipp, Myer Peer, MD   1 tablet at 09/12/19 0815  . pantoprazole (PROTONIX) EC tablet 40 mg  40 mg Oral Daily Lindon Romp A, NP   40 mg at 09/12/19 0815  . thiamine tablet 100 mg  100 mg Oral Daily Roy Tokarz, Myer Peer, MD   100 mg at 09/12/19 0815  . traZODone (DESYREL) tablet 50 mg  50 mg Oral QHS Nedda Gains, Myer Peer, MD   50 mg at 09/11/19 2100    Lab Results:  No results found for this or any previous visit (from the past 48 hour(s)).  Blood Alcohol level:  Lab Results  Component Value Date  ETH <11 08/02/2014    Metabolic Disorder Labs: Lab Results  Component Value Date   HGBA1C 5.3 09/07/2019   MPG 105 09/07/2019   MPG 120 09/01/2015   No results found for: PROLACTIN Lab Results  Component Value Date   CHOL 166 09/07/2019   TRIG 72 09/07/2019   HDL 63 09/07/2019   CHOLHDL 2.6 09/07/2019   VLDL 14 09/07/2019   LDLCALC 89 09/07/2019   LDLCALC 62 09/01/2015    Physical Findings: AIMS: Facial and Oral Movements Muscles of Facial Expression: None, normal Lips and Perioral Area: None, normal Jaw: None, normal Tongue: None, normal,Extremity Movements Upper (arms, wrists, hands, fingers): None, normal Lower (legs, knees, ankles, toes): None, normal, Trunk Movements Neck, shoulders, hips: None, normal, Overall Severity Severity of abnormal movements (highest score from questions above): None, normal Incapacitation due to abnormal movements: None, normal Patient's awareness of abnormal movements (rate only patient's report): No Awareness, Dental Status Current problems with teeth and/or dentures?: No Does patient usually wear dentures?: No  CIWA:  CIWA-Ar Total: 2 COWS:  COWS Total Score: 1  Musculoskeletal: Strength & Muscle Tone: within normal limits Gait & Station: normal Patient leans: N/A  Psychiatric Specialty Exam: Physical Exam  Nursing note and vitals reviewed. Constitutional: She is oriented to person, place,  and time. She appears well-developed and well-nourished.  Cardiovascular: Normal rate.  Respiratory: Effort normal.  Neurological: She is alert and oriented to person, place, and time.    Review of Systems  Constitutional: Negative.   Respiratory: Negative for cough and shortness of breath.   Gastrointestinal: Negative for diarrhea, nausea and vomiting.  Neurological: Positive for headaches. Negative for dizziness, tremors and seizures.  Psychiatric/Behavioral: Positive for dysphoric mood. Negative for agitation, behavioral problems, hallucinations, self-injury, sleep disturbance and suicidal ideas. The patient is nervous/anxious. The patient is not hyperactive.   denies chest pain , denies shortness of breath, no cough. No rash  Blood pressure 127/79, pulse 70, temperature 97.8 F (36.6 C), temperature source Oral, resp. rate 18, height 5' 1" (1.549 m), weight 88.9 kg, last menstrual period 09/07/2019, SpO2 100 %.Body mass index is 37.03 kg/m.  General Appearance: Casual  Eye Contact:  Good   Speech:  Normal Rate  Volume:  Normal  Mood:  Partially improved mood, reports feeling better today  Affect:  Presents less constricted, smiles at times appropriately during session  Thought Process:  Linear and Descriptions of Associations: Intact  Orientation:  Other:  Fully alert and attentive  Thought Content:  No hallucinations, no delusions, ruminative  Suicidal Thoughts:  Yes.  without intent/plan denies self injurious or suicidal ideations at this time and contracts for safety on unit  Homicidal Thoughts:  No  Memory:  Recent and remote grossly intact  Judgement:  Other:  Improving  Insight:  Fair/improving  Psychomotor Activity:  Normal-no tremors, no diaphoresis, no psychomotor restlessness  Concentration:  Concentration: Good and Attention Span: Good  Recall:  Good  Fund of Knowledge:  Good  Language:  Good  Akathisia:  No  Handed:  Right  AIMS (if indicated):     Assets:   Communication Skills Desire for Improvement Housing Physical Health Resilience  ADL's:  Intact  Cognition:  WNL  Sleep:  Number of Hours: 5.75   Assessment:  24-year-old single female, lives with parents.  History of mood disorder/depression.  Reported worsening depression over recent weeks.  States she suspects she had Covid infection several weeks ago (felt ill and several of her family members were   positive, but she herself was not tested).  States that since then she has been feeling more depressed and that her psychiatric medications seem not to be working as they had.  Patient reports she had been drinking up to six beers or shots of liquor on most days of the week and has been experiencing alcohol-related blackouts.  She endorses contributing factors to include stressful home environment, relationship strain particularly with father, stressful job.  Today reports feeling better than yesterday.  She had described subjective feelings of being jittery and more anxious after Wellbutrin XL was titrated from 150 mg to 300 mg daily.  States that the symptoms have now improved and at this time presents calm no acute distress, without tremors or restlessness.  Her mood is partially improved and her affect appears more reactive.  She does continue to endorse intermittent passive suicidal ideations which she is describing as chronic, and intermittently present for several years.  Thus far tolerating Abilify titration well.  Treatment Plan Summary: Daily contact with patient to assess and evaluate symptoms and progress in treatment and Medication management  Treatment plan reviewed as below today 1/10 Encourage group and milieu participation Encourage efforts to work on sobriety and relapse prevention Continue Wellbutrin XL 150 mgrs QDAY for depression Continue Abilify  10 mgrs QDAY for mood disorder  Continue Lamictal 150 mg PO BID for mood disorder Continue Pepcid 20 mg PO BID for GERD Continue  Protonix 40 mg PO daily for GERD Continue thiamine 100 mg PO daily for supplementation Continue Vistaril 25 mg PO Q6HR PRN anxiety Continue Trazodone 50 mg PO QHS PRN insomnia     A , MD 09/12/2019, 5:50 PM   Patient ID: Chelsea Hoover, female   DOB: 10/07/1995, 23 y.o.   MRN: [id number]  

## 2019-09-12 NOTE — Progress Notes (Signed)
D. Pt visible in the milieu interacting appropriately with peers and staff- per pt's self inventory, pt rated her depression, hopelessness and anxiety a 7/6/6, respectively. Pt writes that her goal today is "continueing to work on getting the right meds balance". Pt continues to endorse passive SI 'sometimes' with no plan- verbally contracts for safety. A. Labs and vitals monitored. Pt compliant with  medications. Pt supported emotionally and encouraged to express concerns and ask questions.  R. Pt remains safe with 15 minute checks. Will continue POC.

## 2019-09-12 NOTE — Progress Notes (Signed)
Pt did attend the evening wrap up group. Pt was attentive and sharing. Positive thoughts and positive change were discussed.  

## 2019-09-13 MED ORDER — ARIPIPRAZOLE 10 MG PO TABS: 10.0000 mg | ORAL_TABLET | Freq: Every day | ORAL | 0 refills | Status: DC

## 2019-09-13 MED ORDER — BUPROPION HCL ER (XL) 150 MG PO TB24: 150.0000 mg | ORAL_TABLET | Freq: Every day | ORAL | 0 refills | Status: DC

## 2019-09-13 MED ORDER — FAMOTIDINE 20 MG PO TABS: 20.0000 mg | ORAL_TABLET | Freq: Two times a day (BID) | ORAL | 0 refills | Status: DC

## 2019-09-13 MED ORDER — PANTOPRAZOLE SODIUM 40 MG PO TBEC: 40.0000 mg | DELAYED_RELEASE_TABLET | Freq: Every day | ORAL | 0 refills | Status: DC

## 2019-09-13 MED ORDER — TRAZODONE HCL 50 MG PO TABS: 50.0000 mg | ORAL_TABLET | Freq: Every evening | ORAL | 0 refills | Status: DC | PRN

## 2019-09-13 MED ORDER — LAMOTRIGINE 150 MG PO TABS: 150.0000 mg | ORAL_TABLET | Freq: Two times a day (BID) | ORAL | 0 refills | Status: DC

## 2019-09-13 MED ORDER — ADULT MULTIVITAMIN W/MINERALS CH: 1.0000 | ORAL_TABLET | Freq: Every day | ORAL | Status: DC

## 2019-09-13 NOTE — Progress Notes (Signed)
Pt discharged to lobby. Pt was stable and appreciative at that time. All papers and prescriptions were given and valuables returned. Verbal understanding expressed. Denies SI/HI and A/VH. Pt given opportunity to express concerns and ask questions.  

## 2019-09-13 NOTE — Tx Team (Signed)
Interdisciplinary Treatment and Diagnostic Plan Update  09/13/2019 Time of Session: 9:00am Chelsea Hoover MRN: [id number]  Principal Diagnosis: <principal problem not specified>  Secondary Diagnoses: Active Problems:   Bipolar 1 disorder (HCC)   Current Medications:  Current Facility-Administered Medications  Medication Dose Route Frequency Provider Last Rate Last Admin  . acetaminophen (TYLENOL) tablet 650 mg  650 mg Oral Q6H PRN Lindon Romp A, NP   650 mg at 09/07/19 2036  . alum & mag hydroxide-simeth (MAALOX/MYLANTA) 200-200-20 MG/5ML suspension 30 mL  30 mL Oral Q4H PRN Lindon Romp A, NP      . ARIPiprazole (ABILIFY) tablet 10 mg  10 mg Oral Daily Cobos, Myer Peer, MD   10 mg at 09/13/19 0810  . [employer] Treatment 1 packet  1 packet Topical PRN Lindon Romp A, NP      . buPROPion (WELLBUTRIN XL) 24 hr tablet 150 mg  150 mg Oral Daily Cobos, Myer Peer, MD   150 mg at 09/13/19 0810  . famotidine (PEPCID) tablet 20 mg  20 mg Oral BID Lindon Romp A, NP   20 mg at 09/13/19 0810  . lamoTRIgine (LAMICTAL) tablet 150 mg  150 mg Oral BID Lindon Romp A, NP   150 mg at 09/13/19 0810  . magnesium hydroxide (MILK OF MAGNESIA) suspension 30 mL  30 mL Oral Daily PRN Lindon Romp A, NP      . multivitamin with minerals tablet 1 tablet  1 tablet Oral Daily Cobos, Myer Peer, MD   1 tablet at 09/13/19 0810  . pantoprazole (PROTONIX) EC tablet 40 mg  40 mg Oral Daily Lindon Romp A, NP   40 mg at 09/13/19 0810  . thiamine tablet 100 mg  100 mg Oral Daily Cobos, Myer Peer, MD   100 mg at 09/13/19 0810  . traZODone (DESYREL) tablet 50 mg  50 mg Oral QHS PRN Cobos, Myer Peer, MD   50 mg at 09/12/19 2113   PTA Medications: Medications Prior to Admission  Medication Sig Dispense Refill Last Dose  . acetaminophen (TYLENOL) 500 MG tablet Take 1,000 mg by mouth every 6 (six) hours as needed for moderate pain or headache.    09/05/2019  . buPROPion (WELLBUTRIN XL) 150 MG 24 hr tablet Take  150 mg by mouth daily.   09/05/2019  . Colloidal Oatmeal (AVEENO ECZEMA THERAPY) 1 % CREA Apply 1 application topically as needed (For eczema.).   09/06/2019  . famotidine (PEPCID) 20 MG tablet Take 20 mg by mouth 2 (two) times daily.    09/06/2019  . hydrOXYzine (ATARAX/VISTARIL) 25 MG tablet Take 25 mg by mouth every 6 (six) hours as needed (For panic attacks.).    Past Month  . lamoTRIgine (LAMICTAL) 150 MG tablet Take 150 mg by mouth 2 (two) times daily.   09/05/2019  . traZODone (DESYREL) 100 MG tablet Take 100 mg by mouth at bedtime.   09/05/2019    Patient Stressors:    Patient Strengths: Average or above average intelligence Capable of independent living Communication skills  Treatment Modalities: Medication Management, Group therapy, Case management,  1 to 1 session with clinician, Psychoeducation, Recreational therapy.   Physician Treatment Plan for Primary Diagnosis: <principal problem not specified> Long Term Goal(s): Improvement in symptoms so as ready for discharge Improvement in symptoms so as ready for discharge   Short Term Goals: Ability to identify changes in lifestyle to reduce recurrence of condition will improve Ability to verbalize feelings will improve Ability to disclose and  discuss suicidal ideas Ability to demonstrate self-control will improve Ability to identify and develop effective coping behaviors will improve  Medication Management: Evaluate patient's response, side effects, and tolerance of medication regimen.  Therapeutic Interventions: 1 to 1 sessions, Unit Group sessions and Medication administration.  Evaluation of Outcomes: Adequate for Discharge  Physician Treatment Plan for Secondary Diagnosis: Active Problems:   Bipolar 1 disorder (HCC)  Long Term Goal(s): Improvement in symptoms so as ready for discharge Improvement in symptoms so as ready for discharge   Short Term Goals: Ability to identify changes in lifestyle to reduce recurrence of  condition will improve Ability to verbalize feelings will improve Ability to disclose and discuss suicidal ideas Ability to demonstrate self-control will improve Ability to identify and develop effective coping behaviors will improve     Medication Management: Evaluate patient's response, side effects, and tolerance of medication regimen.  Therapeutic Interventions: 1 to 1 sessions, Unit Group sessions and Medication administration.  Evaluation of Outcomes: Adequate for Discharge   RN Treatment Plan for Primary Diagnosis: <principal problem not specified> Long Term Goal(s): Knowledge of disease and therapeutic regimen to maintain health will improve  Short Term Goals: Ability to demonstrate self-control, Ability to verbalize feelings will improve and Ability to identify and develop effective coping behaviors will improve  Medication Management: RN will administer medications as ordered by provider, will assess and evaluate patient's response and provide education to patient for prescribed medication. RN will report any adverse and/or side effects to prescribing provider.  Therapeutic Interventions: 1 on 1 counseling sessions, Psychoeducation, Medication administration, Evaluate responses to treatment, Monitor vital signs and CBGs as ordered, Perform/monitor CIWA, COWS, AIMS and Fall Risk screenings as ordered, Perform wound care treatments as ordered.  Evaluation of Outcomes: Adequate for Discharge   LCSW Treatment Plan for Primary Diagnosis: <principal problem not specified> Long Term Goal(s): Safe transition to appropriate next level of care at discharge, Engage patient in therapeutic group addressing interpersonal concerns.  Short Term Goals: Engage patient in aftercare planning with referrals and resources, Increase social support, Increase emotional regulation, Identify triggers associated with mental health/substance abuse issues and Increase skills for wellness and  recovery  Therapeutic Interventions: Assess for all discharge needs, 1 to 1 time with Social worker, Explore available resources and support systems, Assess for adequacy in community support network, Educate family and significant other(s) on suicide prevention, Complete Psychosocial Assessment, Interpersonal group therapy.  Evaluation of Outcomes: Adequate for Discharge  Progress in Treatment: Attending groups: Yes. Participating in groups: Yes. Taking medication as prescribed: Yes. Toleration medication: Yes. Family/Significant other contact made: Yes, individual(s) contacted:  the patient's mother Patient understands diagnosis: Yes. Discussing patient identified problems/goals with staff: Yes. Medical problems stabilized or resolved: Yes. Denies suicidal/homicidal ideation: No. Issues/concerns per patient self-inventory: No.  New problem(s) identified: No, Describe:  none.  New Short Term/Long Term Goal(s): medication management for mood stabilization; elimination of SI thoughts; development of comprehensive mental wellness/sobriety plan.  Patient Goals: "Get my medications straightened out."  Discharge Plan or Barriers: Patient expected to return home and will continue with Dr.Akintayo for medication management and her current therapist.  Reason for Continuation of Hospitalization: None  Estimated Length of Stay: 1-3 days  Attendees: Patient: Chelsea Hoover 09/13/2019 10:37 AM  Physician: Dr. Nehemiah Massed, MD 09/13/2019 10:37 AM  Nursing: Meriam Sprague RN; Lanora Manis.Val Eagle RN 09/13/2019 10:37 AM  RN Care Manager: 09/13/2019 10:37 AM  Social Worker: Enid Cutter, LCSWA; [place], [place] 09/13/2019 10:37 AM  Recreational Therapist:  09/13/2019 10:37  AM  Other:  09/13/2019 10:37 AM  Other:  09/13/2019 10:37 AM  Other: 09/13/2019 10:37 AM    Scribe for Treatment Team: Marylee Floras, [place] 09/13/2019 10:37 AM

## 2019-09-13 NOTE — BHH Suicide Risk Assessment (Signed)
[facility] Discharge Suicide Risk Assessment   Principal Problem:  Depression Discharge Diagnoses: Active Problems:   Bipolar 1 disorder (HCC)   Total Time spent with patient: 30 minutes  Musculoskeletal: Strength & Muscle Tone: within normal limits Gait & Station: normal Patient leans: N/A  Psychiatric Specialty Exam: Review of Systems denies headache, no chest pain, no shortness of breath, no vomiting , no rash   Blood pressure 123/85, pulse 88, temperature 98.1 F (36.7 C), temperature source Oral, resp. rate 18, height 5\' 1"  (1.549 m), weight 88.9 kg, last menstrual period 09/07/2019, SpO2 100 %.Body mass index is 37.03 kg/m.  General Appearance: improving grooming   Eye Contact::  Good  Speech:  Normal Rate409  Volume:  Normal  Mood:  reports she is feeling better and states " I am having a good day today"  Affect:  more reactive/less constricted   Thought Process:  Linear and Descriptions of Associations: Intact  Orientation:  Full (Time, Place, and Person)  Thought Content:  no hallucinations , no delusions expressed, does not appear internally preoccupied   Suicidal Thoughts:  No denies suicidal or self injurious ideations, denies homicidal or violent ideations  Homicidal Thoughts:  No  Memory:  recent and remote grossly intact   Judgement:  Other:  improving   Insight:  improving   Psychomotor Activity:  Normal  Concentration:  Good  Recall:  Good  Fund of Knowledge:Good  Language: Good  Akathisia:  Negative  Handed:  Right  AIMS (if indicated):     Assets:  Communication Skills Desire for Improvement Resilience  Sleep:  Number of Hours: 6.25  Cognition: WNL  ADL's:  Intact   Mental Status Per Nursing Assessment::   On Admission:  Suicidal ideation indicated by patient, Suicide plan  Demographic Factors:  23, single, no children, lives with parents, employed   Loss Factors: Reports strained relationship with parents, particularly father, related to her family  being [employer] and her decision to leave the church  Historical Factors: History of prior psychiatric admissions, most recently 2016 for depression. History of suicide attempt by overdose 2016. Has been diagnosed with Bipolar Disorder in the past .   Risk Reduction Factors:   Sense of responsibility to family, Living with another person, especially a relative and Positive coping skills or problem solving skills  Continued Clinical Symptoms:  Today patient presents alert, attentive, calm, reports she is feeling better and presents with improved mood and improved range of affect , smiles at times appropriately during session. No thought disorder, no suicidal or self injurious ideations, no homicidal or violent ideations, currently future oriented, stating she is hopeful of getting a job and eventually moving out of paternal home. No psychotic symptoms.  No residual symptoms of alcohol WDL noted or reported and has no significant tremors, no restlessness, vitals are stable. Denies medication side effects which we reviewed. States that vague symptoms of jitteriness resolved when Wellbutrin XL was decreased to current 150 mgr daily dose . She is aware of potentially increased risk of suicidal ideations early in treatment with antidepressants in young adults. We also reviewed risk of severe rash on lamotrigine and potential for metabolic, motor ( including akathisia and TD) side effects related to Abilify. Patient reports GERD symptoms much improved on Protonix and is hoping to continue this medication for now. With her express consent I spoke with her mother via phone- mother corroborates improvement and is in agreement with discharge today. Mother reports concern about patient's alcohol use pattern leading  to admission, as she states there is a history of alcohol abuse in family.  Cognitive Features That Contribute To Risk:  No gross cognitive deficits noted upon discharge. Is alert , attentive, and  oriented x 3   Suicide Risk:  Mild:  Suicidal ideation of limited frequency, intensity, duration, and specificity.  There are no identifiable plans, no associated intent, mild dysphoria and related symptoms, good self-control (both objective and subjective assessment), few other risk factors, and identifiable protective factors, including available and accessible social support.  Follow-up Information    [facility] Follow up on 09/22/2019.   Why: Your medication management appointment is scheduled for 01/20 at 4:00pm. The provider will call you. Please have access to your medications and hospital discharge paperwork. Contact information: [address] [place] [address] [phone]        [facility] [employer]. Go on 09/15/2019.   Why: Your next therapy appointment is Wednesday, 09/15/2019 at 1:00pm. Please be sure to bring any discharge paperwork from this hospitalization.  Contact information: [address], [place], [place] [address]  [phone] [phone]       [facility] HOSPITALIZATION PROGRAM Follow up on 09/14/2019.   Specialty: Behavioral Health Why: Your intake appointment for the [facility]) is scheduled for 01/12 at 12pm via Webex with Whitney. Your provider has emailed you a link. Please call if you have questions.  Contact information: [address] [id number] mc [place] [address] [phone]          Plan Of Care/Follow-up recommendations:  Activity:  as tolerated  Diet:  regular Tests:  NA Other:  See below  Patient is expressing readiness for discharge and is leaving unit in good spirits. No current grounds for involuntary commitment. Plans to return home. Plans to follow up as above. Abstinence from alcohol recommended .   Craige Cotta, MD 09/13/2019, 11:38 AM

## 2019-09-13 NOTE — Progress Notes (Signed)
  [facility] Adult Case Management Discharge Plan :  Will you be returning to the same living situation after discharge:  Yes,  parents house At discharge, do you have transportation home?: Yes,  car is outside at [facility] Do you have the ability to pay for your medications: Yes,  private insurance  Release of information consent forms completed and in the chart;  Patient's signature needed at discharge.  Patient to Follow up at: Follow-up Information    [facility] Follow up on 09/22/2019.   Why: Your medication management appointment is scheduled for 01/20 at 4:00pm. The provider will call you. Please have access to your medications and hospital discharge paperwork. Contact information: [address] [place] [address] [phone]        [facility] [employer]. Go on 09/15/2019.   Why: Your next therapy appointment is Wednesday, 09/15/2019 at 1:00pm. Please be sure to bring any discharge paperwork from this hospitalization.  Contact information: [address], [place], [place] [address]  Phone:[phone] [phone]       BEHAVIORAL HEALTH PARTIAL HOSPITALIZATION PROGRAM Follow up on 09/14/2019.   Specialty: Behavioral Health Why: Your intake appointment for the [facility]) is scheduled for 01/12 at 12pm via Webex with Whitney. Your provider has emailed you a link. Please call if you have questions.  Contact information: [phone] mc [place] [address] [phone]          Next level of care provider has access to [facility] Link:no  Safety Planning and Suicide Prevention discussed: Yes,  pt's mother     Has patient been referred to the Quitline?: N/A patient is not a smoker  Patient has been referred for addiction treatment: Yes  Delphia Grates, LCSW 09/13/2019, 10:09 AM

## 2019-09-13 NOTE — Progress Notes (Signed)
Recreation Therapy Notes  Date:  1.11.21 Time: 0930 Location: 300 Hall Dayroom  Group Topic: Stress Management  Goal Area(s) Addresses:  Patient will identify positive stress management techniques. Patient will identify benefits of using stress management post d/c.  Behavioral Response: Engaged  Intervention: Stress Management  Activity : Meditation.  LRT played a meditation that focused on making the most of your day and seeing each day as a new opportunity to accomplish something.  Patients were to listen and follow along as meditation played to engage.  Education:  Stress Management, Discharge Planning.   Education Outcome: Acknowledges Education  Clinical Observations/Feedback: Pt attended and participated in activity.    Caroll Rancher, LRT/CTRS         Caroll Rancher A 09/13/2019 11:53 AM

## 2019-09-13 NOTE — Tx Team (Signed)
Interdisciplinary Treatment and Diagnostic Plan Update  09/13/2019 Time of Session: 9:00am Chelsea Hoover MRN: [id number]  Principal Diagnosis: <principal problem not specified>  Secondary Diagnoses: Active Problems:   Bipolar 1 disorder (HCC)   Current Medications:  Current Facility-Administered Medications  Medication Dose Route Frequency Provider Last Rate Last Admin  . acetaminophen (TYLENOL) tablet 650 mg  650 mg Oral Q6H PRN Lindon Romp A, NP   650 mg at 09/07/19 2036  . alum & mag hydroxide-simeth (MAALOX/MYLANTA) 200-200-20 MG/5ML suspension 30 mL  30 mL Oral Q4H PRN Lindon Romp A, NP      . ARIPiprazole (ABILIFY) tablet 10 mg  10 mg Oral Daily Cobos, Myer Peer, MD   10 mg at 09/13/19 0810  . [employer] Treatment 1 packet  1 packet Topical PRN Lindon Romp A, NP      . buPROPion (WELLBUTRIN XL) 24 hr tablet 150 mg  150 mg Oral Daily Cobos, Myer Peer, MD   150 mg at 09/13/19 0810  . famotidine (PEPCID) tablet 20 mg  20 mg Oral BID Lindon Romp A, NP   20 mg at 09/13/19 0810  . lamoTRIgine (LAMICTAL) tablet 150 mg  150 mg Oral BID Lindon Romp A, NP   150 mg at 09/13/19 0810  . magnesium hydroxide (MILK OF MAGNESIA) suspension 30 mL  30 mL Oral Daily PRN Lindon Romp A, NP      . multivitamin with minerals tablet 1 tablet  1 tablet Oral Daily Cobos, Myer Peer, MD   1 tablet at 09/13/19 0810  . pantoprazole (PROTONIX) EC tablet 40 mg  40 mg Oral Daily Lindon Romp A, NP   40 mg at 09/13/19 0810  . thiamine tablet 100 mg  100 mg Oral Daily Cobos, Myer Peer, MD   100 mg at 09/13/19 0810  . traZODone (DESYREL) tablet 50 mg  50 mg Oral QHS PRN Cobos, Myer Peer, MD   50 mg at 09/12/19 2113   PTA Medications: Medications Prior to Admission  Medication Sig Dispense Refill Last Dose  . acetaminophen (TYLENOL) 500 MG tablet Take 1,000 mg by mouth every 6 (six) hours as needed for moderate pain or headache.    09/05/2019  . buPROPion (WELLBUTRIN XL) 150 MG 24 hr tablet Take  150 mg by mouth daily.   09/05/2019  . Colloidal Oatmeal (AVEENO ECZEMA THERAPY) 1 % CREA Apply 1 application topically as needed (For eczema.).   09/06/2019  . famotidine (PEPCID) 20 MG tablet Take 20 mg by mouth 2 (two) times daily.    09/06/2019  . hydrOXYzine (ATARAX/VISTARIL) 25 MG tablet Take 25 mg by mouth every 6 (six) hours as needed (For panic attacks.).    Past Month  . lamoTRIgine (LAMICTAL) 150 MG tablet Take 150 mg by mouth 2 (two) times daily.   09/05/2019  . traZODone (DESYREL) 100 MG tablet Take 100 mg by mouth at bedtime.   09/05/2019    Patient Stressors:    Patient Strengths: Average or above average intelligence Capable of independent living Communication skills  Treatment Modalities: Medication Management, Group therapy, Case management,  1 to 1 session with clinician, Psychoeducation, Recreational therapy.   Physician Treatment Plan for Primary Diagnosis: <principal problem not specified> Long Term Goal(s): Improvement in symptoms so as ready for discharge Improvement in symptoms so as ready for discharge   Short Term Goals: Ability to identify changes in lifestyle to reduce recurrence of condition will improve Ability to verbalize feelings will improve Ability to disclose and  discuss suicidal ideas Ability to demonstrate self-control will improve Ability to identify and develop effective coping behaviors will improve  Medication Management: Evaluate patient's response, side effects, and tolerance of medication regimen.  Therapeutic Interventions: 1 to 1 sessions, Unit Group sessions and Medication administration.  Evaluation of Outcomes: Adequate for Discharge  Physician Treatment Plan for Secondary Diagnosis: Active Problems:   Bipolar 1 disorder (HCC)  Long Term Goal(s): Improvement in symptoms so as ready for discharge Improvement in symptoms so as ready for discharge   Short Term Goals: Ability to identify changes in lifestyle to reduce recurrence of  condition will improve Ability to verbalize feelings will improve Ability to disclose and discuss suicidal ideas Ability to demonstrate self-control will improve Ability to identify and develop effective coping behaviors will improve     Medication Management: Evaluate patient's response, side effects, and tolerance of medication regimen.  Therapeutic Interventions: 1 to 1 sessions, Unit Group sessions and Medication administration.  Evaluation of Outcomes: Adequate for Discharge   RN Treatment Plan for Primary Diagnosis: <principal problem not specified> Long Term Goal(s): Knowledge of disease and therapeutic regimen to maintain health will improve  Short Term Goals: Ability to demonstrate self-control, Ability to verbalize feelings will improve and Ability to identify and develop effective coping behaviors will improve  Medication Management: RN will administer medications as ordered by provider, will assess and evaluate patient's response and provide education to patient for prescribed medication. RN will report any adverse and/or side effects to prescribing provider.  Therapeutic Interventions: 1 on 1 counseling sessions, Psychoeducation, Medication administration, Evaluate responses to treatment, Monitor vital signs and CBGs as ordered, Perform/monitor CIWA, COWS, AIMS and Fall Risk screenings as ordered, Perform wound care treatments as ordered.  Evaluation of Outcomes: Adequate for Discharge   LCSW Treatment Plan for Primary Diagnosis: <principal problem not specified> Long Term Goal(s): Safe transition to appropriate next level of care at discharge, Engage patient in therapeutic group addressing interpersonal concerns.  Short Term Goals: Engage patient in aftercare planning with referrals and resources, Increase social support, Increase emotional regulation, Identify triggers associated with mental health/substance abuse issues and Increase skills for wellness and  recovery  Therapeutic Interventions: Assess for all discharge needs, 1 to 1 time with Social worker, Explore available resources and support systems, Assess for adequacy in community support network, Educate family and significant other(s) on suicide prevention, Complete Psychosocial Assessment, Interpersonal group therapy.  Evaluation of Outcomes: Adequate for Discharge  Progress in Treatment: Attending groups: Yes. Participating in groups: Yes. Taking medication as prescribed: Yes. Toleration medication: Yes. Family/Significant other contact made: Yes, individual(s) contacted:  the patient's mother Patient understands diagnosis: Yes. Discussing patient identified problems/goals with staff: Yes. Medical problems stabilized or resolved: Yes. Denies suicidal/homicidal ideation: No. Issues/concerns per patient self-inventory: No.  New problem(s) identified: No, Describe:  none.  New Short Term/Long Term Goal(s): medication management for mood stabilization; elimination of SI thoughts; development of comprehensive mental wellness/sobriety plan.  Patient Goals: "Get my medications straightened out."  Discharge Plan or Barriers: Patient expected to return home and will continue with Dr.Akintayo for medication management and her current therapist.  Reason for Continuation of Hospitalization: None  Estimated Length of Stay: 1-3 days  Attendees: Patient: Chelsea Hoover 09/13/2019 10:34 AM  Physician: Dr. Nehemiah Massed, MD 09/13/2019 10:34 AM  Nursing: Meriam Sprague, RN  09/13/2019 10:34 AM  RN Care Manager: 09/13/2019 10:34 AM  Social Worker: Enid Cutter, LCSWA; [place], LCSW 09/13/2019 10:34 AM  Recreational Therapist:  09/13/2019 10:34 AM  Other:  09/13/2019 10:34 AM  Other:  09/13/2019 10:34 AM  Other: 09/13/2019 10:34 AM    Scribe for Treatment Team: Maeola Sarah, LCSWA 09/13/2019 10:34 AM
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
## 2019-09-14 ENCOUNTER — Other Ambulatory Visit (HOSPITAL_COMMUNITY): Payer: BC Managed Care – PPO | Attending: Psychiatry | Admitting: Licensed Clinical Social Worker

## 2019-09-14 ENCOUNTER — Other Ambulatory Visit: Payer: Self-pay

## 2019-09-14 DIAGNOSIS — F419 Anxiety disorder, unspecified: Secondary | ICD-10-CM | POA: Diagnosis not present

## 2019-09-14 DIAGNOSIS — Z79899 Other long term (current) drug therapy: Secondary | ICD-10-CM | POA: Insufficient documentation

## 2019-09-14 DIAGNOSIS — R45851 Suicidal ideations: Secondary | ICD-10-CM | POA: Diagnosis not present

## 2019-09-14 DIAGNOSIS — Z885 Allergy status to narcotic agent status: Secondary | ICD-10-CM | POA: Insufficient documentation

## 2019-09-14 DIAGNOSIS — F1211 Cannabis abuse, in remission: Secondary | ICD-10-CM | POA: Diagnosis not present

## 2019-09-14 DIAGNOSIS — K589 Irritable bowel syndrome without diarrhea: Secondary | ICD-10-CM | POA: Diagnosis not present

## 2019-09-14 DIAGNOSIS — Z91040 Latex allergy status: Secondary | ICD-10-CM | POA: Diagnosis not present

## 2019-09-14 DIAGNOSIS — F319 Bipolar disorder, unspecified: Secondary | ICD-10-CM | POA: Diagnosis not present

## 2019-09-14 DIAGNOSIS — Z818 Family history of other mental and behavioral disorders: Secondary | ICD-10-CM | POA: Diagnosis not present

## 2019-09-14 DIAGNOSIS — E739 Lactose intolerance, unspecified: Secondary | ICD-10-CM | POA: Diagnosis not present

## 2019-09-15 ENCOUNTER — Other Ambulatory Visit: Payer: Self-pay

## 2019-09-15 ENCOUNTER — Other Ambulatory Visit (HOSPITAL_COMMUNITY): Payer: BC Managed Care – PPO

## 2019-09-15 ENCOUNTER — Telehealth (HOSPITAL_COMMUNITY): Payer: Self-pay | Admitting: Professional
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
## 2019-09-16 ENCOUNTER — Other Ambulatory Visit (HOSPITAL_COMMUNITY): Payer: BC Managed Care – PPO | Admitting: Occupational Therapy

## 2019-09-16 ENCOUNTER — Other Ambulatory Visit: Payer: Self-pay

## 2019-09-16 ENCOUNTER — Other Ambulatory Visit (HOSPITAL_COMMUNITY): Payer: BC Managed Care – PPO | Admitting: Licensed Clinical Social Worker

## 2019-09-16 ENCOUNTER — Encounter (HOSPITAL_COMMUNITY): Payer: Self-pay | Admitting: Occupational Therapy

## 2019-09-16 DIAGNOSIS — R4589 Other symptoms and signs involving emotional state: Secondary | ICD-10-CM

## 2019-09-16 DIAGNOSIS — F319 Bipolar disorder, unspecified: Secondary | ICD-10-CM

## 2019-09-16 NOTE — Therapy (Signed)
[facility] PARTIAL HOSPITALIZATION PROGRAM [address] [place], [place], [address] Phone: [phone]   Fax:  [phone]  Occupational Therapy Evaluation  Patient Details  Name: Chelsea Hoover MRN: [id number] Date of Birth: July 15, 1996 Referring Provider (OT): Hillery Jacks, NP  Virtual Visit via Video Note  I connected with [name] on 09/16/19 at  8:00 AM EST by a video enabled telemedicine application and verified that I am speaking with the correct person using two identifiers.   I discussed the limitations of evaluation and management by telemedicine and the availability of in person appointments. The patient expressed understanding and agreed to proceed.   I discussed the assessment and treatment plan with the patient. The patient was provided an opportunity to ask questions and all were answered. The patient agreed with the plan and demonstrated an understanding of the instructions.   The patient was advised to call back or seek an in-person evaluation if the symptoms worsen or if the condition fails to improve as anticipated.  I provided 90 minutes of non-face-to-face time during this encounter.   Dalphine Handing, OT    Encounter Date: 09/16/2019  OT End of Session - 09/16/19 1656    Visit Number  1    Number of Visits  12    Date for OT Re-Evaluation  10/14/19    Authorization Type  [employer]    OT Start Time  1030    OT Stop Time  1200    OT Time Calculation (min)  90 min    Activity Tolerance  Patient tolerated treatment well    Behavior During Therapy  WFL for tasks assessed/performed       Past Medical History:  Diagnosis Date  . Anxiety   . 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 Bipolar 1 disorder (HCC)   . Cannabis abuse 09/03/2015  . Depression   . Irritable bowel syndrome   . Lyme disease   . Mood disorder (HCC)   . PTSD (post-traumatic stress disorder) 09/01/2015    History reviewed. No pertinent surgical  history.  There were no vitals filed for this visit.  Subjective Assessment - 09/16/19 1654    Currently in Pain?  No/denies        [facility] OT Assessment - 09/16/19 0001      Assessment   Medical Diagnosis  Bipolar 1 disorder, current episode depressed    Referring Provider (OT)  Hillery Jacks, NP    Onset Date/Surgical Date  09/16/19      Precautions   Precautions  None      Restrictions   Weight Bearing Restrictions  No      Balance Screen   Has the patient fallen in the past 6 months  No    Has the patient had a decrease in activity level because of a fear of falling?   No    Is the patient reluctant to leave their home because of a fear of falling?   No         OT assessment: OCAIRS  Diagnosis: Bipolar 1 disorder  Past medical history/referral information: Pt had recent [facility] stay, referred to [facility] for continued management  Living situation: Recently moved back in with parents  ADLs/IADLs: Pt reports decreased motivation  Work: Was working for [employer], but states she does not like her job and would like to look for a new one  Leisure: coloring, video games  Social support: Health and safety inspector Mental Health Interview Summary of  Client Scores:  FACILITATES PARTICIPATION IN OCCUPATION  ALLOWS PARTICIPATION IN OCCUPATION INHIBITS PARTICIPATION IN OCCUPATION RESTRICTS PARTICIPATION IN OCCUPATION COMMENTS  ROLES                X              HABITS                X    PERSONAL CAUSATION                X    VALUES               X     INTERESTS                X    SKILLS                X    SHORT TERM GOALS                 X   LONG TERM GOALS                 X   INTERPETATION OF PAST EXPERIENCES                X    PHYSICAL ENVIRONMENT               X                  SOCIAL ENVIRONMENT                 X   READINESS FOR CHANGE                X      Need for Occupational Therapy:  4 Shows positive occupational participation, no need for OT.   3 Need for minimal  intervention/consultative participation    X 2 Need for OT intervention indicated to restore/improve participation   1 Need for extensive OT intervention indicated to improve participation.  Referral for follow up services also recommended.    Assessment:  Patient demonstrates behavior that inhibits participation in occupation.  Patient will benefit from occupational therapy intervention in order to improve time management, financial management, stress management, job readiness skills, social skills, and health management skills in preparation to return to full time community living and to be a productive community member.    Plan:  Patient will participate in skilled occupational therapy sessions individually or in a group setting to improve coping skills, psychosocial skills, and emotional skills required to return to prior level of function.  Treatment will be 2 times per week for 3 weeks.            OT TREATMENT- Sleep hygiene training  S: I under sleep more often  O: Pt educated on sleep hygiene as it pertains to daily life/routines this date. Education given on appropriate sleep routines, sleep disorders, detriments of too much/too little sleep with encouraged feedback of personal Experiences. Sleep hygiene handout given for pt to choose new areas for implementation in BADL routine. Further education given on relaxation techniques to implement before bed. Pt asked to identify one STG in relation to sleep hygiene to create better daily sleep habits.  A: Pt presents with blunted affect, engaged and participatory throughout session. Pt shares that she under sleeps most often. She states her sleep is broken, and would like to use the skill "get up and try again" to improve sleep interruptions.  She also wants to commit to taking less naps.   P: OT group will be x2 per week while pt in [place]     OT Education - 09/16/19 1656    Education Details  education given on sleep hygiene     Person(s) Educated  Patient    Methods  Explanation;Handout    Comprehension  Verbalized understanding       OT Short Term Goals - 09/16/19 1659      OT SHORT TERM GOAL #1   Title  Pt will be educated on strategies to improve psychosocial skills needed to participate fully in all daily, work, and leisure activities    Time  4    Period  Weeks    Status  New    Target Date  10/14/19      OT SHORT TERM GOAL #2   Title  Pt will apply psychosocial skills and coping mechanisms to daily activities in order to function independently and reintegrate into community    Time  4    Period  Weeks    Status  New      OT SHORT TERM GOAL #3   Title  Pt will engage in goal setting to improve functional BADL/IADL routine upon reintegrating into community.    Time  4    Period  Weeks    Status  New      OT SHORT TERM GOAL #4   Title  Pt will identify 1-3 new job opportunities in current vocational search for improved community integration    Time  [place] - 09/16/19 1657    [place] and History  Detailed Assessment- Review of Records and additional review of physical, cognitive, psychosocial history related to current functional performance    Occupational performance deficits (Please refer to evaluation for details):  ADL's;IADL's;Rest and Sleep;Work;Leisure;Social Participation    Body Structure / Function / Physical Skills  ADL;IADL    Cognitive Skills  Emotional;Energy/Drive    Psychosocial Skills  Coping Strategies;Routines and Behaviors;Habits;Interpersonal Interaction    Rehab Potential  Good    Clinical Decision Making  Several treatment options, min-mod task modification necessary    Comorbidities Affecting Occupational Performance:  May have comorbidities impacting occupational performance    Modification or Assistance to Complete Evaluation   Min-Moderate modification of tasks or assist with assess necessary to  complete eval    OT Frequency  3x / week    OT Duration  4 weeks    OT Treatment/Interventions  Self-care/ADL training;Psychosocial skills training;Coping strategies training;Other (comment)   community reintegration      Patient will benefit from skilled therapeutic intervention in order to improve the following deficits and impairments:   Body Structure / Function / Physical Skills: ADL, IADL Cognitive Skills: Emotional, Energy/Drive Psychosocial Skills: Coping Strategies, Routines and Behaviors, Habits, Interpersonal Interaction   Visit Diagnosis: Bipolar 1 disorder, depressed ([place])  Difficulty coping    Problem List Patient Active Problem List   Diagnosis Date Noted  . Bipolar 1 disorder ([place]) 09/07/2019  . Cannabis abuse 09/03/2015  . Bipolar 1 disorder, depressed ([place]) 09/01/2015  . 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 PTSD (post-traumatic stress disorder) 09/01/2015   Zenovia Jarred, MSOT, OTR/L  Zenovia Jarred 09/16/2019, [address]  [place], [place], [address] Phone: [phone]   Fax:  [phone]  Name: Kaylany Tesoriero MRN: [id number] Date of Birth: 05/27/1996

## 2019-09-16 NOTE — Psych (Signed)
Virtual Visit via Video Note  I connected with Chelsea Hoover on 09/16/19 at  9:00 AM EST by a video enabled telemedicine application and verified that I am speaking with the correct person using two identifiers.   I discussed the limitations of evaluation and management by telemedicine and the availability of in person appointments. The patient expressed understanding and agreed to proceed.    I discussed the assessment and treatment plan with the patient. The patient was provided an opportunity to ask questions and all were answered. The patient agreed with the plan and demonstrated an understanding of the instructions.   The patient was advised to call back or seek an in-person evaluation if the symptoms worsen or if the condition fails to improve as anticipated.  I provided 10 minutes of non-face-to-face time during this encounter.   Chelsea Hoover, [facility]

## 2019-09-17 ENCOUNTER — Other Ambulatory Visit: Payer: Self-pay

## 2019-09-17 ENCOUNTER — Other Ambulatory Visit (HOSPITAL_COMMUNITY): Payer: BC Managed Care – PPO | Admitting: Licensed Clinical Social Worker

## 2019-09-17 ENCOUNTER — Encounter (HOSPITAL_COMMUNITY): Payer: Self-pay | Admitting: Family

## 2019-09-17 ENCOUNTER — Other Ambulatory Visit (HOSPITAL_COMMUNITY): Payer: BC Managed Care – PPO

## 2019-09-17 DIAGNOSIS — F3132 Bipolar disorder, current episode depressed, moderate: Secondary | ICD-10-CM

## 2019-09-17 NOTE — Progress Notes (Signed)
  Spoke with patient via Webex video call, used 2 identifiers to correctly identify patient. She was inpatient at [facility] for 6 days for suicidal thoughts. Also states her medications had stopped working and she was going from manic to depressive episodes. She has been diagnosed Bipolar. She continues to have passive suicidal thoughts but no plan or intent. Denies HI or AV hallucinations. This is her first time in [facility] and she is enjoying it so far. Feels like it will help her. Medications are working for her and no side effects other than feeling some nervous energy since starting Abilify but "nothing I can't handle." She will monitor to see if it improves or worsens and let us know. She is not sleeping enough, wakes a couple times a night. Pt is unsure why her Trazodone was decreased to 50mg  from the previous dose of 100mg  stating that the 100mg  was working good for her. She would like to discuss increasing it back to 100mg  qhs. On scale 1-10 as 10 being worst she rates depression at 6 and anxiety at 6. PHQ9=18. No issues or complaints.
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
## 2019-09-19 NOTE — Psych (Signed)
Virtual Visit via Video Note  I connected with Chelsea Hoover on 09/16/19 at  9:00 AM EST by a video enabled telemedicine application and verified that I am speaking with the correct person using two identifiers.   I discussed the limitations of evaluation and management by telemedicine and the availability of in person appointments. The patient expressed understanding and agreed to proceed.  I discussed the assessment and treatment plan with the patient. The patient was provided an opportunity to ask questions and all were answered. The patient agreed with the plan and demonstrated an understanding of the instructions.   The patient was advised to call back or seek an in-person evaluation if the symptoms worsen or if the condition fails to improve as anticipated.  Pt was provided 240 minutes of non-face-to-face time during this encounter.   [name], LCSW    [facility] PHP THERAPIST PROGRESS NOTE  Chelsea Hoover [id number]  Session Time: 9:00 - 10:00  Participation Level: Active  Behavioral Response: CasualAlertDepressed  Type of Therapy: Group Therapy  Treatment Goals addressed: Coping  Interventions: CBT, DBT, Supportive and Reframing  Summary: Clinician led check-in regarding current stressors and situation, and review of patient completed daily inventory. Clinician utilized active listening and empathetic response and validated patient emotions. Clinician facilitated processing group on pertinent issues.   Therapist Response:Chelsea Hoover is a 24 y.o. female who presents with depression and anxiety symptoms. Patient arrived within time allowed and reports that she is feeling "neutral." Patient rates hermood at a 5on a scale of 1-10 with 10 being great. Pt reports she didn't sleep well last night and is feeling tired. Pt reports yesterday was "fine" and she spent most of the day with her boyfriend. Pt reports struggling with passive SI throughout the day and managed it  by trying not to focus on them. Pt is able to process. Patient engaged in discussion.      Session Time: 10:00-11:00  Participation Level:Active  Behavioral Response:CasualAlertDepressed  Type of Therapy: Group Therapy, psychoeducation, psychotherapy  Treatment Goals addressed: Coping  Interventions:CBT, DBT, Solution Focused, Supportive and Reframing  Summary:Cln led discussion on ways to increase grace. Group members shared ways in which they experience judgment, guilt, or shame and how it affects them. Cln made connections between these feelings and overly harsh standards. Cln brought in topics of thought challenging, fact checking, and best friend test as ways to help alleviate these issues.   Therapist Response: Pt engaged in discussion and shared that grace is difficult for her in some areas more than others. Pt identifies relationships with her family and her mood as struggle points for her. Pt able to determine ways to work on it.        Session Time: 11:00- 12:00  Participation Level:Active  Behavioral Response:CasualAlertDepressed  Type of Therapy: Group Therapy, Psychoeducation; Psychotherapy  Treatment Goals addressed: Coping  Interventions:CBT; Solution focused; Supportive; Reframing  Summary:Cln introduced the DBT house activity. Cln explained purpose of the activity, to help identify areas to work on or pay attention to and highlight areas of gratitude. Cln led group through activity and group discussed the experience and their answers.    Therapist Response: Pt engaged in discussion and activity. Pt identifies struggling with thinking how to improve her emotions and what supports her. Pt able to process.        Session Time: 12:00 -1:00  Participation Level:Active  Behavioral Response:CasualAlertDepressed  Type of Therapy: Group Therapy, OT  Treatment Goals addressed: Coping  Interventions:Psychosocial  skills  training, Supportive,   Summary:12:00 - 12:50:Occupational Therapy group 12:50 -1:00 Clinician led check-out. Clinician assessed for immediate needs, medication compliance and efficacy, and safety concerns   Therapist Response:12:00 - 12:50:Patient engaged in group. See OT note.  12:50 - 1:00: At check-out, patient rates her mood at a 5 on a scale of 1-10 with 10 being great.Pt states afternoon plans of cleaning her room and car, trimming her hair, and washing the car. Patient demonstrates  someprogress as evidenced by participating in her first group session.Patient denies SI/HI/self-harm at the end of group.     Suicidal/Homicidal: Nowithout intent/plan  Plan: Pt will continue in PHP while working to decrease depression and anxiety symptoms and increase ability to manage symptoms in a healthy manner as they arise.   Diagnosis: Bipolar 1 disorder, depressed ([place]) [F31.9]    1. Bipolar 1 disorder, depressed ([place])       Chelsea Glass, LCSW 09/19/2019

## 2019-09-20 ENCOUNTER — Other Ambulatory Visit (HOSPITAL_COMMUNITY): Payer: BC Managed Care – PPO | Admitting: Licensed Clinical Social Worker

## 2019-09-20 ENCOUNTER — Other Ambulatory Visit: Payer: Self-pay

## 2019-09-20 DIAGNOSIS — F319 Bipolar disorder, unspecified: Secondary | ICD-10-CM

## 2019-09-21 ENCOUNTER — Other Ambulatory Visit (HOSPITAL_COMMUNITY): Payer: BC Managed Care – PPO

## 2019-09-21 ENCOUNTER — Other Ambulatory Visit (HOSPITAL_COMMUNITY): Payer: BC Managed Care – PPO | Admitting: Licensed Clinical Social Worker

## 2019-09-21 ENCOUNTER — Other Ambulatory Visit: Payer: Self-pay

## 2019-09-21 DIAGNOSIS — F319 Bipolar disorder, unspecified: Secondary | ICD-10-CM

## 2019-09-21 NOTE — Progress Notes (Signed)
Spoke with patient via Webex video call, used 2 identifiers to correctly identify patient. She states that groups are going well. She is sleeping now that she is taking her old dosage of Trazodone 100mg . Asking if she could take 75mg  so she won't be as sleepy during the day. She does have a pill splitter. Denies SI/HI or AV hallucinations. On scale 1-10 as 10 being worst she rates depression at 6/7 and anxiety at 6/7. No other issues or complaints.

## 2019-09-22 ENCOUNTER — Other Ambulatory Visit: Payer: Self-pay

## 2019-09-22 ENCOUNTER — Other Ambulatory Visit (HOSPITAL_COMMUNITY): Payer: BC Managed Care – PPO | Admitting: Licensed Clinical Social Worker

## 2019-09-22 ENCOUNTER — Telehealth (HOSPITAL_COMMUNITY): Payer: Self-pay | Admitting: Professional

## 2019-09-22 DIAGNOSIS — F319 Bipolar disorder, unspecified: Secondary | ICD-10-CM

## 2019-09-22 NOTE — Progress Notes (Signed)
Spiritual care group 09/21/2018 11:00-12:00  Group met via web-ex due to COVID-19 precautions.  Group facilitated by Simone Curia, MDiv, BCC   Group focused on topic of "self-care"  Patients engaged in facilitated discussion about topic.  Explored quotes related to self care and chose one which they agreed with and one which they disliked.  Engaged in discussion around quote choices and their experience / understanding of care for themselves.    Chelsea Hoover was present throughout group.  Engaged with facilitator and other group member.  Noted self care as challenging - describing feeling that she is being "lazy" or not doing what she is supposed to when doing something leisurely.  She spoke about the quote "To be yourself in a world that is constantly trying to make you something else is the greatest accomplishment"  Related to her departing [employer] and offered herself affirmation and comfort.  This quote felt intertwined with the quote  ""It's not selfish to love yourself, take care of yourself, and to make your happiness a priority. It's necessary"    Reflected on affirmation she has received from her aunt and therapist.

## 2019-09-23 ENCOUNTER — Other Ambulatory Visit (HOSPITAL_COMMUNITY): Payer: BC Managed Care – PPO | Admitting: Occupational Therapy

## 2019-09-23 ENCOUNTER — Other Ambulatory Visit: Payer: Self-pay

## 2019-09-23 ENCOUNTER — Encounter (HOSPITAL_COMMUNITY): Payer: Self-pay | Admitting: Occupational Therapy

## 2019-09-23 ENCOUNTER — Other Ambulatory Visit (HOSPITAL_COMMUNITY): Payer: BC Managed Care – PPO | Admitting: Licensed Clinical Social Worker

## 2019-09-23 DIAGNOSIS — F319 Bipolar disorder, unspecified: Secondary | ICD-10-CM

## 2019-09-23 DIAGNOSIS — F3132 Bipolar disorder, current episode depressed, moderate: Secondary | ICD-10-CM

## 2019-09-23 DIAGNOSIS — R4589 Other symptoms and signs involving emotional state: Secondary | ICD-10-CM

## 2019-09-23 NOTE — Therapy (Signed)
[facility] PARTIAL HOSPITALIZATION PROGRAM [address] [place], [place], [address] Phone: [phone]   Fax:  [phone]  Occupational Therapy Treatment  Patient Details  Name: Rachelle Edwards MRN: [id number] Date of Birth: 1996-06-04 Referring Provider (OT): Hillery Jacks, NP  Virtual Visit via Video Note  I connected with [name] on 09/23/19 at  8:00 AM EST by a video enabled telemedicine application and verified that I am speaking with the correct person using two identifiers.   I discussed the limitations of evaluation and management by telemedicine and the availability of in person appointments. The patient expressed understanding and agreed to proceed.   I discussed the assessment and treatment plan with the patient. The patient was provided an opportunity to ask questions and all were answered. The patient agreed with the plan and demonstrated an understanding of the instructions.   The patient was advised to call back or seek an in-person evaluation if the symptoms worsen or if the condition fails to improve as anticipated.  I provided 60 minutes of non-face-to-face time during this encounter.   Dalphine Handing, OT    Encounter Date: 09/23/2019  OT End of Session - 09/23/19 1656    Visit Number  2    Number of Visits  12    Date for OT Re-Evaluation  10/14/19    Authorization Type  [employer]    OT Start Time  1100    OT Stop Time  1200    OT Time Calculation (min)  60 min    Activity Tolerance  Patient tolerated treatment well    Behavior During Therapy  WFL for tasks assessed/performed       Past Medical History:  Diagnosis Date  . Anxiety   . 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 Bipolar 1 disorder (HCC)   . Cannabis abuse 09/03/2015  . Depression   . Irritable bowel syndrome   . Lyme disease   . Mood disorder (HCC)   . PTSD (post-traumatic stress disorder) 09/01/2015    Past Surgical History:  Procedure Laterality  Date  . TONSILECTOMY/ADENOIDECTOMY WITH MYRINGOTOMY Bilateral    August 2020    There were no vitals filed for this visit.  Subjective Assessment - 09/23/19 1656    Currently in Pain?  No/denies           S: I used to use substance use as a negative coping skill  O: Education given on the definition of stress and its cognitive, behavioral, emotional, and physical effects on the body. Stress management tool worksheet discussed to educate on unhealthy vs healthy coping skills to manage stress to improve community integration. Coping strategies taught include: relaxation based- deep breathing, counting to 10, taking a 1 minute vacation, acceptance, stress balls, relaxation audio/video, visual/mental imagery.  A: Pt presents with blunted affect, engaged and participatory throughout session. She states how she had previously had the negative coping skill of substance use. She also states that she suppresses her stress. She wants to improve this by improving her scheduled self care. She plans to do this later this afternoon.  P: OT group will be x2 per week while pt is in [facility]             OT Education - 09/23/19 1656    Education Details  education given on stress management    Person(s) Educated  Patient    Methods  Explanation;Handout    Comprehension  Verbalized understanding  OT Short Term Goals - 09/23/19 1659      OT SHORT TERM GOAL #1   Title  Pt will be educated on strategies to improve psychosocial skills needed to participate fully in all daily, work, and leisure activities    Time  4    Period  Weeks    Status  On-going    Target Date  10/14/19      OT SHORT TERM GOAL #2   Title  Pt will apply psychosocial skills and coping mechanisms to daily activities in order to function independently and reintegrate into community    Time  4    Period  Weeks    Status  New      OT SHORT TERM GOAL #3   Title  Pt will engage in goal setting to improve functional  BADL/IADL routine upon reintegrating into community.    Time  4    Period  Weeks    Status  New      OT SHORT TERM GOAL #4   Title  Pt will identify 1-3 new job opportunities in current vocational search for improved community integration    Time  Linda - 09/23/19 1659    Occupational performance deficits (Please refer to evaluation for details):  ADL's;IADL's;Rest and Sleep;Work;Leisure;Social Participation    Body Structure / Function / Physical Skills  ADL;IADL    Cognitive Skills  Emotional;Energy/Drive    Psychosocial Skills  Coping Strategies;Routines and Behaviors;Habits;Interpersonal Interaction       Patient will benefit from skilled therapeutic intervention in order to improve the following deficits and impairments:   Body Structure / Function / Physical Skills: ADL, IADL Cognitive Skills: Emotional, Energy/Drive Psychosocial Skills: Coping Strategies, Routines and Behaviors, Habits, Interpersonal Interaction   Visit Diagnosis: Bipolar affective disorder, currently depressed, moderate ([place])  Difficulty coping    Problem List Patient Active Problem List   Diagnosis Date Noted  . Bipolar 1 disorder ([place]) 09/07/2019  . Cannabis abuse 09/03/2015  . Bipolar 1 disorder, depressed ([place]) 09/01/2015  . 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 PTSD (post-traumatic stress disorder) 09/01/2015   Zenovia Jarred, MSOT, OTR/L  [place] 09/23/2019, 5:00 PM  [place], [place], [address] Phone: [phone]   Fax:  [phone]  Name: Janey Petron MRN: [id number] Date of Birth: Oct 23, 1995

## 2019-09-24 ENCOUNTER — Other Ambulatory Visit: Payer: Self-pay

## 2019-09-24 ENCOUNTER — Other Ambulatory Visit (HOSPITAL_COMMUNITY): Payer: BC Managed Care – PPO | Admitting: Licensed Clinical Social Worker

## 2019-09-24 ENCOUNTER — Other Ambulatory Visit (HOSPITAL_COMMUNITY): Payer: BC Managed Care – PPO

## 2019-09-24 DIAGNOSIS — F319 Bipolar disorder, unspecified: Secondary | ICD-10-CM

## 2019-09-25 ENCOUNTER — Encounter (HOSPITAL_COMMUNITY): Payer: Self-pay | Admitting: Family

## 2019-09-25 MED ORDER — TRAZODONE HCL 50 MG PO TABS: 75.0000 mg | ORAL_TABLET | Freq: Every evening | ORAL | 0 refills | Status: DC | PRN

## 2019-09-25 NOTE — Progress Notes (Signed)
Virtual Visit via Video Note  I connected with Chelsea Hoover on 09/24/19 at  9:00 AM EST by a video enabled telemedicine application and verified that I am speaking with the correct person using two identifiers.   I discussed the limitations of evaluation and management by telemedicine and the availability of in person appointments. The patient expressed understanding and agreed to proceed.    I discussed the assessment and treatment plan with the patient. The patient was provided an opportunity to ask questions and all were answered. The patient agreed with the plan and demonstrated an understanding of the instructions.   The patient was advised to call back or seek an in-person evaluation if the symptoms worsen or if the condition fails to improve as anticipated.  I provided 15 minutes of non-face-to-face time during this encounter.   Derrill Center, NP   [facility] MD/PA/NP OP Progress Note  09/25/2019 9:38 AM Chelsea Hoover  MRN:  [id number]  Evaluation: Chelsea Hoover was seen and evaluated via teleassessment.  She presents flat and depressed, however reactive with question and dose smile and is reactive at appropriate time during this assessment. Reported her mood has been down since being triggered by information that was discussed during group sessions.  States she had thoughts of self-harm. However she denied that she acted on her thoughts.  Stated " I just needed to process the information and we didn't have time to process my feelings."  Chelsea Hoover is currently denying suicidal or homicidal ideations.  Denies auditory or visual hallucinations.  Stated her depression 4 out of 10 with 10 being the worst.  Reported taking medications as prescribed.  States tolerating well.  Patient reports utilizing coping skills with coloring which is helped her through " bad thinking."  Patient cites feeling oversedated with trazodone 100 mg throughout the day.  States she has been breaking her medications in half  where she is roughly given herself 75 mg which has been helpful.  This NP discussed self-medicating and titration.  Will provide a prescription for 75 mg patient to follow-up with NP regarding side effects.  Patient was receptive to plan.   Chelsea Hoover reported her current boyfriend has been supportive.  Denied any other concerns during this assessment.  Reported a good appetite.  Support encouragement reassurance was provided.   Visit Diagnosis: No diagnosis found.  Past Psychiatric History:   Past Medical History:  Past Medical History:  Diagnosis Date  . Anxiety   . 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 Bipolar 1 disorder ([place])   . Cannabis abuse 09/03/2015  . Depression   . Irritable bowel syndrome   . Lyme disease   . Mood disorder ([place])   . PTSD (post-traumatic stress disorder) 09/01/2015    Past Surgical History:  Procedure Laterality Date  . TONSILECTOMY/ADENOIDECTOMY WITH MYRINGOTOMY Bilateral    August 2020    Family Psychiatric History:   Family History:  Family History  Problem Relation Age of Onset  . Crohn's disease Father   . Bipolar disorder Maternal Aunt   . Schizophrenia Maternal Grandfather   . Bipolar disorder Maternal Grandfather     Social History:  Social History   Socioeconomic History  . Marital status: Single    Spouse name: Not on file  . Number of children: Not on file  . Years of education: Not on file  . Highest education level: Not on file  Occupational History  . Not on file  Tobacco Use  . Smoking status: Never Smoker  .  Smokeless tobacco: Never Used  Substance and Sexual Activity  . Alcohol use: No  . Drug use: Yes    Types: Marijuana    Comment: Few times a week  . Sexual activity: Yes    Birth control/protection: Pill  Other Topics Concern  . Not on file  Social History Narrative  . Not on file   Social Determinants of Health   Financial Resource Strain:   . Difficulty of Paying Living Expenses: Not on file   Food Insecurity:   . Worried About Programme researcher, broadcasting/film/video in the Last Year: Not on file  . Ran Out of Food in the Last Year: Not on file  Transportation Needs:   . Lack of Transportation (Medical): Not on file  . Lack of Transportation (Non-Medical): Not on file  Physical Activity:   . Days of Exercise per Week: Not on file  . Minutes of Exercise per Session: Not on file  Stress:   . Feeling of Stress : Not on file  Social Connections:   . Frequency of Communication with Friends and Family: Not on file  . Frequency of Social Gatherings with Friends and Family: Not on file  . Attends Religious Services: Not on file  . Active Member of Clubs or Organizations: Not on file  . Attends Banker Meetings: Not on file  . Marital Status: Not on file    Allergies:  Allergies  Allergen Reactions  . Lactose Intolerance (Gi) Other (See Comments)    Pain in stomach  . Oxycodone Other (See Comments)    Intense vomiting  . Latex Rash    Metabolic Disorder Labs: Lab Results  Component Value Date   HGBA1C 5.3 09/07/2019   MPG 105 09/07/2019   MPG 120 09/01/2015   No results found for: PROLACTIN Lab Results  Component Value Date   CHOL 166 09/07/2019   TRIG 72 09/07/2019   HDL 63 09/07/2019   CHOLHDL 2.6 09/07/2019   VLDL 14 09/07/2019   LDLCALC 89 09/07/2019   LDLCALC 62 09/01/2015   Lab Results  Component Value Date   TSH 3.841 09/07/2019   TSH 1.707 09/01/2015    Therapeutic Level Labs: No results found for: LITHIUM No results found for: VALPROATE No components found for:  CBMZ  Current Medications: Current Outpatient Medications  Medication Sig Dispense Refill  . ARIPiprazole (ABILIFY) 10 MG tablet Take 1 tablet (10 mg total) by mouth daily. 30 tablet 0  . buPROPion (WELLBUTRIN XL) 150 MG 24 hr tablet Take 1 tablet (150 mg total) by mouth daily. 30 tablet 0  . Colloidal Oatmeal (AVEENO ECZEMA THERAPY) 1 % CREA Apply 1 application topically as needed (For  eczema.).    Marland Kitchen famotidine (PEPCID) 20 MG tablet Take 1 tablet (20 mg total) by mouth 2 (two) times daily. 60 tablet 0  . hydrOXYzine (VISTARIL) 25 MG capsule Take 25 mg by mouth 3 (three) times daily as needed.    . lamoTRIgine (LAMICTAL) 150 MG tablet Take 1 tablet (150 mg total) by mouth 2 (two) times daily. 60 tablet 0  . Multiple Vitamin (MULTIVITAMIN WITH MINERALS) TABS tablet Take 1 tablet by mouth daily.    . pantoprazole (PROTONIX) 40 MG tablet Take 1 tablet (40 mg total) by mouth daily. 30 tablet 0  . traZODone (DESYREL) 50 MG tablet Take 1 tablet (50 mg total) by mouth at bedtime as needed for sleep. 30 tablet 0   No current facility-administered medications for this visit.  Musculoskeletal:   Psychiatric Specialty Exam: Review of Systems  Last menstrual period 09/07/2019.There is no height or weight on file to calculate BMI.  General Appearance: Casual  Eye Contact:  Fair  Speech:  Clear and Coherent  Volume:  Normal  Mood:  Anxious and Depressed  Affect:  Congruent  Thought Process:  Coherent  Orientation:  Full (Time, Place, and Person)  Thought Content: Logical   Suicidal Thoughts:  No passive ideations   Homicidal Thoughts:  No  Memory:  Immediate;   Fair Recent;   Fair  Judgement:  Fair  Insight:  Fair  Psychomotor Activity:  Normal  Concentration:  Concentration: Fair  Recall:  [employer] of Knowledge: Fair  Language: Fair  Akathisia:  No  Handed:  Right  AIMS (if indicated):  Assets:  Communication Skills Desire for Improvement Resilience Social Support  ADL's:  Intact  Cognition: WNL  Sleep:  Fair   Screenings: AIMS     [facility] (Dis[facility]) [facility] Ad[facility] (Dis[facility]) [facility]  AIMS Total Score  0  0    AUDIT     Admission (Discharged) [facility]   Alcohol Use Disorder Identification Test Final Score (AUDIT)  11    PHQ2-9     [facility]  PHQ-2 Total Score  4  PHQ-9 Total Score  18       Assessment and Plan:  Continue partial hospitalization programming Order trazodone 75 mg p.o. nightly as needed   Treatment plan was reviewed and agreed upon by NP T. Lewis inpatient [employer] need for continued group services    [name], NP 09/25/2019, 9:38 AM

## 2019-09-27 ENCOUNTER — Other Ambulatory Visit (HOSPITAL_COMMUNITY): Payer: BC Managed Care – PPO | Admitting: Licensed Clinical Social Worker

## 2019-09-27 ENCOUNTER — Other Ambulatory Visit: Payer: Self-pay

## 2019-09-27 DIAGNOSIS — F319 Bipolar disorder, unspecified: Secondary | ICD-10-CM

## 2019-09-28 ENCOUNTER — Other Ambulatory Visit: Payer: Self-pay

## 2019-09-28 ENCOUNTER — Other Ambulatory Visit (HOSPITAL_COMMUNITY): Payer: BC Managed Care – PPO | Admitting: Licensed Clinical Social Worker

## 2019-09-28 ENCOUNTER — Encounter (HOSPITAL_COMMUNITY): Payer: Self-pay | Admitting: Occupational Therapy

## 2019-09-28 ENCOUNTER — Other Ambulatory Visit (HOSPITAL_COMMUNITY): Payer: BC Managed Care – PPO | Admitting: Occupational Therapy

## 2019-09-28 DIAGNOSIS — F3132 Bipolar disorder, current episode depressed, moderate: Secondary | ICD-10-CM

## 2019-09-28 DIAGNOSIS — F319 Bipolar disorder, unspecified: Secondary | ICD-10-CM

## 2019-09-28 DIAGNOSIS — R4589 Other symptoms and signs involving emotional state: Secondary | ICD-10-CM

## 2019-09-28 NOTE — Psych (Signed)
Virtual Visit via Video Hoover  I connected with Chelsea Hoover on 09/20/19 at  9:00 AM EST by a video enabled telemedicine application and verified that I am speaking with the correct person using two identifiers.   I discussed the limitations of evaluation and management by telemedicine and the availability of in person appointments. The patient expressed understanding and agreed to proceed.  I discussed the assessment and treatment plan with the patient. The patient was provided an opportunity to ask questions and all were answered. The patient agreed with the plan and demonstrated an understanding of the instructions.   The patient was advised to call back or seek an in-person evaluation if the symptoms worsen or if the condition fails to improve as anticipated.  Pt was provided 240 minutes of non-face-to-face time during this encounter.   Chelsea Guiles, LCSW    Chelsea Hoover  Chelsea Hoover [id number]  Session Time: 9:00 - 10:00  Participation Level: Active  Behavioral Response: CasualAlertDepressed  Type of Therapy: Individual Therapy  Treatment Goals addressed: Coping  Interventions: CBT, DBT, Supportive and Reframing  Summary: Clinician led check-in regarding current stressors and situation, and review of patient completed daily inventory. Clinician utilized active listening and empathetic response and validated patient emotions. Clinician facilitated processing group on pertinent issues.   Therapist Response:Chelsea Hoover is a 24 y.o. female who presents with depression and anxiety symptoms. Patient arrived within time allowed and reports that she is feeling "pretty neutral." Patient rates hermood at a 5on a scale of 1-10 with 10 being great. Pt reports her weekend went "okay" and she focused on distracting herself from her sister's health. Pt states spending most of the weekend at her boyfriend's because it is a "safe place." Pt states she drank  alcohol for the first time since going into the hospital, 2.5 malt liquor drinks. Pt states it was not difficult for her to manage and she was in a social setting. Pt is able to process. Patient engaged in discussion.      Session Time: 10:00-11:00  Participation Level:Active  Behavioral Response:CasualAlertDepressed  Type of Therapy: Individual Therapy  Treatment Goals addressed: Coping  Interventions:CBT, DBT, Solution Focused, Supportive and Reframing  Summary:Cln led discussion on work life balance, specifically in terms of mental health. Cln engaged in discussion with pt regarding pt's job and how she felt it fit her current needs. Cln and pt verbally reviewed pros and cons and discussed alternative solutions for achieving a better work-life balance.   Therapist Response: Pt reports feeling "stressed out" about work and finances. Pt states she does not feel her current job is conducive to taking good care of herself. Pt states the type of work does not suit her personality and therefore she feels anxious the majority of the time. Pt processes through pros and cons and is able to weigh some options. Pt brainstormed some other options for jobs and how to meet her financial needs currently while out of work for treatment.       Session Time: 11:00- 12:00  Participation Level:Active  Behavioral Response:CasualAlertDepressed  Type of Therapy: Individual Therapy  Treatment Goals addressed: Coping  Interventions:CBT, DBT, Solution Focused, Supportive and Reframing  Summary:Cln led discussion on self-esteem and how low self-esteem can cause barriers in our lives. Cln worked with pt to identify ways in which pt's self-esteem is limiting her and ways to address it.      Therapist Response: Pt engaged in discussion and demonstrates insight into struggles  with her self-esteem. Pt is able to recognize factors to negative self-esteem and has difficulty  connecting the symptoms of low self-esteem in her life. Pt shares concerns about her relationship and difficulties with her family.     Session Time: 12:00 -1:00  Participation Level:Active  Behavioral Response:CasualAlertDepressed  Type of Therapy: Individual Therapy  Treatment Goals addressed: Coping  Interventions:CBT; Solution focused; Supportive; Reframing  Summary:12:00 - 12:50:Cln introduced topic of core beliefs. Cln utilized CBT framework to discuss negative core beliefs, how they are formed, and how they impact our perceptions. Cln worked with pt to identify negative core beliefs.     Therapist Response:12:00 - 12:50:Pt engaged in discussion and reports understanding of core beliefs. Pt is able to identify negative core beliefs of "I'm not enough" and perfectionism.  12:50 - 1:00: At check-out, patient rates her mood at a 7 on a scale of 1-10 with 10 being great.Pt states afternoon plans of organizing her room and spending time with her sister. Patient demonstrates  someprogress as evidenced by increased ability to manage mood.Patient denies SI/HI/self-harm at the end of group.     Suicidal/Homicidal: Nowithout intent/plan  Plan: Pt will continue in PHP while working to decrease depression and anxiety symptoms and increase ability to manage symptoms in a healthy manner as they arise.   Diagnosis: Bipolar 1 disorder, depressed ([place]) [F31.9]    1. Bipolar 1 disorder, depressed ([place])       Chelsea Glass, LCSW 09/28/2019

## 2019-09-28 NOTE — Therapy (Signed)
[facility] PARTIAL HOSPITALIZATION PROGRAM [address] [place], [place], [address] Phone: [phone]   Fax:  [phone]  Occupational Therapy Treatment  Patient Details  Name: Chelsea Hoover MRN: [id number] Date of Birth: 1996/04/14 Referring Provider (OT): Hillery Jacks, NP  Virtual Visit via Video Note  I connected with Chelsea Hoover on 09/28/19 at  8:00 AM EST by a video enabled telemedicine application and verified that I am speaking with the correct person using two identifiers.   I discussed the limitations of evaluation and management by telemedicine and the availability of in person appointments. The patient expressed understanding and agreed to proceed.   I discussed the assessment and treatment plan with the patient. The patient was provided an opportunity to ask questions and all were answered. The patient agreed with the plan and demonstrated an understanding of the instructions.   The patient was advised to call back or seek an in-person evaluation if the symptoms worsen or if the condition fails to improve as anticipated.  I provided 60 minutes of non-face-to-face time during this encounter.   Dalphine Handing, OT    Encounter Date: 09/28/2019  OT End of Session - 09/28/19 1606    Visit Number  3    Number of Visits  12    Date for OT Re-Evaluation  10/14/19    Authorization Type  [employer]    OT Start Time  1200    OT Stop Time  1300    OT Time Calculation (min)  60 min    Activity Tolerance  Patient tolerated treatment well    Behavior During Therapy  WFL for tasks assessed/performed       Past Medical History:  Diagnosis Date  . Anxiety   . 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 Bipolar 1 disorder ([facility])   . Cannabis abuse 09/03/2015  . Depression   . Irritable bowel syndrome   . Lyme disease   . Mood disorder ([facility])   . PTSD (post-traumatic stress disorder) 09/01/2015    Past Surgical History:  Procedure Laterality  Date  . TONSILECTOMY/ADENOIDECTOMY WITH MYRINGOTOMY Bilateral    August 2020    There were no vitals filed for this visit.  Subjective Assessment - 09/28/19 1606    Currently in Pain?  No/denies          S: I want to work on Editor, commissioning and finding a new job   O: Education given on self-accountability being in line with personal values and goals to maintain occupational balance in various community settings. Pt given goal identifying worksheet to list immediate, short term, medium term, and long-term goals using a SMART goal framework (specificity, meaningful, adaptive, realistic, and time bound). Goals created as guideline for pt to practice being accountable in various situations. Pt completed work sheet of goals and encouraged to share goals with the group, with emphasis on immediate goal for check in with pt for next session to maintain accountability.   A: Pt presents with blunted affect, engaged and participatory throughout session. She shares how she has been working on her vocational skills by having already submitted an application to a new job. Pt shares goals around continuing applications and searching for vocational interests. She also has goal centered around making a budget, exercise plan, and finding her own place to live. 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 Her immediate goal today is to shower and complete a [employer] shift.  P: OT group will  be x2 per week while pt in [facility]           OT Education - 09/28/19 1606    Education Details  education given on SMART goals    Person(s) Educated  Patient    Methods  Explanation;Handout    Comprehension  Verbalized understanding       OT Short Term Goals - 09/23/19 1659      OT SHORT TERM GOAL #1   Title  Pt will be educated on strategies to improve psychosocial skills needed to participate fully in all daily, work, and leisure activities    Time  4    Period  Weeks    Status   On-going    Target Date  10/14/19      OT SHORT TERM GOAL #2   Title  Pt will apply psychosocial skills and coping mechanisms to daily activities in order to function independently and reintegrate into community    Time  4    Period  Weeks    Status  New      OT SHORT TERM GOAL #3   Title  Pt will engage in goal setting to improve functional BADL/IADL routine upon reintegrating into community.    Time  4    Period  Weeks    Status  New      OT SHORT TERM GOAL #4   Title  Pt will identify 1-3 new job opportunities in current vocational search for improved community integration    Time  [place] - 09/28/19 1607    Occupational performance deficits (Please refer to evaluation for details):  ADL's;IADL's;Rest and Sleep;Work;Leisure;Social Participation    Body Structure / Function / Physical Skills  ADL;IADL    Cognitive Skills  Emotional;Energy/Drive    Psychosocial Skills  Coping Strategies;Routines and Behaviors;Habits;Interpersonal Interaction       Patient will benefit from skilled therapeutic intervention in order to improve the following deficits and impairments:   Body Structure / Function / Physical Skills: ADL, IADL Cognitive Skills: Emotional, Energy/Drive Psychosocial Skills: Coping Strategies, Routines and Behaviors, Habits, Interpersonal Interaction   Visit Diagnosis: Bipolar affective disorder, currently depressed, moderate ([place])  Difficulty coping    Problem List Patient Active Problem List   Diagnosis Date Noted  . Bipolar 1 disorder ([place]) 09/07/2019  . Cannabis abuse 09/03/2015  . Bipolar 1 disorder, depressed ([place]) 09/01/2015  . 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 PTSD (post-traumatic stress disorder) 09/01/2015   Zenovia Jarred, MSOT, OTR/L  [place] 09/28/2019, 4:08 PM  [facility] PARTIAL HOSPITALIZATION PROGRAM [address] [place], [place],  [address] Phone: [phone]   Fax:  [phone]  Name: Chelsea Hoover MRN: [id number] Date of Birth: 01-16-1996

## 2019-09-28 NOTE — Psych (Signed)
Virtual Visit via Video Note  I connected with [name] on 09/17/19 at  9:00 AM EST by a video enabled telemedicine application and verified that I am speaking with the correct person using two identifiers.   I discussed the limitations of evaluation and management by telemedicine and the availability of in person appointments. The patient expressed understanding and agreed to proceed.  I discussed the assessment and treatment plan with the patient. The patient was provided an opportunity to ask questions and all were answered. The patient agreed with the plan and demonstrated an understanding of the instructions.   The patient was advised to call back or seek an in-person evaluation if the symptoms worsen or if the condition fails to improve as anticipated.  Pt was provided 240 minutes of non-face-to-face time during this encounter.   [name], LCSW    [facility] PHP THERAPIST PROGRESS NOTE  Avacyn Kloosterman [id number]  Session Time: 9:00 - 10:00  Participation Level: Active  Behavioral Response: CasualAlertDepressed  Type of Therapy: Group Therapy  Treatment Goals addressed: Coping  Interventions: CBT, DBT, Supportive and Reframing  Summary: Clinician led check-in regarding current stressors and situation, and review of patient completed daily inventory. Clinician utilized active listening and empathetic response and validated patient emotions. Clinician facilitated processing group on pertinent issues.   Therapist Response:Mozel Maggi is a 24 y.o. female who presents with depression and anxiety symptoms. Patient arrived within time allowed and reports that she is feeling "not good." Patient rates hermood at a 2on a scale of 1-10 with 10 being great. Pt reports her sister went to the hospital yesterday due to a kidney infection and pt is very worried about her. Pt states she tried to distract herself but night was difficult and she struggled with rumination and  passive SI. Pt is able to process. Patient engaged in discussion.      Session Time: 10:00-11:00  Participation Level:Active  Behavioral Response:CasualAlertDepressed  Type of Therapy: Group Therapy, psychoeducation, psychotherapy  Treatment Goals addressed: Coping  Interventions:CBT, DBT, Solution Focused, Supportive and Reframing  Summary:Cln led processing group, creating space for pt's to discuss emotionally tasking situations from the day before. Group members provided support to one another. Cln used clarification and probing questions to increase positive perspectives and issued thought challenges as needed.   Therapist Response: Pt participted in discussion and shares fears for her sister. Pt reports her sister has many health issues and pt is struggling with catastrophic thinking. Pt able to process, reframe, and accept support.       Session Time: 11:00- 12:00  Participation Level:Active  Behavioral Response:CasualAlertDepressed  Type of Therapy: Group Therapy, psychoeducation, psychotherapy  Treatment Goals addressed: Coping  Interventions:CBT, DBT, Solution Focused, Supportive and Reframing  Summary:Cln led discussion on control. Group members discussed ways in which feeling out of control increases emotional distress. Cln reviewed radical acceptance and how focusing on what we can control increases sense of personal agency.     Therapist Response: Pt engaged in discussion. Pt states not knowing the future highlights control issues and increases anxiety. Pt continues to be focused on her sister's health and relates topic to fear for her health and future. Pt able to process.       Session Time: 12:00 -1:00  Participation Level:Active  Behavioral Response:CasualAlertDepressed  Type of Therapy: Group Therapy, Psychoeducation; Psychotherapy  Treatment Goals addressed: Coping  Interventions:CBT; Solution  focused; Supportive; Reframing  Summary:12:00 - 12:50:Cln introduced topic of rejection. Group viewed TED talk "What I  learned from 100 days of rejection" to facilitate discussion. Group shared ways in which they feel rejection is an issue for them and what level they feel they need to address the struggles.    Therapist Response:12:00 - 12:50:Pt engaged in discussion and reports opening up to people is difficult for her due to fear of rejection. Pt is able to identify steps to work on rejection.  12:50 - 1:00: At check-out, patient rates her mood at a 6.5 on a scale of 1-10 with 10 being great.Pt states afternoon plans of seeing a friend and spending time with her boyfriend. Patient demonstrates  someprogress as evidenced by increased openness.Patient denies SI/HI/self-harm at the end of group.     Suicidal/Homicidal: Nowithout intent/plan  Plan: Pt will continue in PHP while working to decrease depression and anxiety symptoms and increase ability to manage symptoms in a healthy manner as they arise.   Diagnosis: Bipolar affective disorder, currently depressed, moderate (HCC) [F31.32]    1. Bipolar affective disorder, currently depressed, moderate (HCC)       [name], LCSW 09/28/2019

## 2019-09-28 NOTE — Progress Notes (Signed)
Spoke with patient via Webex video call, used 2 identifiers to correctly identify patient. She is enjoying groups. Sleeping better once she gets to sleep but having trouble falling asleep since she stopped smoking marijuana. Medications are doing well for her and no side effects noted. Just some restlessness but she believes this is due to stopping the marijuana. On scale 1-10 as 10 being worst she rates depression at 5 and anxiety at 7. Denies SI/HI or AV hallucinations. No other issues or complaints. Not expecting discharge until next week.

## 2019-09-29 ENCOUNTER — Other Ambulatory Visit (HOSPITAL_COMMUNITY): Payer: BC Managed Care – PPO

## 2019-09-29 ENCOUNTER — Other Ambulatory Visit: Payer: Self-pay

## 2019-09-29 NOTE — Psych (Signed)
Virtual Visit via Video Note  I connected with [name] on 09/21/19 at  9:00 AM EST by a video enabled telemedicine application and verified that I am speaking with the correct person using two identifiers.   I discussed the limitations of evaluation and management by telemedicine and the availability of in person appointments. The patient expressed understanding and agreed to proceed.  I discussed the assessment and treatment plan with the patient. The patient was provided an opportunity to ask questions and all were answered. The patient agreed with the plan and demonstrated an understanding of the instructions.   The patient was advised to call back or seek an in-person evaluation if the symptoms worsen or if the condition fails to improve as anticipated.  Pt was provided 240 minutes of non-face-to-face time during this encounter.   [name], LCSW    [facility] PHP THERAPIST PROGRESS NOTE  Amrita Radu [id number]  Session Time: 9:00 - 10:00  Participation Level: Active  Behavioral Response: CasualAlertDepressed  Type of Therapy: Group Therapy  Treatment Goals addressed: Coping  Interventions: CBT, DBT, Supportive and Reframing  Summary: Clinician led check-in regarding current stressors and situation, and review of patient completed daily inventory. Clinician utilized active listening and empathetic response and validated patient emotions. Clinician facilitated processing group on pertinent issues.   Therapist Response:Prisilla Southgate is a 24 y.o. female who presents with depression and anxiety symptoms. Patient arrived within time allowed and reports that she is feeling "down and tired." Patient rates hermood at a 5on a scale of 1-10 with 10 being great. Pt reports she went to sleep late and it was difficult to get up this morning. Pt states her afternoon went well and she spent time with her sisters and boyfriend. Pt shares she later had an "interesting"  conversation with her boyfriend that made her feel "not great" and she struggled to shake the mood. Pt is able to process. Patient engaged in discussion.      Session Time: 10:00-11:00  Participation Level:Active  Behavioral Response:CasualAlertDepressed  Type of Therapy: Group Therapy, psychoeducation, psychotherapy  Treatment Goals addressed: Coping  Interventions:CBT, DBT, Solution Focused, Supportive and Reframing  Summary:Cln led discussion on the effects of putting other people's needs before our own. Cln utilized boundary tenets to highlight ways to add structure to relationships. Pt's discussed ways in which they struggle to put themselves first.  Therapist Response: Pt engaged in discussion and shared that making her needs a priority is a struggle for her. Pt gains insight into ways that she has been putting others first and states she was unaware how frequently this was an issue for herself.        Session Time: 11:00- 12:00  Participation Level:Active  Behavioral Response:CasualAlertDepressed  Type of Therapy: Group Therapy, Psychoeducation; Psychotherapy  Treatment Goals addressed: Coping  Interventions:CBT; Solution focused; Supportive; Reframing  Summary:Cln introduced topic of cognitive distortions. Cln discussed what cognitive distortions are and how working on them can improve mental health. Cln introduced behavior modification model catch, challenge, change and how it can be used to change habits such as unhealthy thought patterns.    Therapist Response: Pt engaged in discussion and reports understanding of cognitive distortions and catch challenge change.        Session Time: 12:00 -1:00  Participation Level:Active  Behavioral Response:CasualAlertDepressed  Type of Therapy: Group Therapy, OT  Treatment Goals addressed: Coping  Interventions:Psychosocial skills training, Supportive,    Summary:12:00 - 12:50:Occupational Therapy group 12:50 -1:00 Clinician led check-out. Clinician  assessed for immediate needs, medication compliance and efficacy, and safety concerns   Therapist Response:12:00 - 12:50:Patient engaged in group. See OT note.  12:50 - 1:00: At check-out, patient rates her mood at a 6.5 on a scale of 1-10 with 10 being great.Pt states afternoon plans of doing laundry, washing her car, and organizing her room. Patient demonstrates  someprogress as evidenced by reporting decreased passive SI.Patient denies SI/HI/self-harm at the end of group.     Suicidal/Homicidal: Nowithout intent/plan  Plan: Pt will continue in PHP while working to decrease depression and anxiety symptoms and increase ability to manage symptoms in a healthy manner as they arise.   Diagnosis: Bipolar 1 disorder, depressed (HCC) [F31.9]    1. Bipolar 1 disorder, depressed (HCC)       [name], LCSW 09/29/2019

## 2019-09-29 NOTE — Psych (Signed)
Virtual Visit via Video Note  I connected with Chelsea Hoover on 09/22/19 at  9:00 AM EST by a video enabled telemedicine application and verified that I am speaking with the correct person using two identifiers.   I discussed the limitations of evaluation and management by telemedicine and the availability of in person appointments. The patient expressed understanding and agreed to proceed.  I discussed the assessment and treatment plan with the patient. The patient was provided an opportunity to ask questions and all were answered. The patient agreed with the plan and demonstrated an understanding of the instructions.   The patient was advised to call back or seek an in-person evaluation if the symptoms worsen or if the condition fails to improve as anticipated.  Pt was provided 240 minutes of non-face-to-face time during this encounter.   [name], LCSW    [facility] PHP THERAPIST PROGRESS NOTE  Chelsea Hoover [id number]  Session Time: 9:00 - 10:00  Participation Level: Active  Behavioral Response: CasualAlertDepressed  Type of Therapy: Group Therapy  Treatment Goals addressed: Coping  Interventions: CBT, DBT, Supportive and Reframing  Summary: Clinician led check-in regarding current stressors and situation, and review of patient completed daily inventory. Clinician utilized active listening and empathetic response and validated patient emotions. Clinician facilitated processing group on pertinent issues.   Therapist Response:Chelsea Hoover is a 24 y.o. female who presents with depression and anxiety symptoms. Patient arrived within time allowed and reports that she is feeling "down in general." Patient rates hermood at a 4on a scale of 1-10 with 10 being great. Pt reports "overall yesterday wasn't great" and that she feels "sad." Pt states she had a contentious dinner with her family which was draining and later had a difficult conversation with her boyfriend due to  feeling he was being critical of menta health treatment. Pt struggles to manage mood. Pt is able to process. Patient engaged in discussion.      Session Time: 10:00-11:00  Participation Level:Active  Behavioral Response:CasualAlertDepressed  Type of Therapy: Group Therapy, psychoeducation, psychotherapy  Treatment Goals addressed: Coping  Interventions:CBT, DBT, Solution Focused, Supportive and Reframing  Summary:Cln led discussion on assertiveness and how it can be used to manage difficult situations. Cln introduced the broken-record technique and how to practice it. Group members shared ways in which they could utilize this technique and practiced with situations in which they are struggling.  Therapist Response: Pt engaged in discussion and reports her family can be aggressive and broken record technique could be beneficial with them.       Session Time: 11:00- 12:00  Participation Level: Active  Behavioral Response: CasualAlertDepressed  Type of Therapy: Group Therapy, psychotherapy  Treatment Goals addressed: Coping  Interventions: Strengths based, reframing, Supportive,   Summary:  Spiritual Care group  Therapist Response: Patient engaged in group. See chaplain note.        Session Time: 12:00 -1:00  Participation Level: Active  Behavioral Response: CasualAlertDepressed  Type of Therapy: Group Therapy, Psychoeducation  Treatment Goals addressed: Coping  Interventions: relaxation training; Supportive; Reframing  Summary: 12:00 - 12:50: Relaxation group: Cln led group focused on retraining the body's response to stress.   12:50 -1:00 Clinician led check-out. Clinician assessed for immediate needs, medication compliance and efficacy, and safety concerns   Therapist Response: Patient engaged in activity and discussion. 12:50 - 1:00: At check-out, patient rates her mood at a 5 on a scale of 1-10 with 10 being great.Pt states  afternoon plans of trying to clean her  room again and relaxing. Patient demonstrates someprogress as evidenced by increased insight.Patient denies SI/HI/self-harm at the end of group.     Suicidal/Homicidal: Nowithout intent/plan  Plan: Pt will continue in PHP while working to decrease depression and anxiety symptoms and increase ability to manage symptoms in a healthy manner as they arise.   Diagnosis: Bipolar 1 disorder, depressed ([place]) [F31.9]    1. Bipolar 1 disorder, depressed ([place])       [name], LCSW 09/29/2019

## 2019-09-29 NOTE — Psych (Signed)
Virtual Visit via Video Note  I connected with Chelsea Hoover on 09/24/19 at  9:00 AM EST by a video enabled telemedicine application and verified that I am speaking with the correct person using two identifiers.   I discussed the limitations of evaluation and management by telemedicine and the availability of in person appointments. The patient expressed understanding and agreed to proceed.  I discussed the assessment and treatment plan with the patient. The patient was provided an opportunity to ask questions and all were answered. The patient agreed with the plan and demonstrated an understanding of the instructions.   The patient was advised to call back or seek an in-person evaluation if the symptoms worsen or if the condition fails to improve as anticipated.  Pt was provided 240 minutes of non-face-to-face time during this encounter.   [name], LCSW    [facility] PHP THERAPIST PROGRESS NOTE  Chelsea Hoover [id number]  Session Time: 9:00 - 10:00  Participation Level: Active  Behavioral Response: CasualAlertDepressed  Type of Therapy: Group Therapy  Treatment Goals addressed: Coping  Interventions: CBT, DBT, Supportive and Reframing  Summary: Clinician led check-in regarding current stressors and situation, and review of patient completed daily inventory. Clinician utilized active listening and empathetic response and validated patient emotions. Clinician facilitated processing group on pertinent issues.   Therapist Response:Chelsea Hoover is a 24 y.o. female who presents with depression and anxiety symptoms. Patient arrived within time allowed and reports that she is feeling "not great." Patient rates hermood at a 2on a scale of 1-10 with 10 being great. Pt reports she spent most of the day ruminating about cognitive distortions. Pt reports experiencing self-harm thoughts which she managed by reaching out to her boyfriend. Pt states this helped, however later she felt  "crappy" and dependent for reaching out and drank alone to cope. Pt reports disappointment for breaking her personal goal of only drinking socially. Pt struggles to be vulnerable. Pt is able to process. Patient engaged in discussion.      Session Time: 10:00-11:00  Participation Level:Active  Behavioral Response:CasualAlertDepressed  Type of Therapy: Group Therapy, psychoeducation, psychotherapy  Treatment Goals addressed: Coping  Interventions:CBT, DBT, Solution Focused, Supportive and Reframing  Summary:Cln led discussion on talking to other people about our mental health struggles. Group members expressed concerns and frustrations they have with sharing their mental health symptoms. Cln helped pt's brainstorm ways to increase comfort. Cln offered support groups as an alternative to discussing mental health symptoms with people in our lives who they feel are unsupportive of their mental health.   Therapist Response: Pt participted in discussion and shares that she feels she is "too crazy" for her boyfriend and that her family is often dismissive of her symptoms, however it has improved since her hospitalization. Pt reports willingness to try disclosing different layers of her experience rather than everything and is open to support groups.       Session Time: 11:00- 12:00  Participation Level:Active  Behavioral Response:CasualAlertDepressed  Type of Therapy: Group Therapy, psychoeducation, psychotherapy  Treatment Goals addressed: Coping  Interventions:CBT, DBT, Solution Focused, Supportive and Reframing  Summary:Cln led continued topic of cognitive distortions. Cln reviewed previous discussed aspects of the topic and pt "catches" they had observed at home. Cln utilized handout "Unhealthy Thought Patterns" to discuss further ways distorted thinking can present. Group members shared ways in which they are a problem for them.    Therapist  Response: Pt engaged in discussion reports understanding of topic discussed. Pt shares examples  of mind reading and personalizing she experienced last night. Pt identifies false permanence as a distorted pattern she struggles with most.       Session Time: 12:00 -1:00  Participation Level:Active  Behavioral Response:CasualAlertDepressed  Type of Therapy: Group Therapy, Psychoeducation; Psychotherapy  Treatment Goals addressed: Coping  Interventions:CBT; Solution focused; Supportive; Reframing  Summary:12:00 - 12:50:Cln continued topic of cognitive distortions and introduced "challenge" step of behavior modification. Cln used CBT thought challenging tenets. Cln utilized Web designer Thinking" to aid in thought battling "caught" distorted thoughts. Group members practiced the process with generic examples.    Therapist Response:12:00 - 12:50:Pt engaged in discussion and reports understanding of challenging.  12:50 - 1:00: At check-out, patient rates her mood at a 5 on a scale of 1-10 with 10 being great.Pt states afternoon plans of having lunch with her adopted grandfather and trying again to do a [employer] shift. Patient demonstrates  someprogress as evidenced by successfully managing self-harm thoughts.Patient denies SI/HI/self-harm at the end of group.     Suicidal/Homicidal: Nowithout intent/plan  Plan: Pt will continue in PHP while working to decrease depression and anxiety symptoms and increase ability to manage symptoms in a healthy manner as they arise.   Diagnosis: Bipolar 1 disorder, depressed ([place]) [F31.9]    1. Bipolar 1 disorder, depressed ([place])       [name], LCSW 09/29/2019

## 2019-09-29 NOTE — Psych (Signed)
Virtual Visit via Video Note  I connected with Chelsea Hoover on 09/23/19 at  9:00 AM EST by a video enabled telemedicine application and verified that I am speaking with the correct person using two identifiers.   I discussed the limitations of evaluation and management by telemedicine and the availability of in person appointments. The patient expressed understanding and agreed to proceed.  I discussed the assessment and treatment plan with the patient. The patient was provided an opportunity to ask questions and all were answered. The patient agreed with the plan and demonstrated an understanding of the instructions.   The patient was advised to call back or seek an in-person evaluation if the symptoms worsen or if the condition fails to improve as anticipated.  Pt was provided 240 minutes of non-face-to-face time during this encounter.   [name], LCSW    [facility] PHP THERAPIST PROGRESS NOTE  Chelsea Hoover [id number]  Session Time: 9:00 - 10:00  Participation Level: Active  Behavioral Response: CasualAlertDepressed  Type of Therapy: Group Therapy  Treatment Goals addressed: Coping  Interventions: CBT, DBT, Supportive and Reframing  Summary: Clinician led check-in regarding current stressors and situation, and review of patient completed daily inventory. Clinician utilized active listening and empathetic response and validated patient emotions. Clinician facilitated processing group on pertinent issues.   Therapist Response:Chelsea Hoover is a 24 y.o. female who presents with depression and anxiety symptoms. Patient arrived within time allowed and reports that she is feeling "encouraged." Patient rates hermood at a 6.5on a scale of 1-10 with 10 being great. Pt reports she got a lead on a new job opportunity yesterday and is feeling positive about it. Pt states her afternoon went well and she made progress on organizing her bedroom and had an uneventful dinner with her  family which is a success. Pt reports she drank alcohol the past two nights and feels comfortable with the moderation she has practiced.  Pt is able to process. Patient engaged in discussion.      Session Time: 10:00-11:00  Participation Level:Active  Behavioral Response:CasualAlertDepressed  Type of Therapy: Group Therapy, psychoeducation, psychotherapy  Treatment Goals addressed: Coping  Interventions:CBT, DBT, Solution Focused, Supportive and Reframing  Summary:Cln led discussion on forgiveness. Group members discussed struggles they are experiencing with forgiveness including not feeling able to forgive and waiting for apologies. Cln explored the expectations pt's have about forgiveness and challenged unrealistic expectations. Cln reviewed radical acceptance as an alternative to seeking forgiveness.   Therapist Response: Pt engaged in discussion and shared forgiveness towards her parents has been a struggle for her. Pt states waiting for an apology from her parents and feeling she cannot move on until she has received one. Pt is able to challenge this thought and determine other options.        Session Time: 11:00- 12:00  Participation Level:Active  Behavioral Response:CasualAlertDepressed  Type of Therapy: Group Therapy, Psychoeducation; Psychotherapy  Treatment Goals addressed: Coping  Interventions:CBT; Solution focused; Supportive; Reframing  Summary:Cln continued topic of cognitive distortions. Cln utilized handout "Cognitive Distortions" to review examples of common distorted thoughts to increase awareness and the ability to "catch" the negative behavior. Group members worked to identify examples of distortions in their own lives.   Therapist Response: Pt engaged in discussion and reports understanding of distortions discussed. Pt reports struggling most with personalization, emotional reasoning, and mind reading.         Session Time: 12:00 -1:00  Participation Level:Active  Behavioral Response:CasualAlertDepressed  Type of Therapy: Group  Therapy, OT  Treatment Goals addressed: Coping  Interventions:Psychosocial skills training, Supportive,   Summary:12:00 - 12:50:Occupational Therapy group 12:50 -1:00 Clinician led check-out. Clinician assessed for immediate needs, medication compliance and efficacy, and safety concerns   Therapist Response:12:00 - 12:50:Patient engaged in group. See OT note.  12:50 - 1:00: At check-out, patient rates her mood at a 5 on a scale of 1-10 with 10 being great.Pt states afternoon plans of spending time with her boyfriend, doing a [employer] shift, and doing self-care. Patient demonstrates  someprogress as evidenced by improved mood and hopefulness.Patient denies SI/HI/self-harm at the end of group.     Suicidal/Homicidal: Nowithout intent/plan  Plan: Pt will continue in PHP while working to decrease depression and anxiety symptoms and increase ability to manage symptoms in a healthy manner as they arise.   Diagnosis: Bipolar 1 disorder, depressed (HCC) [F31.9]    1. Bipolar 1 disorder, depressed (HCC)       [name], LCSW 09/29/2019

## 2019-09-30 ENCOUNTER — Other Ambulatory Visit: Payer: Self-pay

## 2019-09-30 ENCOUNTER — Other Ambulatory Visit (HOSPITAL_COMMUNITY): Payer: BC Managed Care – PPO | Admitting: Licensed Clinical Social Worker

## 2019-09-30 ENCOUNTER — Other Ambulatory Visit (HOSPITAL_COMMUNITY): Payer: BC Managed Care – PPO

## 2019-09-30 DIAGNOSIS — F319 Bipolar disorder, unspecified: Secondary | ICD-10-CM
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
## 2019-09-30 NOTE — Psych (Signed)
Virtual Visit via Video Note  I connected with [name] on 09/28/19 at  9:00 AM EST by a video enabled telemedicine application and verified that I am speaking with the correct person using two identifiers.   I discussed the limitations of evaluation and management by telemedicine and the availability of in person appointments. The patient expressed understanding and agreed to proceed.  I discussed the assessment and treatment plan with the patient. The patient was provided an opportunity to ask questions and all were answered. The patient agreed with the plan and demonstrated an understanding of the instructions.   The patient was advised to call back or seek an in-person evaluation if the symptoms worsen or if the condition fails to improve as anticipated.  Pt was provided 240 minutes of non-face-to-face time during this encounter.   [name], LCSW    [facility] PHP THERAPIST PROGRESS NOTE  Auda Finfrock [id number]  Session Time: 9:00 - 10:00  Participation Level: Active  Behavioral Response: CasualAlertDepressed  Type of Therapy: Group Therapy  Treatment Goals addressed: Coping  Interventions: CBT, DBT, Supportive and Reframing  Summary: Clinician led check-in regarding current stressors and situation, and review of patient completed daily inventory. Clinician utilized active listening and empathetic response and validated patient emotions. Clinician facilitated processing group on pertinent issues.   Therapist Response:Vashon Stroschein is a 24 y.o. female who presents with depression and anxiety symptoms. Patient arrived within time allowed and reports that she is feeling "pretty okay." Patient rates hermood at a 6on a scale of 1-10 with 10 being great. Pt reports her afternoon went well and she spent time with her sisters and saw her boyfriend. Pt states later in the evening she felt "weird" and she thinks she was "depersonalizing." Pt describes feeling numb and  unattached. Pt is able to process. Patient engaged in discussion.      Session Time: 10:00-11:00  Participation Level:Active  Behavioral Response:CasualAlertDepressed  Type of Therapy: Group Therapy, psychoeducation, psychotherapy  Treatment Goals addressed: Coping  Interventions:CBT, DBT, Solution Focused, Supportive and Reframing  Summary:Cln led discussion on utilizing distraction as an emotional management tool. Cln used DBT distress tolerance ACCEPTS skills to inform discussion. Cln worked with pt's to overcome barriers  to utilizing distraction activities to manage mood despite feeling like it is "nothing" or "lazy."  Therapist Response: Pt engaged in discussion and connects her "depersonalization" symptoms last night to a situation in which distraction could have helped. Pt reports she does struggle with feeling like she is being lazy when she uses distraction activities and will work to do it anyway.        Session Time: 11:00- 12:00  Participation Level:Active  Behavioral Response:CasualAlertDepressed  Type of Therapy: Group Therapy, Psychoeducation; Psychotherapy  Treatment Goals addressed: Coping  Interventions:CBT; Solution focused; Supportive; Reframing  Summary:Cln introduced topic of boundaries. Cln discussed what boundaries are and how they can improve our mental health when there are healthy boundaries. Cln introduced different characteristics of boundaries: rigid, porous, and healthy and group discussed which types of boundaries they have when.   Therapist Response: Pt engaged in discussion and reports understanding of boundary characteristics. Pt states she has predominately porous and rigid boundaries.       Session Time: 12:00 -1:00  Participation Level:Active  Behavioral Response:CasualAlertDepressed  Type of Therapy: Group Therapy, OT  Treatment Goals addressed: Coping  Interventions:Psychosocial  skills training, Supportive,   Summary:12:00 - 12:50:Occupational Therapy group 12:50 -1:00 Clinician led check-out. Clinician assessed for immediate needs, medication compliance  and efficacy, and safety concerns   Therapist Response:12:00 - 12:50:Patient engaged in group. See OT note.  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 Patient demonstrates  someprogress as evidenced by increased ability to recognize problematic behaviors in hindsight.Patient denies SI/HI/self-harm at the end of group.     Suicidal/Homicidal: Nowithout intent/plan  Plan: Pt will continue in PHP while working to decrease depression and anxiety symptoms and increase ability to manage symptoms in a healthy manner as they arise.   Diagnosis: Bipolar 1 disorder, depressed ([place]) [F31.9]    1. Bipolar 1 disorder, depressed ([place])       [name], LCSW 09/30/2019

## 2019-09-30 NOTE — Psych (Signed)
Virtual Visit via Video Note  I connected with [name] on 09/30/19 at  9:00 AM EST by a video enabled telemedicine application and verified that I am speaking with the correct person using two identifiers.   I discussed the limitations of evaluation and management by telemedicine and the availability of in person appointments. The patient expressed understanding and agreed to proceed.  I discussed the assessment and treatment plan with the patient. The patient was provided an opportunity to ask questions and all were answered. The patient agreed with the plan and demonstrated an understanding of the instructions.   The patient was advised to call back or seek an in-person evaluation if the symptoms worsen or if the condition fails to improve as anticipated.  Pt was provided 240 minutes of non-face-to-face time during this encounter.   [name], LCSW    [facility] PHP THERAPIST PROGRESS NOTE  Shanasia Ibrahim [id number]  Session Time: 9:00 - 10:00  Participation Level: Active  Behavioral Response: CasualAlertDepressed  Type of Therapy: Individual Therapy  Treatment Goals addressed: Coping  Interventions: CBT, DBT, Supportive and Reframing  Summary: Clinician led check-in regarding current stressors and situation, and review of patient completed daily inventory. Clinician utilized active listening and empathetic response and validated patient emotions. Clinician facilitated processing group on pertinent issues.   Therapist Response:Amia Hsia is a 24 y.o. female who presents with depression and anxiety symptoms. Patient arrived within time allowed and reports that she is feeling "solidly neutral." Patient rates hermood at a 5.5on a scale of 1-10 with 10 being great. Pt reports she felt ill yesterday however is feeling better today. Pt states she was "emotionally down" for a chunk of the day, however was able to rebound after using essential oils. Pt states experiencing  some passive SI during this time period and feeling like she had a firm control in managing it.  Pt is able to process. Patient engaged in discussion.      Session Time: 10:00-11:00  Participation Level:Active  Behavioral Response:CasualAlertDepressed  Type of Therapy: Individual Therapy  Treatment Goals addressed: Coping  Interventions:CBT, DBT, Solution Focused, Supportive and Reframing  Summary:Cln continued topic of bounaries. Cln led activity using tv show examples to identify aspects of rigid, porous, and healthy boundaries. Pt worked to recognize which motivations and actions aligned with the different characteristics of boundaries and when they were use in a healpful or hurtful way.  Therapist Response:  Pt engaged in discussion and activity. Pt exhibited strong understanding of the boundary charactersistics as evidenced by being able to point out and explain observed behaviors in terms of boundaries and also relate them to herself.       Session Time: 11:00- 12:00  Participation Level:Active  Behavioral Response:CasualAlertDepressed  Type of Therapy: Individual Therapy  Treatment Goals addressed: Coping  Interventions:CBT; Solution focused; Supportive; Reframing  Summary:Cln continued topic of boundaries and introduced different types of boundaries: physical, intellectual, emotional, sexual, material, and time. Cln and pt discussed how each type presents and how pt can relate to each type and how to improve them.    Therapist Response:  Pt engaged in discussion and identifies her emotional and time boundaries need the most work. Pt is able to point out a personal example of a time she had healthy and unhealthy boundaries for each type as well as how she can strengthen boundaries from her unhealthy example.        Session Time: 12:00 -1:00  Participation Level:Active  Behavioral Response:CasualAlertDepressed  Type  of  Therapy: Group Therapy, OT  Treatment Goals addressed: Coping  Interventions:Psychosocial skills training, Supportive,   Summary:12:00 - 12:50:Occupational Therapy group 12:50 -1:00 Clinician led check-out. Clinician assessed for immediate needs, medication compliance and efficacy, and safety concerns   Therapist Response:12:00 - 12:50:Patient engaged in group. See OT note.  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 Patient demonstrates  someprogress as evidenced by increased ability to thought challenge in the moment.Patient denies SI/HI/self-harm at the end of group.     Suicidal/Homicidal: Nowithout intent/plan  Plan: Pt will continue in PHP while working to decrease depression and anxiety symptoms and increase ability to manage symptoms in a healthy manner as they arise.   Diagnosis: Bipolar 1 disorder, depressed (HCC) [F31.9]    1. Bipolar 1 disorder, depressed (HCC)       [name], LCSW 09/30/2019

## 2019-10-01 ENCOUNTER — Other Ambulatory Visit (HOSPITAL_COMMUNITY): Payer: BC Managed Care – PPO

## 2019-10-01 ENCOUNTER — Other Ambulatory Visit: Payer: Self-pay

## 2019-10-01 ENCOUNTER — Other Ambulatory Visit (HOSPITAL_COMMUNITY): Payer: BC Managed Care – PPO | Admitting: Licensed Clinical Social Worker

## 2019-10-01 ENCOUNTER — Encounter (HOSPITAL_COMMUNITY): Payer: Self-pay | Admitting: Family

## 2019-10-01 DIAGNOSIS — F319 Bipolar disorder, unspecified: Secondary | ICD-10-CM

## 2019-10-01 NOTE — Psych (Signed)
Virtual Visit via Video Note  I connected with Chelsea Hoover on 10/01/19 at  9:00 AM EST by a video enabled telemedicine application and verified that I am speaking with the correct person using two identifiers.   I discussed the limitations of evaluation and management by telemedicine and the availability of in person appointments. The patient expressed understanding and agreed to proceed.  I discussed the assessment and treatment plan with the patient. The patient was provided an opportunity to ask questions and all were answered. The patient agreed with the plan and demonstrated an understanding of the instructions.   The patient was advised to call back or seek an in-person evaluation if the symptoms worsen or if the condition fails to improve as anticipated.  Pt was provided 240 minutes of non-face-to-face time during this encounter.   [name], LCSW    [facility] PHP THERAPIST PROGRESS NOTE  Cailah Reach [id number]  Session Time: 9:00 - 10:00  Participation Level: Active  Behavioral Response: CasualAlertDepressed  Type of Therapy: Group Therapy  Treatment Goals addressed: Coping  Interventions: CBT, DBT, Supportive and Reframing  Summary: Clinician led check-in regarding current stressors and situation, and review of patient completed daily inventory. Clinician utilized active listening and empathetic response and validated patient emotions. Clinician facilitated processing group on pertinent issues.   Therapist Response:Chelsea Hoover is a 24 y.o. female who presents with depression and anxiety symptoms. Patient arrived within time allowed and reports that she is feeling"ok, but tired." Patient rates hermood at a 5on a scale of 1-10 with 10 being great. Pt reports yesterday went "pretty well" however she continues to go to bed late. Pt states she washed her bedding for self-care, visited with a friend, and saw her boyfriend. Pt shares that in the evening she got  lightheaded and felt "heavy" for 2-3 hours. Pt states she is unsure of any trigger and is considering going to get blood work done to make sure nothing physically is wrong. Pt struggles with completing her planned [employer] shifts. Pt is able to process. Patient engaged in discussion.        Session Time: 10:00-11:00  Participation Level:Active  Behavioral Response:CasualAlertDepressed  Type of Therapy: Group Therapy, psychoeducation, psychotherapy  Treatment Goals addressed: Coping  Interventions:CBT, DBT, Solution Focused, Supportive and Reframing  Summary:Cln introduced topic of core beliefs. Cln utilized CBT to discuss how core beliefs are created, affect our thoughts and perceptions, and can be challenged. Group members identified some of their negative ore beliefs, how they affect them, and how to challenge them.   Therapist Response: Pt participted in discussion and identifies negative core belief of "I am worthless." Pt reports affects of low self-esteem, insecurities, and questioning her relationships. Pt is able to appropriately challenge the negative core belief and process.      Session Time: 11:00- 12:00  Participation Level:Active  Behavioral Response:CasualAlertDepressed  Type of Therapy: Group Therapy, psychoeducation, psychotherapy  Treatment Goals addressed: Coping  Interventions:CBT, DBT, Solution Focused, Supportive and Reframing  Summary:Cln continued topic of boundaries. Cln introduced how to set a boundary incorporating, assertiveness, positive communication, self-respect, and healthy relationships. Group used generic examples to practice process of setting and reinforcing a healthy boundary.     Therapist Response: Pt engaged in discussion and demonstrates understanding of topic by working through examples with the group.       Session Time: 12:00 -1:00  Participation Level:Active  Behavioral  Response:CasualAlertDepressed  Type of Therapy: Group Therapy, Psychoeducation; Psychotherapy  Treatment Goals addressed:  Coping  Interventions:CBT; Solution focused; Supportive; Reframing  Summary:12:00 - 12:50:Cln continued topic of boundaries and led boundary workshop in which group members brought a personal boundary issue and group worked together to identify and address the issue at hand. Cln addressed common boundary pitfalls and how to handle them. 12:50 -1:00 Clinician led check-out. Clinician assessed for immediate needs, medication compliance and efficacy, and safety concerns  Therapist Response:12:00 - 12:50:Pt engaged in discussion and activity. Pt identifies boundary issue of trying to manage her boyfriends moods. Pt is able to process through issue using boundary tenets and accepted feedback from group and cln.   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 Patient demonstrates someprogress as evidenced by having a vulnerable discussion with her boyfriend.Patient denies SI/HI/self-harm at the end of group.     Suicidal/Homicidal: Nowithout intent/plan  Plan: Pt will continue in PHP while working to decrease depression and anxiety symptoms and increase ability to manage symptoms in a healthy manner as they arise.   Diagnosis: Bipolar 1 disorder, depressed (HCC) [F31.9]    1. Bipolar 1 disorder, depressed (HCC)       Chelsea Guiles, LCSW 10/01/2019

## 2019-10-01 NOTE — Progress Notes (Signed)
Virtual Visit via Video Note  I connected with Chelsea Hoover on 10/01/19 at  9:00 AM EST by a video enabled telemedicine application and verified that I am speaking with the correct person using two identifiers.   I discussed the limitations of evaluation and management by telemedicine and the availability of in person appointments. The patient expressed understanding and agreed to proceed.    I discussed the assessment and treatment plan with the patient. The patient was provided an opportunity to ask questions and all were answered. The patient agreed with the plan and demonstrated an understanding of the instructions.   The patient was advised to call back or seek an in-person evaluation if the symptoms worsen or if the condition fails to improve as anticipated.  I provided 15  minutes of non-face-to-face time during this encounter.   [name], NP    [facility] MD/PA/NP OP Progress Note  10/01/2019 10:23 AM Chelsea Hoover  MRN:  [id number]  Evaluation: Chelsea Hoover.  She presents with a brighter affect during this assessment.  Rating her depression 7 out of 10 with 10 being the worst.  She is currently denying suicidal or homicidal ideations.  Denies auditory or visual hallucinations.  Patient does report  "lightheaded this and tiredness" which she reports started about 2 weeks prior.  States she is unsure if it is attributed to the Abilify which she was initiated on inpatient.  Chelsea Hoover reported a history of thyroid disorders throughout her family.  States she has a follow-up appointment with her primary care provider for lab testing on this afternoon or Monday.  Discussed titrating Abilify to 5 mg however patient declined at this time.  Patient also reported that she recently stopped using marijuana.  States she has been " sober" for the past week and a half as this relates to her marijuana intake.  We will continue to monitor for safety.  Support,  encouragement and reassurance was provided.   Visit Diagnosis: No diagnosis found.  Past Psychiatric History:   Past Medical History:  Past Medical History:  Diagnosis Date  . Anxiety   . 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 Bipolar 1 disorder (HCC)   . Cannabis abuse 09/03/2015  . Depression   . Irritable bowel syndrome   . Lyme disease   . Mood disorder (HCC)   . PTSD (post-traumatic stress disorder) 09/01/2015    Past Surgical History:  Procedure Laterality Date  . TONSILECTOMY/ADENOIDECTOMY WITH MYRINGOTOMY Bilateral    August 2020    Family Psychiatric History:   Family History:  Family History  Problem Relation Age of Onset  . Crohn's disease Father   . Bipolar disorder Maternal Aunt   . Schizophrenia Maternal Grandfather   . Bipolar disorder Maternal Grandfather     Social History:  Social History   Socioeconomic History  . Marital status: Single    Spouse name: Not on file  . Number of children: Not on file  . Years of education: Not on file  . Highest education level: Not on file  Occupational History  . Not on file  Tobacco Use  . Smoking status: Never Smoker  . Smokeless tobacco: Never Used  Substance and Sexual Activity  . Alcohol use: No  . Drug use: Yes    Types: Marijuana    Comment: Few times a week  . Sexual activity: Yes    Birth control/protection: Pill  Other Topics Concern  . Not on file  Social  History Narrative  . Not on file   Social Determinants of Health   Financial Resource Strain:   . Difficulty of Paying Living Expenses: Not on file  Food Insecurity:   . Worried About Charity fundraiser in the Last Year: Not on file  . Ran Out of Food in the Last Year: Not on file  Transportation Needs:   . Lack of Transportation (Medical): Not on file  . Lack of Transportation (Non-Medical): Not on file  Physical Activity:   . Days of Exercise per Week: Not on file  . Minutes of Exercise per Session: Not on file  Stress:    . Feeling of Stress : Not on file  Social Connections:   . Frequency of Communication with Friends and Family: Not on file  . Frequency of Social Gatherings with Friends and Family: Not on file  . Attends Religious Services: Not on file  . Active Member of Clubs or Organizations: Not on file  . Attends Archivist Meetings: Not on file  . Marital Status: Not on file    Allergies:  Allergies  Allergen Reactions  . Lactose Intolerance (Gi) Other (See Comments)    Pain in stomach  . Oxycodone Other (See Comments)    Intense vomiting  . Latex Rash    Metabolic Disorder Labs: Lab Results  Component Value Date   HGBA1C 5.3 09/07/2019   MPG 105 09/07/2019   MPG 120 09/01/2015   No results found for: PROLACTIN Lab Results  Component Value Date   CHOL 166 09/07/2019   TRIG 72 09/07/2019   HDL 63 09/07/2019   CHOLHDL 2.6 09/07/2019   VLDL 14 09/07/2019   LDLCALC 89 09/07/2019   LDLCALC 62 09/01/2015   Lab Results  Component Value Date   TSH 3.841 09/07/2019   TSH 1.707 09/01/2015    Therapeutic Level Labs: No results found for: LITHIUM No results found for: VALPROATE No components found for:  CBMZ  Current Medications: Current Outpatient Medications  Medication Sig Dispense Refill  . ARIPiprazole (ABILIFY) 10 MG tablet Take 1 tablet (10 mg total) by mouth daily. 30 tablet 0  . buPROPion (WELLBUTRIN XL) 150 MG 24 hr tablet Take 1 tablet (150 mg total) by mouth daily. 30 tablet 0  . Colloidal Oatmeal (AVEENO ECZEMA THERAPY) 1 % CREA Apply 1 application topically as needed (For eczema.).    Marland Kitchen famotidine (PEPCID) 20 MG tablet Take 1 tablet (20 mg total) by mouth 2 (two) times daily. 60 tablet 0  . hydrOXYzine (VISTARIL) 25 MG capsule Take 25 mg by mouth 3 (three) times daily as needed.    . lamoTRIgine (LAMICTAL) 150 MG tablet Take 1 tablet (150 mg total) by mouth 2 (two) times daily. 60 tablet 0  . Multiple Vitamin (MULTIVITAMIN WITH MINERALS) TABS tablet Take  1 tablet by mouth daily.    . pantoprazole (PROTONIX) 40 MG tablet Take 1 tablet (40 mg total) by mouth daily. 30 tablet 0  . traZODone (DESYREL) 50 MG tablet Take 1.5 tablets (75 mg total) by mouth at bedtime as needed for sleep. 60 tablet 0   No current facility-administered medications for this visit.     Musculoskeletal:  Psychiatric Specialty Exam: Review of Systems  Last menstrual period 09/07/2019.There is no height or weight on file to calculate BMI.  General Appearance: Casual  Eye Contact:  Fair  Speech:  Clear and Coherent  Volume:  Normal  Mood:  Anxious and Depressed  Affect:  Congruent  Thought Process:  Coherent  Orientation:  Full (Time, Place, and Person)  Thought Content: WDL   Suicidal Thoughts:  No  Homicidal Thoughts:  No  Memory:  Immediate;   Fair Recent;   Fair  Judgement:  Fair  Insight:  Fair  Psychomotor Activity:  Normal  Concentration:  Concentration: Fair  Recall:  [employer] of Knowledge: Fair  Language: Good  Akathisia:  No  Handed:  Right  AIMS (if indicated):   Assets:  Communication Skills Desire for Improvement Resilience Social Support  ADL's:  Intact  Cognition: WNL  Sleep:  Fair   Screenings: AIMS     [facility]  AIMS Total Score  0  0    AUDIT     Admission (Discharged) [facility]  Alcohol Use Disorder Identification Test Final Score (AUDIT)  11    PHQ2-9     Counselor from [facility]17[facility]  PHQ-2 Total Score  4  PHQ-9 Total Score  18       Assessment and Plan:  Continue partial hospitalization programming Continue medications as directed Keep follow-up appointment with primary care provider for TSH levels  Treatment plan was  reviewed and agreed upon by NP T. Cheyenne Schumm inpatient [employer] need for continued group services   [name], NP 10/01/2019, 10:23 AM

## 2019-10-04 ENCOUNTER — Other Ambulatory Visit (HOSPITAL_COMMUNITY): Payer: BC Managed Care – PPO | Attending: Psychiatry | Admitting: Licensed Clinical Social Worker

## 2019-10-04 ENCOUNTER — Other Ambulatory Visit: Payer: Self-pay

## 2019-10-04 DIAGNOSIS — F419 Anxiety disorder, unspecified: Secondary | ICD-10-CM | POA: Diagnosis not present

## 2019-10-04 DIAGNOSIS — Z915 Personal history of self-harm: Secondary | ICD-10-CM | POA: Diagnosis not present

## 2019-10-04 DIAGNOSIS — F319 Bipolar disorder, unspecified: Secondary | ICD-10-CM | POA: Diagnosis not present

## 2019-10-05 ENCOUNTER — Other Ambulatory Visit: Payer: Self-pay

## 2019-10-05 ENCOUNTER — Other Ambulatory Visit (HOSPITAL_COMMUNITY): Payer: BC Managed Care – PPO

## 2019-10-05 ENCOUNTER — Encounter (HOSPITAL_COMMUNITY): Payer: Self-pay | Admitting: Family

## 2019-10-05 ENCOUNTER — Other Ambulatory Visit (HOSPITAL_COMMUNITY): Payer: BC Managed Care – PPO | Admitting: Family

## 2019-10-05 DIAGNOSIS — F319 Bipolar disorder, unspecified: Secondary | ICD-10-CM

## 2019-10-05 MED ORDER — PANTOPRAZOLE SODIUM 40 MG PO TBEC: 40.0000 mg | DELAYED_RELEASE_TABLET | Freq: Every day | ORAL | 0 refills | Status: DC

## 2019-10-05 NOTE — Psych (Signed)
Virtual Visit via Video Note  I connected with Chelsea Hoover on 10/04/19 at  9:00 AM EST by a video enabled telemedicine application and verified that I am speaking with the correct person using two identifiers.   I discussed the limitations of evaluation and management by telemedicine and the availability of in person appointments. The patient expressed understanding and agreed to proceed.  I discussed the assessment and treatment plan with the patient. The patient was provided an opportunity to ask questions and all were answered. The patient agreed with the plan and demonstrated an understanding of the instructions.   The patient was advised to call back or seek an in-person evaluation if the symptoms worsen or if the condition fails to improve as anticipated.  Pt was provided 240 minutes of non-face-to-face time during this encounter.   [name], LCSW    [facility] PHP THERAPIST PROGRESS NOTE  Chelsea Hoover [id number]  Session Time: 9:00 - 10:00  Participation Level: Active  Behavioral Response: CasualAlertDepressed  Type of Therapy: Individual Therapy  Treatment Goals addressed: Coping  Interventions: CBT, DBT, Supportive and Reframing  Summary: Clinician led check-in regarding current stressors and situation, and review of patient completed daily inventory. Clinician utilized active listening and empathetic response and validated patient emotions. Clinician facilitated processing group on pertinent issues.   Therapist Response:Chelsea Hoover is a 24 y.o. female who presents with depression and anxiety symptoms. Patient arrived within time allowed and reports that she is feeling "chipper." Patient rates hermood at a 7on a scale of 1-10 with 10 being great. Pt reports she continues to be tired and thinks it is affecting her more as it builds up. Pt reports her weekend went "pretty good" however Sunday night and this morning were more difficult. Pt states there was  tension at her boyfriend's house Sunday night and she got into a fight with her dad this morning. Pt states feeling "light headed" again on Friday night and this continues to be a concern fr her as she does not know what makes it happen. Pt again states plans to see her PCP about this.  Pt reports struggles with motivation. Pt is able to process. Patient engaged in discussion.      Session Time: 10:00-11:00  Participation Level:Active  Behavioral Response:CasualAlertDepressed  Type of Therapy: Individual Therapy  Treatment Goals addressed: Coping  Interventions:CBT, DBT, Solution Focused, Supportive and Reframing  Summary:Cln provided space for pt to process a conflict-heavy situation from the night before. Cln validated, normalized, and infused grace into the discussion. Cln brought in assertiveness, thought challenging, and boundaries as different lenses and options to problem solve.   Therapist Response: Pt shares she had a "really uncomfortable" situation happen at her boyfriend's house. Pt states boyfriend and boyfriend's brother were in a conflict and pt felt unsure of her role in the situation. Pt states their interactions reminded her of when her dad is angry which was also slightly triggering. Pt reports struggling to manage her feelings and noticed an increase in anxiety. Pt is able to process reasons for her discomfort and how to address it.       Session Time: 11:00- 12:00  Participation Level:Active  Behavioral Response:CasualAlertDepressed  Type of Therapy: Individual Therapy  Treatment Goals addressed: Coping  Interventions:CBT, DBT, Solution Focused, Supportive and Reframing  Summary:Cln introduced topic of vulnerability. Cln showed TED talk "The power of vulnerability" to aid discussion. Cln led discussion on ways in which vulnerability is a struggle for pt and what particular areas  are most vulnerable. Cln and pt discussed shame,  worthiness, and ways pt numbs unwanted feelings. Cln shared ways to increase comfort with vulnerability and pt identified ways she may be willing to practice.      Therapist Response: Pt reports vulnerability is "something I am not good at." Pt brought up previously discussed insights including her fear of rejection and negative core belief that she is unworthy and how they compound her struggle with vulnerability. Pt identifies numbing with alcohol, marijuana, isolating behaviors, and shutting down. Pt reports she feels vulnerability is more of an issue now that she is trying to cut back on smoking pot and drinking alcohol. Pt able to process.      Session Time: 12:00 -1:00  Participation Level:Active  Behavioral Response:CasualAlertDepressed  Type of Therapy: Individual Therapy  Treatment Goals addressed: Coping  Interventions:CBT; Solution focused; Supportive; Reframing  Summary:12:00 - 12:50:Cln introduced topic of gratitude. Cln validated pt's conflictual relationship with gratitude and worked with pt to reframe. Cln discussed gratitude from a positive psychology and CBT perspective and shared how gratitudes can be used to retrain our brain to scan for the positive instead of the negative. Cln introduced a gratitude challenge and encouraged pt to try it out.  12:50 -1:00 Clinician led check-out. Clinician assessed for immediate needs, medication compliance and efficacy, and safety concerns   Therapist Response:12:00 - 12:50:Pt shares that gratitudes were a frequent topic in the religion in which she was raised and has since parted from. Pt reports she doesn't want to throw out everything she was raised on, however the negative connections she has to the religion right now makes it difficult. Pt able to process and reports comfort with talking about gratitude in the scope of therapy. Pt reports understanding of how gratitudes can be utilized to "retrain" her  perceptions. Pt identifies three things she is grateful for today as coffee, her boyfriend, and her cat. Pt reports she is unsure whether she can keep up with daily gratitudes however is open to trying.  12:50 - 1:00: At check-out, patient rates her mood at a 6 on a scale of 1-10 with 10 being great.Pt states afternoon plans of showering, taking a nap, and continuing to organize her room. Patient demonstrates  someprogress as evidenced by decreased passive SI. Patient denies SI/HI/self-harm at the end of group.     Suicidal/Homicidal: Nowithout intent/plan  Plan: Pt will continue in PHP while working to decrease depression and anxiety symptoms and increase ability to manage symptoms in a healthy manner as they arise.   Diagnosis: Bipolar 1 disorder, depressed (HCC) [F31.9]    1. Bipolar 1 disorder, depressed (HCC)       [name], LCSW 10/05/2019

## 2019-10-05 NOTE — Progress Notes (Addendum)
  Virtual Visit via Telephone Note  I connected with Chelsea Hoover on 10/05/19 at  9:00 AM EST by telephone and verified that I am speaking with the correct person using two identifiers.   I discussed the limitations, risks, security and privacy concerns of performing an evaluation and management service by telephone and the availability of in person appointments. I also discussed with the patient that there may be a patient responsible charge related to this service. The patient expressed understanding and agreed to proceed.   I discussed the assessment and treatment plan with the patient. The patient was provided an opportunity to ask questions and all were answered. The patient agreed with the plan and demonstrated an understanding of the instructions.   The patient was advised to call back or seek an in-person evaluation if the symptoms worsen or if the condition fails to improve as anticipated.  I provided 15  minutes of non-face-to-face time during this encounter.   [name], NP   [facility] Discharge Summary  Chelsea Hoover [id number]  Admission date: 09/17/2119 Discharge date: 10/08/2019  Reason for admission: Per admission assessment note: Chelsea Hoover is a 24 y.o. Caucasian female presents after recent inpatient admission.  Patient reports worsening depression and anxiety.  Reported a suicidal attempt to overdose on medication.  Reported diagnosis of bipolar disorder since the age of 66.  States she felt she was in a manic episode and states she checked herself inpatient.  Reports 3 previous inpatient admissions for overdose attempt.  Patient presents flat guarded but pleasant.  Patient unable to identify by any recent or new stressors.  States she is employed by [employer] however is on a leave of absence at this time.  Patient was enrolled in partial psychiatric program on 09/17/19.  Chemical Use History: Reported  history of marijuana use/abuse. Stated she recently stopped using x2 weeks prior.  Family of Origin Issues: Reported her parents and current boyfriend have been supportive.  Progress in Program Toward Treatment Goals: Ongoing, patient attended and participated with daily group session with active and engaged participation.  Continues to endorse passive thoughts of self-harm.  Denying suicidal or homicidal ideations.  Denies auditory or visual hallucinations. Patient is requesting medications for GI upset refill possibility related to medications. Discussed patient to follow-up with Primary Care Provider.   Progress (rationale): Keep follow-up appointment with MD Akintayo  10/19/2019, Courtney Heys 10/13/2019  Take all medications as prescribed. Keep all follow-up appointments as scheduled.  Do not consume alcohol or use illegal drugs while on prescription medications. Report any adverse effects from your medications to your primary care provider promptly.  In the event of recurrent symptoms or worsening symptoms, call 911, a crisis hotline, or go to the nearest emergency department for evaluation.   [name], NP 10/05/2019

## 2019-10-05 NOTE — Psych (Signed)
Virtual Visit via Video Note  I connected with Chelsea Hoover on 10/05/19 at  9:00 AM EST by a video enabled telemedicine application and verified that I am speaking with the correct person using two identifiers.   I discussed the limitations of evaluation and management by telemedicine and the availability of in person appointments. The patient expressed understanding and agreed to proceed.  I discussed the assessment and treatment plan with the patient. The patient was provided an opportunity to ask questions and all were answered. The patient agreed with the plan and demonstrated an understanding of the instructions.   The patient was advised to call back or seek an in-person evaluation if the symptoms worsen or if the condition fails to improve as anticipated.  Pt was provided 90 minutes of non-face-to-face time during this encounter.   Chelsea Guiles, LCSW    [facility] PHP THERAPIST PROGRESS NOTE  Chelsea Hoover [id number]  Session Time: 9:00 - 10:00  Participation Level: Active  Behavioral Response: CasualAlertDepressed  Type of Therapy: Individual Therapy  Treatment Goals addressed: Coping  Interventions: CBT, DBT, Supportive and Reframing  Summary: Clinician led check-in regarding current stressors and situation, and review of patient completed daily inventory. Clinician utilized active listening and empathetic response and validated patient emotions. Clinician facilitated processing group on pertinent issues.   Therapist Response:Chelsea Hoover is a 24 y.o. female who presents with depression and anxiety symptoms. Patient arrived within time allowed and reports that she is feeling "tired." Patient rates hermood at a 5on a scale of 1-10 with 10 being great. Pt presents with noticeably lower energy than previous days and states it is because she is tired. Pt states she is experiencing cramps and had a "low" day yesterday. Pt states she was "just feeling sad" and could not  identify any precipitating factors. Pt expresses experiencing resistance to utilizing skills the majority of the day and stared at the ceiling for a few hours. Pt reports she tried to play a game and watch a tv show but was unable to keep it up for long. Pt reports she drank 2-3 shots of liquor last night and it helped her head space. Pt states experiencing some passive SI in the afternoon and denies plan or intent.  Pt is able to process. Patient engaged in discussion.      Session Time: 10:00-10:30  Participation Level:Active  Behavioral Response:CasualAlertDepressed  Type of Therapy: Individual Therapy  Treatment Goals addressed: Coping  Interventions:CBT, DBT, Solution Focused, Supportive and Reframing  Summary:Cln introduced distress tolerance skills including what they are, how to use them, and their purpose. Cln introduced the STOP skill and how to utilize it.  Cln assessed for SI/HI and acute needs prior to pt leaving group.   Therapist Response: Pt reports understanding of topic and states STOP skill can be useful when she is ruminating. Pt presents with low energy and states about 10:30 that she is feeling light headed and needs to lie down. Pt denies SI/HI or immediate safety issues. Pt states she will inform her mother who is in the home that she is feeling light headed and to routinely check in on her. Pt states she will be in group tomorrow.       Suicidal/Homicidal: Nowithout intent/plan  Plan: Pt will continue in PHP while working to decrease depression and anxiety symptoms and increase ability to manage symptoms in a healthy manner as they arise.   Diagnosis: Bipolar 1 disorder, depressed (HCC) [F31.9]    1. Bipolar 1  disorder, depressed ([place])       [name], LCSW 10/05/2019

## 2019-10-05 NOTE — Progress Notes (Signed)
Logged in to [employer] to speak with patient. Patient was not available as she had to leave group due to feeling sick. Will try again tomorrow.

## 2019-10-06 ENCOUNTER — Other Ambulatory Visit: Payer: Self-pay

## 2019-10-06 ENCOUNTER — Other Ambulatory Visit (HOSPITAL_COMMUNITY): Payer: BC Managed Care – PPO

## 2019-10-08 ENCOUNTER — Encounter (HOSPITAL_COMMUNITY): Payer: Self-pay | Admitting: Specialist

## 2019-10-08 ENCOUNTER — Other Ambulatory Visit (HOSPITAL_COMMUNITY): Payer: BC Managed Care – PPO | Admitting: Specialist

## 2019-10-08 ENCOUNTER — Other Ambulatory Visit: Payer: Self-pay

## 2019-10-08 ENCOUNTER — Other Ambulatory Visit (HOSPITAL_COMMUNITY): Payer: BC Managed Care – PPO | Admitting: Licensed Clinical Social Worker

## 2019-10-08 DIAGNOSIS — F3132 Bipolar disorder, current episode depressed, moderate: Secondary | ICD-10-CM

## 2019-10-08 DIAGNOSIS — R4589 Other symptoms and signs involving emotional state: Secondary | ICD-10-CM

## 2019-10-08 DIAGNOSIS — F319 Bipolar disorder, unspecified: Secondary | ICD-10-CM

## 2019-10-08 NOTE — Progress Notes (Signed)
Spoke with patient via Web[employer] video call, used 2 identifiers to correctly identify patient. She is being discharged today from program and going back to her regular therapist. She has "loved the group." Sometimes she felt 4 hours was a lot but still really enjoyed it. No side effects from medication that she noticed. Still feels tired during the days and is unaware if it is medication or depression. On scale 1-10 as 10 being worst she rates depression at 5 and anxiety at 7. Denies SI/HI or AV hallucinations. PHQ9=16. No other issues or complaints.

## 2019-10-08 NOTE — Therapy (Addendum)
[place], [place], [address] Phone: [phone]   Fax:  [phone] Virtual Visit via Video Note  I connected with Chelsea Hoover on 10/08/19 at  11:00 AM EST by a video enabled telemedicine application and verified that I am speaking with the correct person using two identifiers.   I discussed the limitations of evaluation and management by telemedicine and the availability of in person appointments. The patient expressed understanding and agreed to proceed.  The patient was provided an opportunity to ask questions and all were answered. The patient agreed with the plan and demonstrated an understanding of the instructions.   The patient was advised to call back or seek an in-person evaluation if the symptoms worsen or if the condition fails to improve as anticipated.  I provided 60 minutes of non-face-to-face time during this encounter.      Occupational Therapy Treatment  Patient Details  Name: Chelsea Hoover MRN: [id number] Date of Birth: 1996-01-30 Referring Provider (OT): Ricky Ala, NP   Encounter Date: 10/08/2019  OT End of Session - 10/08/19 1352    Visit Number  4    Number of Visits  12    Date for OT Re-Evaluation  10/14/19    Authorization Type  [employer]    OT Start Time  1100    OT Stop Time  1200    OT Time Calculation (min)  60 min    Activity Tolerance  Patient tolerated treatment well    Behavior During Therapy  WFL for tasks assessed/performed       Past Medical History:  Diagnosis Date  . Anxiety   . 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 Bipolar 1 disorder ([place])   . Cannabis abuse 09/03/2015  . Depression   . Irritable bowel syndrome   . Lyme disease   . Mood disorder ([place])   . PTSD (post-traumatic stress disorder) 09/01/2015    Past Surgical History:  Procedure Laterality Date  . TONSILECTOMY/ADENOIDECTOMY WITH MYRINGOTOMY Bilateral    August 2020     There were no vitals filed for this visit.  Subjective Assessment - 10/08/19 1352    Currently in Pain?  No/denies       S:  I dont like my job.  It is much too stressful.  I dont know what soft skills are.  O:  Group members began group by sharing their current role and their level of satisfaction and/or desire for change in their current role.  Therapist defined hard skills and soft skills as key components of preparing for, achieving, and maintaining job placement.  Group members were able to identify several soft skills, and therapist then expanded on those identified.  Group discussed the following soft skills at length:  attitude, communication, planning and organization, critical thinking, interpersonal skills, teamwork, professionalism, media rules, and flexibility.  After defining these soft skills and discussing their key importance in the workplace, group members self-reflected and rated their current skill level with each area from 1 (poor) to 5 (great).  The group shared their scores with the group and then identified 1-2 areas they most wanted to improved.   Chelsea Hoover selected attitude and planning as the areas she would most like to improve.  OT then educated group on several strategies to improve attitude, including beginning each day with a positive thought, planning ahead and organizing for a smoother day, managing your own expectations for the day, setting conversation tones, and reflecting on  one good event that happened each day.   A:  Chelsea Hoover was engage throughout group.  Once educated on what soft skills were, she was able to self-reflect on soft skills that she excelled in, as well as several that she would like to improve upon.   P:  Complete Career Interest Checklist.  DC from skilled OT intervention this date.                      OT Education - 10/08/19 1352    Education Details  educated on soft skills in context to job searching and job [employer]) Educated  Patient    Methods  Explanation;Handout    Comprehension  Verbalized understanding       OT Short Term Goals - 10/08/19 1357      OT SHORT TERM GOAL #1   Title  Pt will be educated on strategies to improve psychosocial skills needed to participate fully in all daily, work, and leisure activities    Time  4    Period  Weeks    Status  Achieved    Target Date  10/14/19      OT [place] #2   Title  Pt will apply psychosocial skills and coping mechanisms to daily activities in order to function independently and reintegrate into community    Time  4    Period  Weeks    Status  Achieved      OT SHORT TERM GOAL #3   Title  Pt will engage in goal setting to improve functional BADL/IADL routine upon reintegrating into community.    Time  4    Period  Weeks    Status  Achieved      OT SHORT TERM GOAL #4   Title  Pt will identify 1-3 new job opportunities in current vocational search for improved community integration    Time  4    Period  Weeks    Status  Partially Met               Plan - 10/08/19 1353    Body Structure / Function / Physical Skills  ADL;IADL    Cognitive Skills  Emotional;Energy/Drive    Psychosocial Skills  Optometrist and Behaviors;Habits;Interpersonal Interaction    OT Treatment/Interventions  Self-care/ADL training;Psychosocial skills training;Coping strategies training;Other (comment)       Patient will benefit from skilled therapeutic intervention in order to improve the following deficits and impairments:   Body Structure / Function / Physical Skills: ADL, IADL Cognitive Skills: Emotional, Energy/Drive Psychosocial Skills: Coping Strategies, Routines and Behaviors, Habits, Interpersonal Interaction   Visit Diagnosis: Bipolar 1 disorder, depressed (HCC)  Bipolar affective disorder, currently depressed, moderate ([place])  Difficulty coping    Problem List Patient Active Problem List   Diagnosis  Date Noted  . Bipolar 1 disorder ([place]) 09/07/2019  . Cannabis abuse 09/03/2015  . Bipolar 1 disorder, depressed ([place]) 09/01/2015  . 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 PTSD (post-traumatic stress disorder) 09/01/2015    Vangie Bicker, [place], OTR/L [phone]  10/08/2019, 2:00 PM OCCUPATIONAL THERAPY DISCHARGE SUMMARY  Visits from Start of Care: 4  Current functional level related to goals / functional outcomes: See above   Remaining deficits: n/a   Education / Equipment: See above  Plan: Patient agrees to discharge.  Patient goals were partially met. Patient is being discharged due to meeting the stated rehab goals.  ?????  Vangie Bicker, MHA, OTR/L [place], [place], [address] Phone: [phone]   Fax:  [phone]  Name: Chelsea Hoover MRN: [id number] Date of Birth: [birth year]

## 2019-10-12 NOTE — Psych (Signed)
Virtual Visit via Video Note  I connected with Chelsea Hoover on 10/08/19 at  9:00 AM EST by a video enabled telemedicine application and verified that I am speaking with the correct person using two identifiers.   I discussed the limitations of evaluation and management by telemedicine and the availability of in person appointments. The patient expressed understanding and agreed to proceed.  I discussed the assessment and treatment plan with the patient. The patient was provided an opportunity to ask questions and all were answered. The patient agreed with the plan and demonstrated an understanding of the instructions.   The patient was advised to call back or seek an in-person evaluation if the symptoms worsen or if the condition fails to improve as anticipated.  Pt was provided 240 minutes of non-face-to-face time during this encounter.   [name], LCSW    [facility] PHP THERAPIST PROGRESS NOTE  Chelsea Hoover [id number]  Session Time: 9:00 - 10:00  Participation Level: Active  Behavioral Response: CasualAlertDepressed  Type of Therapy: Group Therapy  Treatment Goals addressed: Coping  Interventions: CBT, DBT, Supportive and Reframing  Summary: Clinician led check-in regarding current stressors and situation, and review of patient completed daily inventory. Clinician utilized active listening and empathetic response and validated patient emotions. Clinician facilitated processing group on pertinent issues.   Therapist Response:Chelsea Hoover is a 24 y.o. female who presents with depression and anxiety symptoms. Patient arrived within time allowed and reports that she is feeling "just tired." Patient rates hermood at a 5on a scale of 1-10 with 10 being great. Pt reports she has been having technical issues and that is the reason for her absence. She states her mood has been "pretty good" however her omtivation continues to be low. Pt states she did get bloodwork done to  seek answers about her dizziness and is proud of herself for making it happen. Pt reports increased anxiety due to finding out her sister has COVID again and they were sick at the same time with it at the end of the year, so pt feels more vulnerable now. Pt is able to process. Patient engaged in discussion.       Session Time: 10:00-11:00  Participation Level:Active  Behavioral Response:CasualAlertDepressed  Type of Therapy: Group Therapy, psychoeducation, psychotherapy  Treatment Goals addressed: Coping  Interventions:CBT, DBT, Solution Focused, Supportive and Reframing  Summary:Cln led discussion on planning ahead as an anxiety management tool. Cln introduced concept of "scripting" as well as planning generic phrases to use in unplanned for situations. Group members shared current anxieties and practiced planning ahead.  Therapist Response:  Pt engaged in discussion and activity. Pt expresses concern about returning to work. Pt is able to determine ways she can handle her feelings, energy, and recharge regardless what happens.        Session Time: 11:00- 12:00  Participation Level:Active  Behavioral Response:CasualAlertDepressed  Type of Therapy: Group Therapy, OT  Treatment Goals addressed: Coping  Interventions:Psychosocial skills training, Supportive,   Summary:Occupational Therapy group  Therapist Response:Patient engaged in group. See OT note.       Session Time: 12:00 -1:00  Participation Level:Active  Behavioral Response:CasualAlertDepressed  Type of Therapy: Group Therapy, Psychoeducation; Psychotherapy  Treatment Goals addressed: Coping  Interventions:CBT; Solution focused; Supportive; Reframing  Summary:12:00 - 12:50:Cln introduced topic of Distress Tolerance skills.  Cln utilized DBT framework to discusse the purpose of distress tolerance skills and how to apply them. Cln reviewed ACCEPTS skills and  group discussed ways to practice these skills.  12:50 -1:00 Clinician led check-out. Clinician assessed for immediate needs, medication compliance and efficacy, and safety concerns  Therapist Response:12:00 - 12:50:Pt engaged in discussion and reports understanding of ACCEPTS skills. Pt identifies listening to music, coloring, and pushing away as ways she can practice.   12:50 - 1:00: At check-out, patient rates her mood at a 7 on a scale of 1-10 with 10 being great.Pt states afternoon plans of "finding something to do." Patient demonstrates someprogress as evidenced by maintaining level mood over the past few days.Patient denies SI/HI/self-harm at the end of group.     Suicidal/Homicidal: Nowithout intent/plan  Plan: Pt will discharge from PHP due to meeting treatment goals of decreased depression and anxiety symptoms and increased ability to manage symptoms in a healthy manner as they arise. Pt has declined IOP due to desire to return to work and will return to previous providers. Pt has follow up appointments with Aquilla Solian for therapy on 2/10 at 1pm and with Dr Jannifer Franklin for psychiatry on 2/16 at 11:30.  Pt and provider are aligned with discharge. Pt denies SI/HI at time of discharge.   Diagnosis: Bipolar 1 disorder, depressed (HCC) [F31.9]    1. Bipolar 1 disorder, depressed (HCC)       [name], LCSW 10/12/2019

## 2019-11-26 ENCOUNTER — Inpatient Hospital Stay (HOSPITAL_COMMUNITY)
Admission: AD | Admit: 2019-11-26 | Discharge: 2019-11-29 | DRG: 885 | Disposition: A | Discharge status: Home / Self Care | Payer: BC Managed Care – PPO | Attending: Psychiatry | Admitting: Psychiatry

## 2019-11-26 ENCOUNTER — Other Ambulatory Visit: Payer: Self-pay

## 2019-11-26 ENCOUNTER — Encounter (HOSPITAL_COMMUNITY): Payer: Self-pay | Admitting: Psychiatry

## 2019-11-26 DIAGNOSIS — F3181 Bipolar II disorder: Secondary | ICD-10-CM | POA: Diagnosis not present

## 2019-11-26 DIAGNOSIS — K589 Irritable bowel syndrome without diarrhea: Secondary | ICD-10-CM | POA: Diagnosis present

## 2019-11-26 DIAGNOSIS — F4310 Post-traumatic stress disorder, unspecified: Secondary | ICD-10-CM | POA: Diagnosis present

## 2019-11-26 DIAGNOSIS — R45851 Suicidal ideations: Secondary | ICD-10-CM | POA: Diagnosis present

## 2019-11-26 DIAGNOSIS — Z915 Personal history of self-harm: Secondary | ICD-10-CM

## 2019-11-26 DIAGNOSIS — G4700 Insomnia, unspecified: Secondary | ICD-10-CM | POA: Diagnosis present

## 2019-11-26 DIAGNOSIS — F410 Panic disorder [episodic paroxysmal anxiety] without agoraphobia: Secondary | ICD-10-CM | POA: Diagnosis present

## 2019-11-26 DIAGNOSIS — K219 Gastro-esophageal reflux disease without esophagitis: Secondary | ICD-10-CM | POA: Diagnosis present

## 2019-11-26 DIAGNOSIS — F39 Unspecified mood [affective] disorder: Secondary | ICD-10-CM | POA: Diagnosis present

## 2019-11-26 DIAGNOSIS — Z20822 Contact with and (suspected) exposure to covid-19: Secondary | ICD-10-CM | POA: Diagnosis present

## 2019-11-26 DIAGNOSIS — Z818 Family history of other mental and behavioral disorders: Secondary | ICD-10-CM

## 2019-11-26 DIAGNOSIS — R4587 Impulsiveness: Secondary | ICD-10-CM | POA: Diagnosis present

## 2019-11-26 LAB — RESPIRATORY PANEL BY RT PCR (FLU A&B, COVID)
Influenza A by PCR: NEGATIVE
Influenza B by PCR: NEGATIVE
SARS Coronavirus 2 by RT PCR: NEGATIVE
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
## 2019-11-26 NOTE — Progress Notes (Signed)
Patient ID: Chelsea Hoover, female   DOB: 06/22/1996, 24 y.o.   MRN: [id number] Pt A&O x 4, no distress noted, calm & cooperative, presents with SI, plan to overdose on meds, pt reports her meds aren't working.  Denies HI or AVH.  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  Skin search completed, monitoring for safety.

## 2019-11-26 NOTE — H&P (Signed)
Chelsea Hoover  Chelsea Hoover is an 24 y.o. female with depression and suicidal thoughts. See therapist note. She reports a history of two suicide attempts that were interrupted by mom ": she heard me crying loudly both times and came in and stopped me. " patient with bizarre presentation, flat and guarded yet able to engage. She presents with some suicidal ideations and states she is unable to keep herself safe if she goes home. As per patient I dont think my medications are working.     Past Psychiatric History of bipolar disorder with previous hospitalizations. Most recently hospitalized at [facility] on 09/2019 and 2 previous attempts. discharged on Wellbutrin, Abilify, Lamictal, and trazodone. Reports a suicide attempt that was aborted by mother x 2. History of periods with increased impulsivity and activity and decreased sleep. History of self-cutting years ago. Denies history of psychosis.  Total Time spent with patient: 15 minutes  Psychiatric Specialty Hoover: Physical Hoover  Constitutional: She is oriented to person, place, and time. She appears well-developed and well-nourished. No distress.  HENT:  Head: Normocephalic and atraumatic.  Right Ear: External ear normal.  Left Ear: External ear normal.  Eyes: Pupils are equal, round, and reactive to light. Right eye exhibits no discharge. Left eye exhibits no discharge.  Respiratory: Effort normal. No respiratory distress.  Musculoskeletal:        General: Normal range of motion.  Neurological: She is alert and oriented to person, place, and time.  Skin: She is not diaphoretic.  Psychiatric: Her mood appears anxious. She is not withdrawn and not actively hallucinating. Thought content is not paranoid and not delusional. She exhibits a depressed mood. She expresses suicidal ideation. She expresses no homicidal ideation. She expresses suicidal plans.    Review of Systems  Constitutional: Positive for activity change  and appetite change. Negative for chills, diaphoresis, fatigue, fever and unexpected weight change.  Respiratory: Negative for cough, shortness of breath and wheezing.   Cardiovascular: Negative for chest pain.  Gastrointestinal: Negative for diarrhea, nausea and vomiting.  Musculoskeletal: Negative.   Neurological: Negative.   Psychiatric/Behavioral: Positive for decreased concentration, dysphoric mood, sleep disturbance and suicidal ideas. Negative for hallucinations. The patient is nervous/anxious.     Blood pressure 128/85, pulse 88, temperature 98.8 F (37.1 C), temperature source Oral, resp. rate 18, SpO2 99 %.There is no height or weight on file to calculate BMI.  General Appearance: Casual and Fairly Groomed , blue dyed hair  Eye Contact:  Fair  Speech:  Clear and Coherent and Slow  Volume:  Decreased  Mood:  Anxious, Depressed, Hopeless and Worthless  Affect:  Congruent, Depressed and Flat  Thought Process:  Coherent, Goal Directed, Linear and Descriptions of Associations: Intact  Orientation:  Full (Time, Place, and Person)  Thought Content:  Logical and Hallucinations: None  Suicidal Thoughts:  Yes.  with intent/plan  Homicidal Thoughts:  No  Memory:  Immediate;   Good Recent;   Good  Judgement:  Intact  Insight:  Lacking  Psychomotor Activity:  Normal  Concentration: Concentration: Fair  Recall:  Good  Fund of Knowledge:Good  Language: Good  Akathisia:  Negative  Handed:  Right  AIMS (if indicated):     Assets:  Communication Skills Desire for Improvement Financial Resources/Insurance Housing Leisure Time Physical Health  Sleep:       Musculoskeletal: Strength & Muscle Tone: within normal limits Gait & Station: normal Patient leans: N/A  Blood pressure 128/85, pulse 88, temperature 98.8 F (37.1 C),  temperature source Oral, resp. rate 18, SpO2 99 %.  Recommendations:  Based on my evaluation the patient does not appear to have an emergency medical  condition. Recommend inpatient admission at this time due to her risk factors. Discussed with patient that inpatient admission may not be beneficial to her if she is unable to practice her coping skills and behavior techniques. Recently just hospitalized and completed PHP program in January. Patient with strong family history of mental illness and remains high risk for suicide completion. May benefit from DBT in the near future to help regulate emotions and cope with stress.    Maryagnes Amos, FNP 11/26/2019, 5:00 PM

## 2019-11-27 DIAGNOSIS — F3181 Bipolar II disorder: Secondary | ICD-10-CM | POA: Diagnosis present

## 2019-11-27 DIAGNOSIS — K589 Irritable bowel syndrome without diarrhea: Secondary | ICD-10-CM | POA: Diagnosis present

## 2019-11-27 DIAGNOSIS — F4310 Post-traumatic stress disorder, unspecified: Secondary | ICD-10-CM | POA: Diagnosis present

## 2019-11-27 DIAGNOSIS — R45851 Suicidal ideations: Secondary | ICD-10-CM | POA: Diagnosis present

## 2019-11-27 DIAGNOSIS — Z20822 Contact with and (suspected) exposure to covid-19: Secondary | ICD-10-CM | POA: Diagnosis present

## 2019-11-27 DIAGNOSIS — R4587 Impulsiveness: Secondary | ICD-10-CM | POA: Diagnosis present

## 2019-11-27 DIAGNOSIS — F39 Unspecified mood [affective] disorder: Secondary | ICD-10-CM | POA: Diagnosis present

## 2019-11-27 DIAGNOSIS — K219 Gastro-esophageal reflux disease without esophagitis: Secondary | ICD-10-CM | POA: Diagnosis present

## 2019-11-27 DIAGNOSIS — Z915 Personal history of self-harm: Secondary | ICD-10-CM | POA: Diagnosis not present

## 2019-11-27 DIAGNOSIS — G4700 Insomnia, unspecified: Secondary | ICD-10-CM | POA: Diagnosis present

## 2019-11-27 DIAGNOSIS — F410 Panic disorder [episodic paroxysmal anxiety] without agoraphobia: Secondary | ICD-10-CM | POA: Diagnosis present

## 2019-11-27 DIAGNOSIS — Z818 Family history of other mental and behavioral disorders: Secondary | ICD-10-CM | POA: Diagnosis not present

## 2019-11-27 LAB — CBC
HCT: 39.8 % (ref 36.0–46.0)
Hemoglobin: 12.2 g/dL (ref 12.0–15.0)
MCH: 24.5 pg — ABNORMAL LOW (ref 26.0–34.0)
MCHC: 30.7 g/dL (ref 30.0–36.0)
MCV: 80.1 fL (ref 80.0–100.0)
Platelets: 462 10*3/uL — ABNORMAL HIGH (ref 150–400)
RBC: 4.97 MIL/uL (ref 3.87–5.11)
RDW: 17.2 % — ABNORMAL HIGH (ref 11.5–15.5)
WBC: 13.2 10*3/uL — ABNORMAL HIGH (ref 4.0–10.5)
nRBC: 0 % (ref 0.0–0.2)

## 2019-11-27 LAB — PREGNANCY, URINE: Preg Test, Ur: NEGATIVE

## 2019-11-27 LAB — RAPID URINE DRUG SCREEN, HOSP PERFORMED
Amphetamines: NOT DETECTED
Barbiturates: NOT DETECTED
Benzodiazepines: NOT DETECTED
Cocaine: NOT DETECTED
Opiates: NOT DETECTED
Tetrahydrocannabinol: NOT DETECTED

## 2019-11-27 LAB — COMPREHENSIVE METABOLIC PANEL
ALT: 17 U/L (ref 0–44)
AST: 17 U/L (ref 15–41)
Albumin: 4.8 g/dL (ref 3.5–5.0)
Alkaline Phosphatase: 74 U/L (ref 38–126)
Anion gap: 11 (ref 5–15)
BUN: 15 mg/dL (ref 6–20)
CO2: 25 mmol/L (ref 22–32)
Calcium: 9.6 mg/dL (ref 8.9–10.3)
Chloride: 103 mmol/L (ref 98–111)
Creatinine, Ser: 1.12 mg/dL — ABNORMAL HIGH (ref 0.44–1.00)
GFR calc Af Amer: >60 mL/min (ref >60)
GFR calc non Af Amer: >60 mL/min (ref >60)
Glucose, Bld: 90 mg/dL (ref 70–99)
Potassium: 3.4 mmol/L — ABNORMAL LOW (ref 3.5–5.1)
Sodium: 139 mmol/L (ref 135–145)
Total Bilirubin: 0.7 mg/dL (ref 0.3–1.2)
Total Protein: 8.1 g/dL (ref 6.5–8.1)

## 2019-11-27 MED ORDER — TRAZODONE HCL 50 MG PO TABS
75.0000 mg | ORAL_TABLET | Freq: Every day | ORAL | Status: DC
  Administered 2019-11-27 – 2019-11-28 (×2): 75 mg via ORAL
  Filled 2019-11-27 (×4): qty 1.5

## 2019-11-27 MED ORDER — ALUM & MAG HYDROXIDE-SIMETH 200-200-20 MG/5ML PO SUSP
30.0000 mL | ORAL | Status: DC | PRN
  Administered 2019-11-27: 30 mL via ORAL

## 2019-11-27 MED ORDER — DULOXETINE HCL 20 MG PO CPEP
20.0000 mg | ORAL_CAPSULE | Freq: Every day | ORAL | Status: DC
  Administered 2019-11-27 – 2019-11-29 (×3): 20 mg via ORAL
  Filled 2019-11-27 (×5): qty 1

## 2019-11-27 MED ORDER — TRAZODONE HCL 50 MG PO TABS
50.0000 mg | ORAL_TABLET | Freq: Every evening | ORAL | Status: DC | PRN
  Administered 2019-11-27: 02:00:00 50 mg via ORAL
  Filled 2019-11-27: qty 1

## 2019-11-27 MED ORDER — DICYCLOMINE HCL 10 MG PO CAPS
10.0000 mg | ORAL_CAPSULE | Freq: Three times a day (TID) | ORAL | Status: DC
  Administered 2019-11-27 – 2019-11-29 (×9): 10 mg via ORAL
  Filled 2019-11-27 (×17): qty 1

## 2019-11-27 MED ORDER — FAMOTIDINE 20 MG PO TABS
20.0000 mg | ORAL_TABLET | Freq: Two times a day (BID) | ORAL | Status: DC
  Administered 2019-11-27 – 2019-11-29 (×5): 20 mg via ORAL
  Filled 2019-11-27 (×9): qty 1

## 2019-11-27 MED ORDER — MAGNESIUM HYDROXIDE 400 MG/5ML PO SUSP: 30.0000 mL | Freq: Every day | ORAL | Status: DC | PRN

## 2019-11-27 MED ORDER — ACETAMINOPHEN 325 MG PO TABS: 650.0000 mg | ORAL_TABLET | Freq: Four times a day (QID) | ORAL | Status: DC | PRN

## 2019-11-27 MED ORDER — HYDROXYZINE HCL 25 MG PO TABS: 25.0000 mg | ORAL_TABLET | Freq: Three times a day (TID) | ORAL | Status: DC | PRN

## 2019-11-27 MED ORDER — ARIPIPRAZOLE 10 MG PO TABS
10.0000 mg | ORAL_TABLET | Freq: Every day | ORAL | Status: DC
  Administered 2019-11-27 – 2019-11-29 (×3): 10 mg via ORAL
  Filled 2019-11-27 (×5): qty 1

## 2019-11-27 MED ORDER — LAMOTRIGINE 100 MG PO TABS
100.0000 mg | ORAL_TABLET | Freq: Two times a day (BID) | ORAL | Status: DC
  Administered 2019-11-27 – 2019-11-29 (×5): 100 mg via ORAL
  Filled 2019-11-27 (×9): qty 1

## 2019-11-27 NOTE — Tx Team (Signed)
Initial Treatment Plan 11/27/2019 1:47 AM Chelsea Hoover [id number]:[id number]    PATIENT STRESSORS: Other: Reports medications not working and her job is stressful   PATIENT STRENGTHS: Ability for insight Average or above average intelligence Capable of independent living Barrister's clerk for treatment/growth Physical Health Supportive family/friends   PATIENT IDENTIFIED PROBLEMS:    " I would like help with a medication change"    I would like to develop new coping skills"   Depressed and having SI               DISCHARGE CRITERIA:  Adequate post-discharge living arrangements Improved stabilization in mood, thinking, and/or behavior Reduction of life-threatening or endangering symptoms to within safe limits  PRELIMINARY DISCHARGE PLAN: Outpatient therapy Return to previous living arrangement  PATIENT/FAMILY INVOLVEMENT: This treatment plan has been presented to and reviewed with the patient, Chelsea Hoover, and/or family member, .  The patient and family have been given the opportunity to ask questions and make suggestions.  Andres Ege, RN 11/27/2019, 1:47 AM

## 2019-11-27 NOTE — BHH Group Notes (Signed)
LCSW Group Therapy Note  11/27/2019   1100a  Type of Therapy and Topic:  Group Therapy: Anger Cues and Responses  Participation Level:  Active   Description of Group:   In this group, patients learned how to recognize the physical, cognitive, emotional, and behavioral responses they have to anger-provoking situations.  They identified a recent time they became angry and how they reacted.  They analyzed how their reaction was possibly beneficial and how it was possibly unhelpful.  The group discussed a variety of healthier coping skills that could help with such a situation in the future.  Focus was placed on how helpful it is to recognize the underlying emotions to our anger, because working on those can lead to a more permanent solution as well as our ability to focus on the important rather than the urgent.  Therapeutic Goals: 1. Patients will remember their last incident of anger and how they felt emotionally and physically, what their thoughts were at the time, and how they behaved. 2. Patients will identify how their behavior at that time worked for them, as well as how it worked against them. 3. Patients will explore possible new behaviors to use in future anger situations. 4. Patients will learn that anger itself is normal and cannot be eliminated, and that healthier reactions can assist with resolving conflict rather than worsening situations.  Summary of Patient Progress:  The patient actively engaged in introductory discussion, detailing of her thoughts surrounding anger, sharing that she cries and is not good at controlling her anger. Pt shared that her most recent time of anger was during a disagreement with her mother and said her abilities to manage her anger and take time to process her feelings before further engaging with her mother proved to be beneficial for her. Pt actively engaged in exploration and processing of alternate behaviors that can be used in response to anger,  detailing various different activities she proves able to focus on. Pt proved receptive to discussion surrounding anger being a normal occurring feeling and proved able to further identify alternate healthy means of resolving conflict and anger. Pt proved receptive to feedback provided by alternate group members and group facilitator.  Therapeutic Modalities:   Cognitive Behavioral Therapy    Micheline Maze 11/27/2019  2:49 PM

## 2019-11-27 NOTE — Progress Notes (Signed)
Patient transferred from OBS to adult unit. Vitals obtained and patient signed paperwork. Admission completed. Given sleep medication and went to bed. Given cup for urine tests ordered.

## 2019-11-27 NOTE — H&P (Signed)
Psychiatric Admission Assessment Adult  Patient Identification: Chelsea Hoover  MRN:  [id number]  Date of Evaluation:  11/27/2019  Chief Complaint:  Bipolar II disorder (HCC) [F31.81]  Principal Diagnosis: Bipolar II disorder (HCC)  Diagnosis:  Principal Problem:   Bipolar II disorder (HCC)  History of Present Illness: (Per Md's admission SRA notes):  Patient is a 24 year old female with a past psychiatric history significant for posttraumatic stress disorder and bipolar disorder type II who presented as a walk-in to the behavioral health hospital on 11/26/2019 with worsening depression.  The patient stated that her medications had stopped working.  She stated that she had become increasingly more depressed, having anhedonia, isolating, feelings of worthlessness and guilt.  She is followed by Dr. Jannifer Franklin as an outpatient, but had not seen him in a while.  The patient had been recently admitted to the hospital here in January 2021.  Her medications on discharge included Lamictal, Abilify and Wellbutrin.  After that hospitalization she was in the intensive outpatient program/partial hospital program.  She continued in that until October 06, 2019.  She stated that she had been on several antidepressants in the past, and had side effects or problems with it.  It sounds like they had attempted to increase her Wellbutrin, but "it made me worse".  She stated she felt as though that the Lamictal was beneficial, and wanted to make sure we would continue that.  She was admitted to the hospital for evaluation and stabilization.  Associated Signs/Symptoms:  Depression Symptoms:  depressed mood, insomnia, hopelessness, suicidal thoughts without plan, anxiety, panic attacks, increased appetite,  (Hypo) Manic Symptoms:  Labiality of Mood,  Anxiety Symptoms:  Excessive Worry, Panic Symptoms,  Psychotic Symptoms:  Denies any hallucinations, delusions or paranoia  PTSD Symptoms: Denies any PTSD  symptoms & or events.  Total Time spent with patient: 1 hour  Past Psychiatric History: Bipolar disorder with previous hospitalizations. Most recently hospitalized at [facility] last January, 2021. Has been on the child/adolescent unit in 2016 and discharged on Celexa, Lamictal, and trazodone. One suicide attempt years ago by overdose on pills. History of periods with increased impulsivity and activity and decreased sleep. History of self-cutting years ago. Denies history of psychosis.  Is the patient at risk to self? No.  Has the patient been a risk to self in the past 6 months? Yes.    Has the patient been a risk to self within the distant past? Yes.    Is the patient a risk to others? No.  Has the patient been a risk to others in the past 6 months? No.  Has the patient been a risk to others within the distant past? No.   Prior Inpatient Therapy: Prior Inpatient Therapy: Yes Prior Therapy Dates: multiple Prior Therapy Facilty/Provider(s): [facility] Reason for Treatment: Depression Prior Outpatient Therapy: Prior Outpatient Therapy: Yes Prior Therapy Dates: Jan & Feb 2021 Prior Therapy Facilty/Provider(s): [facility] Reason for Treatment: Depression Does patient have an ACCT team?: No Does patient have Intensive In-House Services?  : No Does patient have [facility] services? : No Does patient have P4CC services?: No  Alcohol Screening: 1. How often do you have a drink containing alcohol?: Never 2. How many drinks containing alcohol do you have on a typical day when you are drinking?: 1 or 2 3. How often do you have six or more drinks on one occasion?: Never AUDIT-C Score: 0 4. How often during the last year have you found that you were not able to  stop drinking once you had started?: Never 5. How often during the last year have you failed to do what was normally expected from you becasue of drinking?: Never 6. How often during the last year have you needed a first drink in the morning to get  yourself going after a heavy drinking session?: Never 7. How often during the last year have you had a feeling of guilt of remorse after drinking?: Never 8. How often during the last year have you been unable to remember what happened the night before because you had been drinking?: Never 9. Have you or someone else been injured as a result of your drinking?: No 10. Has a relative or friend or a doctor or another health worker been concerned about your drinking or suggested you cut down?: No Alcohol Use Disorder Identification Test Final Score (AUDIT): 0 Alcohol Brief Interventions/Follow-up: AUDIT Score <7 follow-up not indicated  Substance Abuse History in the last 12 months:  Yes.    Consequences of Substance Abuse: Discussed with patient during this assessment.  Medical Consequences:  Liver damage, Possible death by overdose Legal Consequences:  Arrests, jail time, Loss of driving privilege. Family Consequences:  Family discord, divorce and or separation.  Previous Psychotropic Medications: Yes   Psychological Evaluations: No   Past Medical History:  Past Medical History:  Diagnosis Date  . Anxiety   . 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 Bipolar 1 disorder ([place])   . Cannabis abuse 09/03/2015  . Depression   . Irritable bowel syndrome   . Lyme disease   . Mood disorder ([place])   . PTSD (post-traumatic stress disorder) 09/01/2015    Past Surgical History:  Procedure Laterality Date  . TONSILECTOMY/ADENOIDECTOMY WITH MYRINGOTOMY Bilateral    August 2020   Family History:  Family History  Problem Relation Age of Onset  . Crohn's disease Father   . Bipolar disorder Maternal Aunt   . Schizophrenia Maternal Grandfather   . Bipolar disorder Maternal Grandfather    Family Psychiatric  History: Major depression: Sister. Tobacco Screening:    Social History:  Social History   Substance and Sexual Activity  Alcohol Use No     Social History   Substance and  Sexual Activity  Drug Use Yes  . Types: Marijuana   Comment: Few times a week    Additional Social History: Marital status: Long term relationship    Pain Medications: None Prescriptions: Welbutrin, Lamictal, Trazadone, Amogadone Over the Counter: Stomach acid reducer (hx of IBS) History of alcohol / drug use?: Yes Longest period of sobriety (when/how long): None  Name of Substance 1: alcohol 1 - Frequency: 2-3x weekly 1 - Duration: ongoing Name of Substance 2: THC 2 - Age of First Use: 24 years of age 47 - Amount (size/oz): One bowl 2 - Frequency: Nightly 2 - Duration: ongoing 2 - Last Use / Amount: 09/04/19  Allergies:   Allergies  Allergen Reactions  . Lactose Intolerance (Gi) Other (See Comments)    Pain in stomach  . Oxycodone Other (See Comments)    Intense vomiting  . Latex Rash   Lab Results:  Results for orders placed or performed during the hospital encounter of 11/26/19 (from the past 48 hour(s))  Respiratory Panel by RT PCR (Flu A&B, Covid) - Nasopharyngeal Swab     Status: None   Collection Time: 11/26/19  6:39 PM   Specimen: Nasopharyngeal Swab  Result Value Ref Range   SARS Coronavirus 2 by RT PCR NEGATIVE NEGATIVE  Comment: (NOTE) SARS-CoV-2 target nucleic acids are NOT DETECTED. The SARS-CoV-2 RNA is generally detectable in upper respiratoy specimens during the acute phase of infection. The lowest concentration of SARS-CoV-2 viral copies this assay can detect is 131 copies/mL. A negative result does not preclude SARS-Cov-2 infection and should not be used as the sole basis for treatment or other patient management decisions. A negative result may occur with  improper specimen collection/handling, submission of specimen other than nasopharyngeal swab, presence of viral mutation(s) within the areas targeted by this assay, and inadequate number of viral copies (<131 copies/mL). A negative result must be combined with clinical observations, patient  history, and epidemiological information. The expected result is Negative. Fact Sheet for Patients:  https://www.moore.com/ Fact Sheet for Healthcare Providers:  https://www.young.biz/ This test is not yet ap proved or cleared by the [place] FDA and  has been authorized for detection and/or diagnosis of SARS-CoV-2 by FDA under an Emergency Use Authorization (EUA). This EUA will remain  in effect (meaning this test can be used) for the duration of the COVID-19 declaration under Section 564(b)(1) of the Act, 21 U.S.C. section 360bbb-3(b)(1), unless the authorization is terminated or revoked sooner.    Influenza A by PCR NEGATIVE NEGATIVE   Influenza B by PCR NEGATIVE NEGATIVE    Comment: (NOTE) The Xpert Xpress SARS-CoV-2/FLU/RSV assay is intended as an aid in  the diagnosis of influenza from Nasopharyngeal swab specimens and  should not be used as a sole basis for treatment. Nasal washings and  aspirates are unacceptable for Xpert Xpress SARS-CoV-2/FLU/RSV  testing. Fact Sheet for Patients: https://www.moore.com/ Fact Sheet for Healthcare Providers: https://www.young.biz/ This test is not yet approved or cleared by the [place] FDA and  has been authorized for detection and/or diagnosis of SARS-CoV-2 by  FDA under an Emergency Use Authorization (EUA). This EUA will remain  in effect (meaning this test can be used) for the duration of the  Covid-19 declaration under Section 564(b)(1) of the Act, 21  U.S.C. section 360bbb-3(b)(1), unless the authorization is  terminated or revoked. Performed at [facility], [address], [place], [place] [address]   Pregnancy, urine     Status: None   Collection Time: 11/27/19  1:03 AM  Result Value Ref Range   Preg Test, Ur NEGATIVE NEGATIVE    Comment:        THE SENSITIVITY OF THIS METHODOLOGY IS >20 mIU/mL. Performed at [facility], [address] [place], [place] [address]   Urine rapid drug screen (hosp performed)not at [facility]     Status: None   Collection Time: 11/27/19  1:03 AM  Result Value Ref Range   Opiates NONE DETECTED NONE DETECTED   Cocaine NONE DETECTED NONE DETECTED   Benzodiazepines NONE DETECTED NONE DETECTED   Amphetamines NONE DETECTED NONE DETECTED   Tetrahydrocannabinol NONE DETECTED NONE DETECTED   Barbiturates NONE DETECTED NONE DETECTED    Comment: (NOTE) DRUG SCREEN FOR MEDICAL PURPOSES ONLY.  IF CONFIRMATION IS NEEDED FOR ANY PURPOSE, NOTIFY LAB WITHIN 5 DAYS. LOWEST DETECTABLE LIMITS FOR URINE DRUG SCREEN Drug Class                     Cutoff (ng/mL) Amphetamine and metabolites    1000 Barbiturate and metabolites    200 Benzodiazepine                 200 Tricyclics and metabolites     300 Opiates and metabolites  300 Cocaine and metabolites        300 THC                            50 Performed at [facility], [address] [place], [place] [address]   CBC     Status: Abnormal   Collection Time: 11/27/19  7:03 AM  Result Value Ref Range   WBC 13.2 (H) 4.0 - 10.5 K/uL   RBC 4.97 3.87 - 5.11 MIL/uL   Hemoglobin 12.2 12.0 - 15.0 g/dL   HCT 25.0 53.9 - 76.7 %   MCV 80.1 80.0 - 100.0 fL   MCH 24.5 (L) 26.0 - 34.0 pg   MCHC 30.7 30.0 - 36.0 g/dL   RDW 34.1 (H) 93.7 - 90.2 %   Platelets 462 (H) 150 - 400 K/uL   nRBC 0.0 0.0 - 0.2 %    Comment: Performed at [facility], [address], [place], [place] [address]  Comprehensive metabolic panel     Status: Abnormal   Collection Time: 11/27/19  7:03 AM  Result Value Ref Range   Sodium 139 135 - 145 mmol/L   Potassium 3.4 (L) 3.5 - 5.1 mmol/L   Chloride 103 98 - 111 mmol/L   CO2 25 22 - 32 mmol/L   Glucose, Bld 90 70 - 99 mg/dL    Comment: Glucose reference range applies only to samples taken after fasting for at least 8 hours.   BUN 15 6 - 20 mg/dL    Creatinine, Ser 5.32 (H) 0.44 - 1.00 mg/dL   Calcium 9.6 8.9 - 99.2 mg/dL   Total Protein 8.1 6.5 - 8.1 g/dL   Albumin 4.8 3.5 - 5.0 g/dL   AST 17 15 - 41 U/L   ALT 17 0 - 44 U/L   Alkaline Phosphatase 74 38 - 126 U/L   Total Bilirubin 0.7 0.3 - 1.2 mg/dL   GFR calc non Af Amer >60 >60 mL/min   GFR calc Af Amer >60 >60 mL/min   Anion gap 11 5 - 15    Comment: Performed at [facility], [address], [place], [place] [address]   Blood Alcohol level:  Lab Results  Component Value Date   [facility] <11 08/02/2014   Metabolic Disorder Labs:  Lab Results  Component Value Date   HGBA1C 5.3 09/07/2019   MPG 105 09/07/2019   MPG 120 09/01/2015   No results found for: PROLACTIN Lab Results  Component Value Date   CHOL 166 09/07/2019   TRIG 72 09/07/2019   HDL 63 09/07/2019   CHOLHDL 2.6 09/07/2019   VLDL 14 09/07/2019   LDLCALC 89 09/07/2019   LDLCALC 62 09/01/2015   Current Medications: Current Facility-Administered Medications  Medication Dose Route Frequency Provider Last Rate Last Admin  . acetaminophen (TYLENOL) tablet 650 mg  650 mg Oral Q6H PRN Nira Conn A, NP      . alum & mag hydroxide-simeth (MAALOX/MYLANTA) 200-200-20 MG/5ML suspension 30 mL  30 mL Oral Q4H PRN Nira Conn A, NP   30 mL at 11/27/19 0133  . ARIPiprazole (ABILIFY) tablet 10 mg  10 mg Oral Daily Antonieta Pert, MD   10 mg at 11/27/19 1104  . dicyclomine (BENTYL) capsule 10 mg  10 mg Oral TID AC & HS Antonieta Pert, MD   10 mg at 11/27/19 1104  . DULoxetine (CYMBALTA) DR capsule 20 mg  20 mg Oral Daily Antonieta Pertlary, Greg Lawson, MD   20 mg at 11/27/19 1104  . famotidine (PEPCID) tablet 20 mg  20 mg Oral BID Antonieta Pertlary, Greg Lawson, MD   20 mg at 11/27/19 1103  . hydrOXYzine (ATARAX/VISTARIL) tablet 25 mg  25 mg Oral TID PRN Nira ConnBerry, Jason A, NP      . lamoTRIgine (LAMICTAL) tablet 100 mg  100 mg Oral BID Antonieta Pertlary, Greg Lawson, MD   100 mg at 11/27/19 1103  . magnesium hydroxide (MILK OF MAGNESIA)  suspension 30 mL  30 mL Oral Daily PRN Nira ConnBerry, Jason A, NP      . traZODone (DESYREL) tablet 75 mg  75 mg Oral QHS Antonieta Pertlary, Greg Lawson, MD       PTA Medications: Medications Prior to Admission  Medication Sig Dispense Refill Last Dose  . b complex vitamins tablet Take 1 tablet by mouth daily.     . cholecalciferol (VITAMIN D3) 25 MCG (1000 UNIT) tablet Take 1,000 Units by mouth daily.     . Multiple Vitamin (MULTIVITAMIN WITH MINERALS) TABS tablet Take 1 tablet by mouth daily.     . ARIPiprazole (ABILIFY) 10 MG tablet Take 1 tablet (10 mg total) by mouth daily. 30 tablet 0 Unknown at Unknown time  . buPROPion (WELLBUTRIN XL) 150 MG 24 hr tablet Take 1 tablet (150 mg total) by mouth daily. 30 tablet 0 Unknown at Unknown time  . famotidine (PEPCID) 20 MG tablet Take 1 tablet (20 mg total) by mouth 2 (two) times daily. 60 tablet 0 Unknown at Unknown time  . hydrOXYzine (ATARAX/VISTARIL) 25 MG tablet Take 1 tablet by mouth 3 (three) times daily as needed.     . lamoTRIgine (LAMICTAL) 150 MG tablet Take 1 tablet (150 mg total) by mouth 2 (two) times daily. 60 tablet 0 Unknown at Unknown time  . Multiple Vitamin (MULTIVITAMIN WITH MINERALS) TABS tablet Take 1 tablet by mouth daily.   Unknown at Unknown time  . traZODone (DESYREL) 50 MG tablet Take 1.5 tablets (75 mg total) by mouth at bedtime as needed for sleep. 60 tablet 0 Unknown at Unknown time   Musculoskeletal: Strength & Muscle Tone: within normal limits Gait & Station: normal Patient leans: N/A  Psychiatric Specialty Exam: Physical Exam  Nursing note and vitals reviewed. Constitutional: She is oriented to person, place, and time. She appears well-developed.  Cardiovascular:  Elevated pulse rate: 108. Patient is currently in no apparent distress.  Respiratory: No respiratory distress. She has no wheezes. She exhibits no tenderness.  Genitourinary:    Genitourinary Comments: Deferred   Musculoskeletal:        General: Normal range of  motion.     Cervical back: Normal range of motion.  Neurological: She is alert and oriented to person, place, and time.  Skin: Skin is warm and dry.    Review of Systems  Constitutional: Negative for chills, diaphoresis and fever.  HENT: Negative for congestion, rhinorrhea, sneezing and sore throat.   Eyes: Negative for discharge.  Respiratory: Negative for cough, chest tightness, shortness of breath and wheezing.   Cardiovascular: Negative for chest pain and palpitations.  Gastrointestinal: Negative for diarrhea, nausea and vomiting.  Endocrine: Negative for cold intolerance.  Genitourinary: Negative for difficulty urinating.  Musculoskeletal: Negative for arthralgias and myalgias.  Allergic/Immunologic: Positive for environmental allergies (Latex) and food allergies (Lactose intolerance).       Allergies: Oxycodone  Neurological: Negative for dizziness, tremors, seizures, syncope, light-headedness and headaches.  Psychiatric/Behavioral: Positive for dysphoric  mood, sleep disturbance and suicidal ideas (Hx. of). Negative for agitation, behavioral problems, confusion, decreased concentration, hallucinations and self-injury. The patient is nervous/anxious. The patient is not hyperactive.     Blood pressure 126/74, pulse (!) 108, temperature 98.3 F (36.8 C), temperature source Oral, resp. rate 18, height 5\' 1"  (1.549 m), weight 91.2 kg, SpO2 99 %.Body mass index is 37.98 kg/m.  General Appearance: Casual  Eye Contact:  Good  Speech:  Normal Rate  Volume:  Normal  Mood:  Anxious and Depressed  Affect:  Congruent  Thought Process:  Coherent and Descriptions of Associations: Intact  Orientation:  Full (Time, Place, and Person)  Thought Content:  Logical  Suicidal Thoughts:  Yes.  without intent/plan  Homicidal Thoughts:  No  Memory:  Immediate;   Fair Recent;   Fair Remote;   Fair  Judgement:  Intact  Insight:  Fair  Psychomotor Activity:  Increased  Concentration:  Concentration:  Good and Attention Span: Good  Recall:  Good  Fund of Knowledge:  Good  Language:  Good  Akathisia:  Negative  Handed:  Right  AIMS (if indicated):     Assets:  Desire for Improvement Resilience  ADL's:  Intact  Cognition:  WNL    Sleep:  Number of Hours: 3.25   Treatment Plan Summary: Daily contact with patient to assess and evaluate symptoms and progress in treatment and Medication management   Treatment Plan/Recommendations:  1. Admit for crisis management and stabilization, estimated length of stay 3-5 days.    2. Medication management to reduce current symptoms to base line and improve the patient's overall level of functioning: See MAR, Md's SRA & treatment plan.   Observation Level/Precautions:  15 minute checks  Laboratory:  Reviewed  Psychotherapy:  Group therapy  Medications:  See MAR  Consultations:  PRN  Discharge Concerns: Safety, mood stability  Estimated LOS: 3-5 days  Other: Admit to the 300-hall   Physician Treatment Plan for Primary Diagnosis: Bipolar II disorder (HCC)  Long Term Goal(s): Improvement in symptoms so as ready for discharge  Short Term Goals: Ability to verbalize feelings will improve, Ability to disclose and discuss suicidal ideas and Ability to demonstrate self-control will improve  Physician Treatment Plan for Secondary Diagnosis: Principal Problem:   Bipolar II disorder (HCC)  Long Term Goal(s): Improvement in symptoms so as ready for discharge  Short Term Goals: Ability to demonstrate self-control will improve and Ability to identify and develop effective coping behaviors will improve  I certify that inpatient services furnished can reasonably be expected to improve the patient's condition.    , NP, PMHNP, FNP-BC 3/27/202112:45 PM

## 2019-11-27 NOTE — Progress Notes (Signed)
Adult Psychoeducational Group Note  Date:  11/27/2019 Time:  9:14 PM  Group Topic/Focus:  Wrap-Up Group:   The focus of this group is to help patients review their daily goal of treatment and discuss progress on daily workbooks.  Participation Level:  Active  Participation Quality:  Appropriate  Affect:  Appropriate  Cognitive:  Alert and Appropriate  Insight: Improving  Engagement in Group:  Engaged  Modes of Intervention:  Activity  Additional Comments:  Pt attended and engaged in wrap up group. Pt goal for today was to start new medication. She reports improving relationship with family and support system. Pt reports no thoughts of wanting to hurt self or others. Pt reports that she would like to discuss lab results. Pt rated her day a 5/10.   Martrell Eguia Brayton Mars 11/27/2019, 9:14 PM

## 2019-11-27 NOTE — BHH Suicide Risk Assessment (Signed)
[facility] Admission Suicide Risk Assessment   Nursing information obtained from:  Patient, Review of record Demographic factors:  Adolescent or young adult Current Mental Status:  Suicidal ideation indicated by patient Loss Factors:  NA Historical Factors:  Impulsivity Risk Reduction Factors:  Positive social support, Employed, Positive therapeutic relationship, Sense of responsibility to family, Living with another person, especially a relative, Positive coping skills or problem solving skills  Total Time spent with patient: 30 minutes Principal Problem: <principal problem not specified> Diagnosis:  Active Problems:   Bipolar II disorder ([place])  Subjective Data: Patient is seen and examined.  Patient is a 24 year old female with a past psychiatric history significant for posttraumatic stress disorder and bipolar disorder type II who presented as a walk-in to the behavioral health hospital on 11/26/2019 with worsening depression.  The patient stated that her medications had stopped working.  She stated that she had become increasingly more depressed, having anhedonia, isolating, feelings of worthlessness and guilt.  She is followed by Dr. Darleene Cleaver as an outpatient, but had not seen him in a while.  The patient had been recently admitted to the hospital here in January 2021.  Her medications on discharge included Lamictal, Abilify and Wellbutrin.  After that hospitalization she was in the intensive outpatient program/partial hospital program.  She continued in that until October 06, 2019.  She stated that she had been on several antidepressants in the past, and had side effects or problems with it.  It sounds like they had attempted to increase her Wellbutrin, but "it made me worse".  She stated she felt as though that the Lamictal was beneficial, and wanted to make sure we would continue that.  She was admitted to the hospital for evaluation and stabilization.  Continued Clinical Symptoms:  Alcohol Use  Disorder Identification Test Final Score (AUDIT): 0 The "Alcohol Use Disorders Identification Test", Guidelines for Use in Primary Care, Second Edition.  [facility]). Score between 0-7:  no or low risk or alcohol related problems. Score between 8-15:  moderate risk of alcohol related problems. Score between 16-19:  high risk of alcohol related problems. Score 20 or above:  warrants further diagnostic evaluation for alcohol dependence and treatment.   CLINICAL FACTORS:   Bipolar Disorder:   Bipolar II Depression:   Anhedonia Hopelessness Impulsivity Insomnia More than one psychiatric diagnosis   Musculoskeletal: Strength & Muscle Tone: within normal limits Gait & Station: normal Patient leans: N/A  Psychiatric Specialty Exam: Physical Exam  Nursing note and vitals reviewed. Constitutional: She is oriented to person, place, and time. She appears well-developed and well-nourished.  HENT:  Head: Normocephalic and atraumatic.  Respiratory: Effort normal.  Neurological: She is alert and oriented to person, place, and time.    Review of Systems  Blood pressure 126/74, pulse (!) 108, temperature 98.3 F (36.8 C), temperature source Oral, resp. rate 18, height 5\' 1"  (1.549 m), weight 91.2 kg, SpO2 99 %.Body mass index is 37.98 kg/m.  General Appearance: Casual  Eye Contact:  Good  Speech:  Normal Rate  Volume:  Normal  Mood:  Anxious and Depressed  Affect:  Congruent  Thought Process:  Coherent and Descriptions of Associations: Intact  Orientation:  Full (Time, Place, and Person)  Thought Content:  Logical  Suicidal Thoughts:  Yes.  without intent/plan  Homicidal Thoughts:  No  Memory:  Immediate;   Fair Recent;   Fair Remote;   Fair  Judgement:  Intact  Insight:  Fair  Psychomotor Activity:  Increased  Concentration:  Concentration: Good and Attention Span: Good  Recall:  Good  Fund of Knowledge:  Good  Language:  Good  Akathisia:  Negative   Handed:  Right  AIMS (if indicated):     Assets:  Desire for Improvement Resilience  ADL's:  Intact  Cognition:  WNL  Sleep:  Number of Hours: 3.25      COGNITIVE FEATURES THAT CONTRIBUTE TO RISK:  None    SUICIDE RISK:   Mild:  Suicidal ideation of limited frequency, intensity, duration, and specificity.  There are no identifiable plans, no associated intent, mild dysphoria and related symptoms, good self-control (both objective and subjective assessment), few other risk factors, and identifiable protective factors, including available and accessible social support.  PLAN OF CARE: Patient is seen and examined.  Patient is a 24 year old female with the above-stated past psychiatric history who was admitted for worsening depression, suicidal ideation.  She will be admitted to the hospital.  She will be integrated into milieu.  She will be encouraged to attend groups.  We will continue her Abilify, Lamictal and trazodone.  She will continue to have hydroxyzine as needed for anxiety.  We will stop the Wellbutrin, and start Cymbalta 20 mg p.o. daily.  We will monitor for worsening mood symptoms including mania induced by the serotonin drugs.  She also has a history of irritable bowel syndrome, and I will add dicyclomine 10 mg p.o. 3 times daily and at bedtime.  We will also readd back famotidine 20 mg p.o. twice daily.  She does not want to be on Protonix at least at this point.  Review of her laboratories revealed a mildly increased creatinine at 1.12, but otherwise negative electrolytes.  Her white blood cell count was mildly elevated on admission at 13.2.  Her platelets were mildly elevated at 4 and 62,000.  Blood alcohol was not done, drug screen was negative.  I certify that inpatient services furnished can reasonably be expected to improve the patient's condition.   Antonieta Pert, MD 11/27/2019, 9:55 AM

## 2019-11-27 NOTE — Progress Notes (Signed)
Pt reports passive SI, but does not have a plan.  Pt contracts for safety on the unit.  She rated her depression at a 6 out of 10 and anxiety as an 8 out of 10.  RN administered medications per provider orders and pt tolerated medications without any adverse reactions.  RN provided support and reassurance.  RN will continue to monitor and provide assistance as needed.

## 2019-11-28 NOTE — Progress Notes (Signed)
   11/28/19 1000  Psych Admission Type (Psych Patients Only)  Admission Status Voluntary  Psychosocial Assessment  Patient Complaints Depression  Eye Contact Fair  Facial Expression Flat  Affect Depressed  Speech Logical/coherent  Interaction Cautious  Motor Activity Other (Comment) (wdl)  Appearance/Hygiene Unremarkable  Behavior Characteristics Calm;Cooperative  Mood Sad  Thought Process  Coherency WDL  Content WDL  Delusions WDL  Perception WDL  Hallucination None reported or observed  Judgment Impaired  Confusion None  Danger to Self  Current suicidal ideation? Denies  Self-Injurious Behavior No self-injurious ideation or behavior indicators observed or expressed   Agreement Not to Harm Self Yes  Description of Agreement Verbal  Danger to Others  Danger to Others None reported or observed  Pt alert and oriented x 4 denies SI/HI at present contracted for safety. Medication compliant. Will continue to monitor.

## 2019-11-28 NOTE — BHH Group Notes (Signed)
[facility] LCSW Group Therapy Note  Date/Time:  11/28/2019 10:00a  Type of Therapy and Topic:  Group Therapy:  Healthy and Unhealthy Supports  Participation Level:  Active   Description of Group:  Patients in this group were introduced to the idea of adding a variety of healthy supports to address the various needs in their lives.Patients discussed what additional healthy supports could be helpful in their recovery and wellness after discharge in order to prevent future hospitalizations.   An emphasis was placed on using counselor, doctor, therapy groups, 12-step groups, and problem-specific support groups to expand supports.  They also worked as a group on developing a specific plan for several patients to deal with unhealthy supports through boundary-setting, psychoeducation with loved ones, and even termination of relationships.   Therapeutic Goals:   1)  discuss importance of adding supports to stay well once out of the hospital  2)  compare healthy versus unhealthy supports and identify some examples of each  3)  generate ideas and descriptions of healthy supports that can be added  4)  offer mutual support about how to address unhealthy supports  5)  encourage active participation in and adherence to discharge plan    Summary of Patient Progress:  The patient actively engaged in introductory check-in, detailing of feeling "very tired", further sharing of beliefs that medication adjustments are the cause. Pt further engaged in group discussion, defining support as being "to hold something up", further sharing of relationships and how they contribute to being supportive. Pt stated that current healthy supports in her life are select friends while not specifically detailing any current unhealthy supports.  The patient expressed a willingness to deepen bonds with the people already included in her support system to help in her recovery journey. Pt actively participated in further  discussion of how one can effectively distance ones self from unhealthy supports and proved to acknowledge alternate group members input. Pt proved receptive to alternate group members input on all topics of discussion as well as receptive to feedback from group facilitator.   Therapeutic Modalities:   Motivational Interviewing Brief Solution-Focused Therapy  Leisa Lenz, LCSWA 11/28/2019  1:46 PM
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
## 2019-11-28 NOTE — Progress Notes (Signed)
   11/28/19 2159  COVID-19 Daily Checkoff  Have you had a fever (temp > 37.80C/100F)  in the past 24 hours?  No  If you have had runny nose, nasal congestion, sneezing in the past 24 hours, has it worsened? No  COVID-19 EXPOSURE  Have you traveled outside the state in the past 14 days? No  Have you been in contact with someone with a confirmed diagnosis of COVID-19 or PUI in the past 14 days without wearing appropriate PPE? No  Have you been living in the same home as a person with confirmed diagnosis of COVID-19 or a PUI (household contact)? No  Have you been diagnosed with COVID-19? No

## 2019-11-28 NOTE — Progress Notes (Signed)
DAR NOTE: Pt present with bright  affect and calm mood in the unit. Pt  Pt denies physical pain, took all her meds as scheduled. Pt's safety ensured with 15 minute and environmental checks. Pt  Denied SI/HI and A/V hallucinations. Pt verbally agrees to seek staff if SI/HI or A/VH occurs and to consult with staff before acting on these thoughts. Pt slept well with even respiration, no issues observed or reported. Will continue POC.

## 2019-11-28 NOTE — Progress Notes (Signed)
[facility] MD Progress Note  11/28/2019 12:54 PM Chelsea Hoover  MRN:  [id number]  Subjective: Chelsea Hoover reports, "I feel like I'm in a good mood today, just feeling sleepy. I slept a lot last night too".  Objective: Patient is a 24 year old female with a past psychiatric history significant for posttraumatic stress disorder and bipolar disorder type II who presented as a walk-in to the behavioral health hospital on 11/26/2019 with worsening depression. The patient stated that her medications had stopped working. She stated that she had become increasingly more depressed, having anhedonia, isolating, feelings of worthlessness and guilt. She is followed by Dr. Jannifer Franklin as an outpatient, but had not seen him in a while. The patient had been recently admitted to the hospital here in January 2021. Her medications on discharge included Lamictal, Abilify and Wellbutrin.  Chelsea Hoover is seen, chart reviewed. The chart findings discussed with the treatment team. She presents alert, oriented x 4. She is visible on the unit, attending & participating in the group sessions. She reports feeling like she is in a good mood. However, complained of feeling sleepy as well. She understand this may be coming from her medications. She is willing to give her body time to adjust to her medicines. She currently denies any SIHI, AVH, delusional thoughts or paranoia. She does not appear to be responding to any internal stimuli. Helmut Muster is in agreement to continue her current plan of care as already in progress.  Principal Problem: Bipolar II disorder (HCC)  Diagnosis: Principal Problem:   Bipolar II disorder (HCC)  Total Time spent with patient: 25 minutes  Past Psychiatric History: Bipolar disorder.  Past Medical History:  Past Medical History:  Diagnosis Date  . Anxiety   . 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 Bipolar 1 disorder (HCC)   . Cannabis abuse 09/03/2015  . Depression   . Irritable bowel syndrome   . Lyme  disease   . Mood disorder (HCC)   . PTSD (post-traumatic stress disorder) 09/01/2015    Past Surgical History:  Procedure Laterality Date  . TONSILECTOMY/ADENOIDECTOMY WITH MYRINGOTOMY Bilateral    August 2020   Family History:  Family History  Problem Relation Age of Onset  . Crohn's disease Father   . Bipolar disorder Maternal Aunt   . Schizophrenia Maternal Grandfather   . Bipolar disorder Maternal Grandfather    Family Psychiatric  History: See H&P. Social History:  Social History   Substance and Sexual Activity  Alcohol Use No     Social History   Substance and Sexual Activity  Drug Use Yes  . Types: Marijuana   Comment: Few times a week    Social History   Socioeconomic History  . Marital status: Single    Spouse name: Not on file  . Number of children: Not on file  . Years of education: Not on file  . Highest education level: Not on file  Occupational History  . Not on file  Tobacco Use  . Smoking status: Never Smoker  . Smokeless tobacco: Never Used  Substance and Sexual Activity  . Alcohol use: No  . Drug use: Yes    Types: Marijuana    Comment: Few times a week  . Sexual activity: Yes    Birth control/protection: Pill  Other Topics Concern  . Not on file  Social History Narrative  . Not on file   Social Determinants of Health   Financial Resource Strain:   . Difficulty of Paying Living Expenses:   Food Insecurity:   .  Worried About Programme researcher, broadcasting/film/videounning Out of Food in the Last Year:   . Baristaan Out of Food in the Last Year:   Transportation Needs:   . Freight forwarderLack of Transportation (Medical):   Marland Kitchen. Lack of Transportation (Non-Medical):   Physical Activity:   . Days of Exercise per Week:   . Minutes of Exercise per Session:   Stress:   . Feeling of Stress :   Social Connections:   . Frequency of Communication with Friends and Family:   . Frequency of Social Gatherings with Friends and Family:   . Attends Religious Services:   . Active Member of Clubs or  Organizations:   . Attends BankerClub or Organization Meetings:   Marland Kitchen. Marital Status:    Additional Social History:    Pain Medications: None Prescriptions: Welbutrin, Lamictal, Trazadone, Amogadone Over the Counter: Stomach acid reducer (hx of IBS) History of alcohol / drug use?: Yes Longest period of sobriety (when/how long): None  Name of Substance 1: alcohol 1 - Frequency: 2-3x weekly 1 - Duration: ongoing Name of Substance 2: THC 2 - Age of First Use: 24 years of age 43 - Amount (size/oz): One bowl 2 - Frequency: Nightly 2 - Duration: ongoing 2 - Last Use / Amount: 09/04/19  Sleep: Good  Appetite:  Good  Current Medications: Current Facility-Administered Medications  Medication Dose Route Frequency Provider Last Rate Last Admin  . acetaminophen (TYLENOL) tablet 650 mg  650 mg Oral Q6H PRN Nira ConnBerry, Jason A, NP      . alum & mag hydroxide-simeth (MAALOX/MYLANTA) 200-200-20 MG/5ML suspension 30 mL  30 mL Oral Q4H PRN Nira ConnBerry, Jason A, NP   30 mL at 11/27/19 0133  . ARIPiprazole (ABILIFY) tablet 10 mg  10 mg Oral Daily Antonieta Pertlary, Greg Lawson, MD   10 mg at 11/28/19 [id number]  . dicyclomine (BENTYL) capsule 10 mg  10 mg Oral TID AC & HS Antonieta Pertlary, Greg Lawson, MD   10 mg at 11/28/19 1119  . DULoxetine (CYMBALTA) DR capsule 20 mg  20 mg Oral Daily Antonieta Pertlary, Greg Lawson, MD   20 mg at 11/28/19 [id number]  . famotidine (PEPCID) tablet 20 mg  20 mg Oral BID Antonieta Pertlary, Greg Lawson, MD   20 mg at 11/28/19 [id number]  . hydrOXYzine (ATARAX/VISTARIL) tablet 25 mg  25 mg Oral TID PRN Jackelyn PolingBerry, Jason A, NP      . lamoTRIgine (LAMICTAL) tablet 100 mg  100 mg Oral BID Antonieta Pertlary, Greg Lawson, MD   100 mg at 11/28/19 [id number]  . magnesium hydroxide (MILK OF MAGNESIA) suspension 30 mL  30 mL Oral Daily PRN Nira ConnBerry, Jason A, NP      . traZODone (DESYREL) tablet 75 mg  75 mg Oral QHS Antonieta Pertlary, Greg Lawson, MD   75 mg at 11/27/19 2106    Lab Results:  Results for orders placed or performed during the hospital encounter of 11/26/19 (from the past 48  hour(s))  Respiratory Panel by RT PCR (Flu A&B, Covid) - Nasopharyngeal Swab     Status: None   Collection Time: 11/26/19  6:39 PM   Specimen: Nasopharyngeal Swab  Result Value Ref Range   SARS Coronavirus 2 by RT PCR NEGATIVE NEGATIVE    Comment: (NOTE) SARS-CoV-2 target nucleic acids are NOT DETECTED. The SARS-CoV-2 RNA is generally detectable in upper respiratoy specimens during the acute phase of infection. The lowest concentration of SARS-CoV-2 viral copies this assay can detect is 131 copies/mL. A negative result does not preclude SARS-Cov-2 infection and should not be used as  the sole basis for treatment or other patient management decisions. A negative result may occur with  improper specimen collection/handling, submission of specimen other than nasopharyngeal swab, presence of viral mutation(s) within the areas targeted by this assay, and inadequate number of viral copies (<131 copies/mL). A negative result must be combined with clinical observations, patient history, and epidemiological information. The expected result is Negative. Fact Sheet for Patients:  https://www.moore.com/ Fact Sheet for Healthcare Providers:  https://www.young.biz/ This test is not yet ap proved or cleared by the [place] FDA and  has been authorized for detection and/or diagnosis of SARS-CoV-2 by FDA under an Emergency Use Authorization (EUA). This EUA will remain  in effect (meaning this test can be used) for the duration of the COVID-19 declaration under Section 564(b)(1) of the Act, 21 U.S.C. section 360bbb-3(b)(1), unless the authorization is terminated or revoked sooner.    Influenza A by PCR NEGATIVE NEGATIVE   Influenza B by PCR NEGATIVE NEGATIVE    Comment: (NOTE) The Xpert Xpress SARS-CoV-2/FLU/RSV assay is intended as an aid in  the diagnosis of influenza from Nasopharyngeal swab specimens and  should not be used as a sole basis for  treatment. Nasal washings and  aspirates are unacceptable for Xpert Xpress SARS-CoV-2/FLU/RSV  testing. Fact Sheet for Patients: https://www.moore.com/ Fact Sheet for Healthcare Providers: https://www.young.biz/ This test is not yet approved or cleared by the [place] FDA and  has been authorized for detection and/or diagnosis of SARS-CoV-2 by  FDA under an Emergency Use Authorization (EUA). This EUA will remain  in effect (meaning this test can be used) for the duration of the  Covid-19 declaration under Section 564(b)(1) of the Act, 21  U.S.C. section 360bbb-3(b)(1), unless the authorization is  terminated or revoked. Performed at [facility], [address] [place], [place] [address]   Pregnancy, urine     Status: None   Collection Time: 11/27/19  1:03 AM  Result Value Ref Range   Preg Test, Ur NEGATIVE NEGATIVE    Comment:        THE SENSITIVITY OF THIS METHODOLOGY IS >20 mIU/mL. Performed at [facility], [address] [place], [place] [address]   Urine rapid drug screen (hosp performed)not at [facility]     Status: None   Collection Time: 11/27/19  1:03 AM  Result Value Ref Range   Opiates NONE DETECTED NONE DETECTED   Cocaine NONE DETECTED NONE DETECTED   Benzodiazepines NONE DETECTED NONE DETECTED   Amphetamines NONE DETECTED NONE DETECTED   Tetrahydrocannabinol NONE DETECTED NONE DETECTED   Barbiturates NONE DETECTED NONE DETECTED    Comment: (NOTE) DRUG SCREEN FOR MEDICAL PURPOSES ONLY.  IF CONFIRMATION IS NEEDED FOR ANY PURPOSE, NOTIFY LAB WITHIN 5 DAYS. LOWEST DETECTABLE LIMITS FOR URINE DRUG SCREEN Drug Class                     Cutoff (ng/mL) Amphetamine and metabolites    1000 Barbiturate and metabolites    200 Benzodiazepine                 200 Tricyclics and metabolites     300 Opiates and metabolites        300 Cocaine and metabolites        300 THC                             50 Performed at [facility],  [place] [address] [place], [place] [address]   CBC     Status: Abnormal   Collection Time: 11/27/19  7:03 AM  Result Value Ref Range   WBC 13.2 (H) 4.0 - 10.5 K/uL   RBC 4.97 3.87 - 5.11 MIL/uL   Hemoglobin 12.2 12.0 - 15.0 g/dL   HCT 39.8 36.0 - 46.0 %   MCV 80.1 80.0 - 100.0 fL   MCH 24.5 (L) 26.0 - 34.0 pg   MCHC 30.7 30.0 - 36.0 g/dL   RDW 17.2 (H) 11.5 - 15.5 %   Platelets 462 (H) 150 - 400 K/uL   nRBC 0.0 0.0 - 0.2 %    Comment: Performed at [facility], [place] [address], [place], [place] [address]  Comprehensive metabolic panel     Status: Abnormal   Collection Time: 11/27/19  7:03 AM  Result Value Ref Range   Sodium 139 135 - 145 mmol/L   Potassium 3.4 (L) 3.5 - 5.1 mmol/L   Chloride 103 98 - 111 mmol/L   CO2 25 22 - 32 mmol/L   Glucose, Bld 90 70 - 99 mg/dL    Comment: Glucose reference range applies only to samples taken after fasting for at least 8 hours.   BUN 15 6 - 20 mg/dL   Creatinine, Ser 1.12 (H) 0.44 - 1.00 mg/dL   Calcium 9.6 8.9 - 10.3 mg/dL   Total Protein 8.1 6.5 - 8.1 g/dL   Albumin 4.8 3.5 - 5.0 g/dL   AST 17 15 - 41 U/L   ALT 17 0 - 44 U/L   Alkaline Phosphatase 74 38 - 126 U/L   Total Bilirubin 0.7 0.3 - 1.2 mg/dL   GFR calc non Af Amer >60 >60 mL/min   GFR calc Af Amer >60 >60 mL/min   Anion gap 11 5 - 15    Comment: Performed at [facility], [place] [address], [place], [place] [address]    Blood Alcohol level:  Lab Results  Component Value Date   [facility] <11 24/40/1027    Metabolic Disorder Labs: Lab Results  Component Value Date   HGBA1C 5.3 09/07/2019   MPG 105 09/07/2019   MPG 120 09/01/2015   No results found for: PROLACTIN Lab Results  Component Value Date   CHOL 166 09/07/2019   TRIG 72 09/07/2019   HDL 63 09/07/2019   CHOLHDL 2.6 09/07/2019   VLDL 14 09/07/2019   LDLCALC 89 09/07/2019   LDLCALC 62 09/01/2015    Physical  Findings: AIMS: Facial and Oral Movements Muscles of Facial Expression: None, normal Lips and Perioral Area: None, normal Jaw: None, normal Tongue: None, normal,Extremity Movements Upper (arms, wrists, hands, fingers): None, normal Lower (legs, knees, ankles, toes): None, normal, Trunk Movements Neck, shoulders, hips: None, normal, Overall Severity Severity of abnormal movements (highest score from questions above): None, normal Incapacitation due to abnormal movements: None, normal Patient's awareness of abnormal movements (rate only patient's report): No Awareness, Dental Status Current problems with teeth and/or dentures?: No Does patient usually wear dentures?: No  CIWA:  CIWA-Ar Total: 0 COWS:  COWS Total Score: 2  Musculoskeletal: Strength & Muscle Tone: within normal limits Gait & Station: normal Patient leans: N/A  Psychiatric Specialty Exam: Physical Exam  Nursing note and vitals reviewed. Constitutional: She is oriented to person, place, and time. She appears well-developed.  Cardiovascular: Normal rate.  Genitourinary:    Genitourinary Comments: Deferred   Musculoskeletal:        General:  Normal range of motion.     Cervical back: Normal range of motion.  Neurological: She is alert and oriented to person, place, and time.  Skin: Skin is warm and dry.    Review of Systems  Constitutional: Negative for chills, diaphoresis and fever.  HENT: Negative for congestion, rhinorrhea, sneezing and sore throat.   Respiratory: Negative for cough, chest tightness and shortness of breath.   Cardiovascular: Negative for chest pain and palpitations.  Gastrointestinal: Negative for diarrhea, nausea and vomiting.  Endocrine: Negative for cold intolerance.  Genitourinary: Negative for difficulty urinating.  Musculoskeletal: Negative.   Skin: Negative.   Allergic/Immunologic: Positive for environmental allergies (Latex) and food allergies (Lactose intolerance).       Allergies:  Hydrocodone  Neurological: Negative.   Hematological: Negative.   Psychiatric/Behavioral: Positive for dysphoric mood ("Improving"). Negative for agitation, behavioral problems, confusion, decreased concentration, hallucinations, self-injury, sleep disturbance and suicidal ideas. The patient is not nervous/anxious and is not hyperactive.     Blood pressure 116/66, pulse 99, temperature 98.9 F (37.2 C), resp. rate 16, height 5\' 1"  (1.549 m), weight 91.2 kg, SpO2 100 %.Body mass index is 37.98 kg/m.  General Appearance:Casual  Eye Contact:Good  Speech:Normal Rate  Volume:Normal  Mood:"Improving"  Affect:Congruent  Thought Process:Coherent and Descriptions of Associations:Intact  Orientation:Full (Time, Place, and Person)  Thought Content:Logical  Suicidal Thoughts:Denies  Homicidal Thoughts:No  Memory:Immediate;Fair Recent;Fair Remote;Fair  Judgement:Intact  Insight:Fair  Psychomotor Activity:Increased  Concentration:Concentration:Goodand Attention Span: Good  Recall:Good  Fund of Knowledge:Good  Language:Good  Akathisia:Negative  Handed:Right  AIMS (if indicated):   Assets:Desire for Improvement Resilience  ADL's:Intact  Cognition:WNL    Sleep:  Number of Hours: 3.25   Treatment Plan Summary: Daily contact with patient to assess and evaluate symptoms and progress in treatment and Medication management.  -Continue inpatient hospitalization.  -Will continue today 11/28/2019 plan as below except where it is noted.  -Mood control/stabilization  -Continue Abilify 10 mg po daily.   -Continue Lamictal 100 mg po daily.  -Anxiety  -Continue atarax 25 mg po q8h prn anxiety  -Insomnia  -Continue Trazodone 75 mg po q hs.  Other medical complaints.  -Continue Pepcid 20 mg po bid for GERD.   -Continue Bentyl 10 mg po for abdominal cramps.  -Encourage participation in groups and therapeutic milieu  -Disposition  planning will be ongoing  11/30/2019, NP, PMHNP, FNP-BC 11/28/2019, 12:54 PM

## 2019-11-28 NOTE — Progress Notes (Signed)
   11/28/19 2201  Psych Admission Type (Psych Patients Only)  Admission Status Voluntary  Psychosocial Assessment  Patient Complaints Depression  Eye Contact Fair  Facial Expression Flat  Affect Appropriate to circumstance  Speech Logical/coherent  Interaction Cautious  Motor Activity Other (Comment) (WDL)  Appearance/Hygiene Unremarkable  Behavior Characteristics Appropriate to situation  Mood Pleasant  Thought Process  Coherency WDL  Content WDL  Delusions None reported or observed  Perception WDL  Hallucination None reported or observed  Judgment Impaired  Confusion None  Danger to Self  Current suicidal ideation? Denies  Self-Injurious Behavior No self-injurious ideation or behavior indicators observed or expressed   Agreement Not to Harm Self Yes  Description of Agreement Verbal  Danger to Others  Danger to Others None reported or observed

## 2019-11-29 MED ORDER — FAMOTIDINE 20 MG PO TABS: 20.0000 mg | ORAL_TABLET | Freq: Two times a day (BID) | ORAL | 0 refills | Status: DC

## 2019-11-29 MED ORDER — TRAZODONE HCL 150 MG PO TABS: 75.0000 mg | ORAL_TABLET | Freq: Every day | ORAL | 0 refills | Status: AC

## 2019-11-29 MED ORDER — LAMOTRIGINE 100 MG PO TABS: 100.0000 mg | ORAL_TABLET | Freq: Two times a day (BID) | ORAL | 0 refills | Status: AC

## 2019-11-29 MED ORDER — DULOXETINE HCL 20 MG PO CPEP: 20.0000 mg | ORAL_CAPSULE | Freq: Every day | ORAL | 0 refills | Status: DC

## 2019-11-29 MED ORDER — ARIPIPRAZOLE 10 MG PO TABS: 10.0000 mg | ORAL_TABLET | Freq: Every day | ORAL | 0 refills | Status: DC

## 2019-11-29 NOTE — Progress Notes (Signed)
  [facility] Adult Case Management Discharge Plan :  Will you be returning to the same living situation after discharge:  Yes,  home. At discharge, do you have transportation home?: Yes,  car is here. Do you have the ability to pay for your medications: Yes,  BCBS insurance.  Release of information consent forms completed and in the chart;  Patient's signature needed at discharge.  Patient to Follow up at: Follow-up Information    Chelsea Mins, MD Follow up on 12/07/2019.   Specialty: Psychiatry Why: You are scheduled for an appointment on 12/07/19 at 1:30 pm.  This will be a virtual tele-health appointment.  Contact information: [address] [place] [address] [phone]        Chelsea Hoover. Schedule an appointment as soon as possible for a visit.   Why: Please contact this provider for therapy services. Contact information: [facility] [employer], [facility] [address] [place], [place] [address] P: [phone] F: [phone]          Next level of care provider has access to [facility] Link:no  Safety Planning and Suicide Prevention discussed: Yes,  with patient. One attempt to reach mother.  Has patient been referred to the Quitline?: N/A patient is not a smoker  Patient has been referred for addiction treatment: Yes  Chelsea Hoover, LCSWA 11/29/2019, 1:34 PM

## 2019-11-29 NOTE — Tx Team (Addendum)
Interdisciplinary Treatment and Diagnostic Plan Update  11/29/2019 Time of Session: 9:10am Chelsea Hoover MRN: [id number]  Principal Diagnosis: Bipolar II disorder ([place])  Secondary Diagnoses: Principal Problem:   Bipolar II disorder ([place])   Current Medications:  Current Facility-Administered Medications  Medication Dose Route Frequency Provider Last Rate Last Admin  . acetaminophen (TYLENOL) tablet 650 mg  650 mg Oral Q6H PRN Lindon Romp A, NP      . alum & mag hydroxide-simeth (MAALOX/MYLANTA) 200-200-20 MG/5ML suspension 30 mL  30 mL Oral Q4H PRN Lindon Romp A, NP   30 mL at 11/27/19 0133  . ARIPiprazole (ABILIFY) tablet 10 mg  10 mg Oral Daily Sharma Covert, MD   10 mg at 11/29/19 5956  . dicyclomine (BENTYL) capsule 10 mg  10 mg Oral TID AC & HS Sharma Covert, MD   10 mg at 11/29/19 3875  . DULoxetine (CYMBALTA) DR capsule 20 mg  20 mg Oral Daily Sharma Covert, MD   20 mg at 11/29/19 0820  . famotidine (PEPCID) tablet 20 mg  20 mg Oral BID Sharma Covert, MD   20 mg at 11/29/19 6433  . hydrOXYzine (ATARAX/VISTARIL) tablet 25 mg  25 mg Oral TID PRN Rozetta Nunnery, NP      . lamoTRIgine (LAMICTAL) tablet 100 mg  100 mg Oral BID Sharma Covert, MD   100 mg at 11/29/19 2951  . magnesium hydroxide (MILK OF MAGNESIA) suspension 30 mL  30 mL Oral Daily PRN Lindon Romp A, NP      . traZODone (DESYREL) tablet 75 mg  75 mg Oral QHS Sharma Covert, MD   75 mg at 11/28/19 2129   PTA Medications: Medications Prior to Admission  Medication Sig Dispense Refill Last Dose  . b complex vitamins tablet Take 1 tablet by mouth daily.     . cholecalciferol (VITAMIN D3) 25 MCG (1000 UNIT) tablet Take 1,000 Units by mouth daily.     . Multiple Vitamin (MULTIVITAMIN WITH MINERALS) TABS tablet Take 1 tablet by mouth daily.     . ARIPiprazole (ABILIFY) 10 MG tablet Take 1 tablet (10 mg total) by mouth daily. 30 tablet 0 Unknown at Unknown time  . buPROPion (WELLBUTRIN XL) 150  MG 24 hr tablet Take 1 tablet (150 mg total) by mouth daily. 30 tablet 0 Unknown at Unknown time  . famotidine (PEPCID) 20 MG tablet Take 1 tablet (20 mg total) by mouth 2 (two) times daily. 60 tablet 0 Unknown at Unknown time  . hydrOXYzine (ATARAX/VISTARIL) 25 MG tablet Take 1 tablet by mouth 3 (three) times daily as needed.     . lamoTRIgine (LAMICTAL) 150 MG tablet Take 1 tablet (150 mg total) by mouth 2 (two) times daily. 60 tablet 0 Unknown at Unknown time  . Multiple Vitamin (MULTIVITAMIN WITH MINERALS) TABS tablet Take 1 tablet by mouth daily.   Unknown at Unknown time  . traZODone (DESYREL) 50 MG tablet Take 1.5 tablets (75 mg total) by mouth at bedtime as needed for sleep. 60 tablet 0 Unknown at Unknown time    Patient Stressors: Other: Reports medications not working and her job is stressful  Patient Strengths: Ability for insight Average or above average intelligence Capable of independent living Agricultural engineer for treatment/growth Physical Health Supportive family/friends  Treatment Modalities: Medication Management, Group therapy, Case management,  1 to 1 session with clinician, Psychoeducation, Recreational therapy.   Physician Treatment Plan for Primary Diagnosis: Bipolar II disorder ([place]) Long Term  Goal(s): Improvement in symptoms so as ready for discharge Improvement in symptoms so as ready for discharge   Short Term Goals: Ability to verbalize feelings will improve Ability to disclose and discuss suicidal ideas Ability to demonstrate self-control will improve Ability to demonstrate self-control will improve Ability to identify and develop effective coping behaviors will improve  Medication Management: Evaluate patient's response, side effects, and tolerance of medication regimen.  Therapeutic Interventions: 1 to 1 sessions, Unit Group sessions and Medication administration.  Evaluation of Outcomes: Adequate for Discharge  Physician Treatment  Plan for Secondary Diagnosis: Principal Problem:   Bipolar II disorder (HCC)  Long Term Goal(s): Improvement in symptoms so as ready for discharge Improvement in symptoms so as ready for discharge   Short Term Goals: Ability to verbalize feelings will improve Ability to disclose and discuss suicidal ideas Ability to demonstrate self-control will improve Ability to demonstrate self-control will improve Ability to identify and develop effective coping behaviors will improve     Medication Management: Evaluate patient's response, side effects, and tolerance of medication regimen.  Therapeutic Interventions: 1 to 1 sessions, Unit Group sessions and Medication administration.  Evaluation of Outcomes: Adequate for Discharge   RN Treatment Plan for Primary Diagnosis: Bipolar II disorder (HCC) Long Term Goal(s): Knowledge of disease and therapeutic regimen to maintain health will improve  Short Term Goals: Ability to verbalize feelings will improve, Ability to disclose and discuss suicidal ideas, Ability to identify and develop effective coping behaviors will improve and Compliance with prescribed medications will improve  Medication Management: RN will administer medications as ordered by provider, will assess and evaluate patient's response and provide education to patient for prescribed medication. RN will report any adverse and/or side effects to prescribing provider.  Therapeutic Interventions: 1 on 1 counseling sessions, Psychoeducation, Medication administration, Evaluate responses to treatment, Monitor vital signs and CBGs as ordered, Perform/monitor CIWA, COWS, AIMS and Fall Risk screenings as ordered, Perform wound care treatments as ordered.  Evaluation of Outcomes: Adequate for Discharge   LCSW Treatment Plan for Primary Diagnosis: Bipolar II disorder (HCC) Long Term Goal(s): Safe transition to appropriate next level of care at discharge, Engage patient in therapeutic group  addressing interpersonal concerns.  Short Term Goals: Engage patient in aftercare planning with referrals and resources  Therapeutic Interventions: Assess for all discharge needs, 1 to 1 time with Social worker, Explore available resources and support systems, Assess for adequacy in community support network, Educate family and significant other(s) on suicide prevention, Complete Psychosocial Assessment, Interpersonal group therapy.  Evaluation of Outcomes: Adequate for Discharge   Progress in Treatment: Attending groups: Yes. Participating in groups: Yes. Taking medication as prescribed: Yes. Toleration medication: Yes. Family/Significant other contact made: No, will contact:  the patient's mother Patient understands diagnosis: Yes. Discussing patient identified problems/goals with staff: Yes. Medical problems stabilized or resolved: Yes. Denies suicidal/homicidal ideation: Yes. Issues/concerns per patient self-inventory: No. Other:   New problem(s) identified: None   New Short Term/Long Term Goal(s): medication stabilization, elimination of SI thoughts, development of comprehensive mental wellness plan.    Patient Goals:  "To find the right medications"   Discharge Plan or Barriers: Patient recently admitted. CSW will continue to follow and assess for appropriate referrals and possible discharge planning.    Reason for Continuation of Hospitalization: Anxiety Depression Medication stabilization Suicidal ideation  Estimated Length of Stay: 3-5 days   Attendees: Patient: Chelsea Hoover  11/29/2019 10:30 AM  Physician: Dr. Landry Mellow, MD 11/29/2019 10:30 AM  Nursing:  11/29/2019  10:30 AM  RN Care Manager: 11/29/2019 10:30 AM  Social Worker: Baldo Daub, LCSW 11/29/2019 10:30 AM  Recreational Therapist:  11/29/2019 10:30 AM  Other:  11/29/2019 10:30 AM  Other:  11/29/2019 10:30 AM  Other: 11/29/2019 10:30 AM    Scribe for Treatment Team: Maeola Sarah,  LCSWA 11/29/2019 10:30 AM

## 2019-11-29 NOTE — Discharge Summary (Signed)
Physician Discharge Summary Note  Patient:  Chelsea Hoover is an 24 y.o., female MRN:  [id number] DOB:  November 29, 1995 Patient phone:  [phone] (home)  Patient address:   [address] [place] [address],  Total Time spent with patient: 15 minutes  Date of Admission:  11/26/2019 Date of Discharge: 11/29/19  Reason for Admission:  depression  Principal Problem: Bipolar II disorder [facility]) Discharge Diagnoses: Principal Problem:   Bipolar II disorder (HCC)   Past Psychiatric History: Bipolar disorder with previous hospitalizations. Most recently hospitalized at [facility] last January, 2021. Has been on the child/adolescent unit in 2016 and discharged on Celexa, Lamictal, and trazodone. One suicide attempt years ago by overdose on pills. History of periods with increased impulsivity and activity and decreased sleep. History of self-cutting years ago. Denies history of psychosis.  Past Medical History:  Past Medical History:  Diagnosis Date  . Anxiety   . 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 Bipolar 1 disorder (HCC)   . Cannabis abuse 09/03/2015  . Depression   . Irritable bowel syndrome   . Lyme disease   . Mood disorder (HCC)   . PTSD (post-traumatic stress disorder) 09/01/2015    Past Surgical History:  Procedure Laterality Date  . TONSILECTOMY/ADENOIDECTOMY WITH MYRINGOTOMY Bilateral    August 2020   Family History:  Family History  Problem Relation Age of Onset  . Crohn's disease Father   . Bipolar disorder Maternal Aunt   . Schizophrenia Maternal Grandfather   . Bipolar disorder Maternal Grandfather    Family Psychiatric  History: Sister with depression Social History:  Social History   Substance and Sexual Activity  Alcohol Use No     Social History   Substance and Sexual Activity  Drug Use Yes  . Types: Marijuana   Comment: Few times a week    Social History   Socioeconomic History  . Marital status: Single    Spouse name: Not on file  . Number  of children: Not on file  . Years of education: Not on file  . Highest education level: Not on file  Occupational History  . Not on file  Tobacco Use  . Smoking status: Never Smoker  . Smokeless tobacco: Never Used  Substance and Sexual Activity  . Alcohol use: No  . Drug use: Yes    Types: Marijuana    Comment: Few times a week  . Sexual activity: Yes    Birth control/protection: Pill  Other Topics Concern  . Not on file  Social History Narrative  . Not on file   Social Determinants of Health   Financial Resource Strain:   . Difficulty of Paying Living Expenses:   Food Insecurity:   . Worried About Programme researcher, broadcasting/film/video in the Last Year:   . Barista in the Last Year:   Transportation Needs:   . Freight forwarder (Medical):   Marland Kitchen Lack of Transportation (Non-Medical):   Physical Activity:   . Days of Exercise per Week:   . Minutes of Exercise per Session:   Stress:   . Feeling of Stress :   Social Connections:   . Frequency of Communication with Friends and Family:   . Frequency of Social Gatherings with Friends and Family:   . Attends Religious Services:   . Active Member of Clubs or Organizations:   . Attends Banker Meetings:   Marland Kitchen Marital Status:     Hospital Course:  From admission H&P: Patient is a  24 year old female with a past psychiatric history significant for posttraumatic stress disorder and bipolar disorder type II who presented as a walk-in to the behavioral health hospital on 11/26/2019 with worsening depression. The patient stated that her medications had stopped working. She stated that she had become increasingly more depressed, having anhedonia, isolating, feelings of worthlessness and guilt. She is followed by Dr. Jannifer Franklin as an outpatient, but had not seen him in a while. The patient had been recently admitted to the hospital here in January 2021. Her medications on discharge included Lamictal, Abilify and Wellbutrin. After that  hospitalization she was in the intensive outpatient program/partial hospital program. She continued in that until October 06, 2019. She stated that she had been on several antidepressants in the past, and had side effects or problems with it. It sounds like they had attempted to increase her Wellbutrin, but "it made me worse". She stated she felt as though that the Lamictal was beneficial, and wanted to make sure we would continue that. She was admitted to the hospital for evaluation and stabilization.  Ms. Keats was admitted for depression. She remained on the [facility] unit for three days. Wellbutrin was discontinued, and Cymbalta was started. She participated in group therapy on the unit. She responded well to treatment with no adverse effects reported. She has shown improved mood, affect, sleep, and interaction. She denies any SI/HI/AVH and contracts for safety. She is discharging on the medications listed below. She agrees to follow up with Dr. Jannifer Franklin and Ethelene Browns (see below). Patient is provided with prescriptions for medications upon discharge. She is discharging home via personal transportation.  Physical Findings: AIMS: Facial and Oral Movements Muscles of Facial Expression: None, normal Lips and Perioral Area: None, normal Jaw: None, normal Tongue: None, normal,Extremity Movements Upper (arms, wrists, hands, fingers): None, normal Lower (legs, knees, ankles, toes): None, normal, Trunk Movements Neck, shoulders, hips: None, normal, Overall Severity Severity of abnormal movements (highest score from questions above): None, normal Incapacitation due to abnormal movements: None, normal Patient's awareness of abnormal movements (rate only patient's report): No Awareness, Dental Status Current problems with teeth and/or dentures?: No Does patient usually wear dentures?: No  CIWA:  CIWA-Ar Total: 0 COWS:  COWS Total Score: 2  Musculoskeletal: Strength & Muscle Tone: within normal  limits Gait & Station: normal Patient leans: N/A  Psychiatric Specialty Exam: Physical Exam  Nursing note and vitals reviewed. Constitutional: She is oriented to person, place, and time. She appears well-developed and well-nourished.  Cardiovascular: Normal rate.  Respiratory: Effort normal.  Neurological: She is alert and oriented to person, place, and time.    Review of Systems  Constitutional: Negative.   Respiratory: Negative for cough and shortness of breath.   Gastrointestinal: Negative for nausea and vomiting.  Psychiatric/Behavioral: Negative for agitation, behavioral problems, dysphoric mood, hallucinations, self-injury, sleep disturbance and suicidal ideas. The patient is not nervous/anxious and is not hyperactive.     Blood pressure 105/84, pulse (!) 107, temperature 98.9 F (37.2 C), resp. rate 16, height 5\' 1"  (1.549 m), weight 91.2 kg, SpO2 100 %.Body mass index is 37.98 kg/m.  See MD's discharge SRA      Has this patient used any form of tobacco in the last 30 days? (Cigarettes, Smokeless Tobacco, Cigars, and/or Pipes)  No  Blood Alcohol level:  Lab Results  Component Value Date   [facility] <11 08/02/2014    Metabolic Disorder Labs:  Lab Results  Component Value Date   HGBA1C 5.3 09/07/2019  MPG 105 09/07/2019   MPG 120 09/01/2015   No results found for: PROLACTIN Lab Results  Component Value Date   CHOL 166 09/07/2019   TRIG 72 09/07/2019   HDL 63 09/07/2019   CHOLHDL 2.6 09/07/2019   VLDL 14 09/07/2019   LDLCALC 89 09/07/2019   LDLCALC 62 09/01/2015    See Psychiatric Specialty Exam and Suicide Risk Assessment completed by Attending Physician prior to discharge.  Discharge destination:  Home  Is patient on multiple antipsychotic therapies at discharge:  No   Has Patient had three or more failed trials of antipsychotic monotherapy by history:  No  Recommended Plan for Multiple Antipsychotic Therapies: NA  Discharge Instructions    Discharge  instructions   Complete by: As directed    Patient is instructed to take all prescribed medications as recommended. Report any side effects or adverse reactions to your outpatient psychiatrist. Patient is instructed to abstain from alcohol and illegal drugs while on prescription medications. In the event of worsening symptoms, patient is instructed to call the crisis hotline, 911, or go to the nearest emergency department for evaluation and treatment.     Allergies as of 11/29/2019      Reactions   Lactose Intolerance (gi) Other (See Comments)   Pain in stomach   Oxycodone Other (See Comments)   Intense vomiting   Latex Rash      Medication List    STOP taking these medications   b complex vitamins tablet   buPROPion 150 MG 24 hr tablet Commonly known as: WELLBUTRIN XL   cholecalciferol 25 MCG (1000 UNIT) tablet Commonly known as: VITAMIN D3   hydrOXYzine 25 MG tablet Commonly known as: ATARAX/VISTARIL   multivitamin with minerals Tabs tablet     TAKE these medications     Indication  ARIPiprazole 10 MG tablet Commonly known as: ABILIFY Take 1 tablet (10 mg total) by mouth daily.  Indication: Major Depressive Disorder   DULoxetine 20 MG capsule Commonly known as: CYMBALTA Take 1 capsule (20 mg total) by mouth daily. Start taking on: November 30, 2019  Indication: Major Depressive Disorder   famotidine 20 MG tablet Commonly known as: PEPCID Take 1 tablet (20 mg total) by mouth 2 (two) times daily.  Indication: Gastroesophageal Reflux Disease   lamoTRIgine 100 MG tablet Commonly known as: LAMICTAL Take 1 tablet (100 mg total) by mouth 2 (two) times daily. What changed:   medication strength  how much to take  Indication: Manic-Depression   traZODone 150 MG tablet Commonly known as: DESYREL Take 0.5 tablets (75 mg total) by mouth at bedtime. What changed:   medication strength  when to take this  reasons to take this  Indication: Trevorton, Mojeed, MD Follow up on 12/07/2019.   Specialty: Psychiatry Why: You are scheduled for an appointment on 12/07/19 at 1:30 pm.  This will be a virtual tele-health appointment.  Contact information: [place] [address] [phone]        Dorien Chihuahua. Go on 12/08/2019.   Why: You are scheduled for an appointment on 12/08/19 at 1:00 pm.  This appointment will be held in person. Contact information: [employer], [address] [place], [place] [address] P: [phone] F: [phone]          Follow-up recommendations: Activity as tolerated. Diet as recommended by primary care physician. Keep all scheduled  follow-up appointments as recommended.   Comments:   Patient is instructed to take all prescribed medications as recommended. Report any side effects or adverse reactions to your outpatient psychiatrist. Patient is instructed to abstain from alcohol and illegal drugs while on prescription medications. In the event of worsening symptoms, patient is instructed to call the crisis hotline, 911, or go to the nearest emergency department for evaluation and treatment.  Signed: Aldean Baker, NP 11/29/2019, 2:38 PM

## 2019-11-29 NOTE — [facility] Suicide Risk Assessment (Signed)
[facility] Discharge Suicide Risk Assessment   Principal Problem: Bipolar II disorder [facility]) Discharge Diagnoses: Principal Problem:   Bipolar II disorder (HCC)   Total Time spent with patient: 20 minutes  Musculoskeletal: Strength & Muscle Tone: within normal limits Gait & Station: normal Patient leans: N/A  Psychiatric Specialty Exam: Review of Systems  All other systems reviewed and are negative.   Blood pressure 105/84, pulse (!) 107, temperature 98.9 F (37.2 C), resp. rate 16, height 5\' 1"  (1.549 m), weight 91.2 kg, SpO2 100 %.Body mass index is 37.98 kg/m.  General Appearance: Casual  Eye Contact::  Fair  Speech:  Normal Rate409  Volume:  Normal  Mood:  Euthymic  Affect:  Congruent  Thought Process:  Coherent and Descriptions of Associations: Intact  Orientation:  Full (Time, Place, and Person)  Thought Content:  Logical  Suicidal Thoughts:  No  Homicidal Thoughts:  No  Memory:  Immediate;   Fair Recent;   Fair Remote;   Fair  Judgement:  Intact  Insight:  Fair  Psychomotor Activity:  Normal  Concentration:  Good  Recall:  Good  Fund of Knowledge:Fair  Language: Good  Akathisia:  Negative  Handed:  Right  AIMS (if indicated):     Assets:  Desire for Improvement Housing Resilience  Sleep:  Number of Hours: 6.75  Cognition: WNL  ADL's:  Intact   Mental Status Per Nursing Assessment::   On Admission:  Suicidal ideation indicated by patient  Demographic Factors:  Caucasian  Loss Factors: NA  Historical Factors: Impulsivity  Risk Reduction Factors:   Employed, Living with another person, especially a relative and Positive therapeutic relationship  Continued Clinical Symptoms:  Severe Anxiety and/or Agitation Bipolar Disorder:   Depressive phase Depression:   Impulsivity  Cognitive Features That Contribute To Risk:  None    Suicide Risk:  Minimal: No identifiable suicidal ideation.  Patients presenting with no risk factors but with morbid  ruminations; may be classified as minimal risk based on the severity of the depressive symptoms    Plan Of Care/Follow-up recommendations:  Activity:  ad lib  [phone], MD 11/29/2019, 10:20 AM

## 2019-11-29 NOTE — Progress Notes (Signed)
Discharge Note:  Patient discharged home.  Patient denied SI and HI.  Denied A/V hallucinations.  Suicide prevention information given and discussed with the patient, who stated she understood and had no questions.  Patient stated she received all her belongings, clothing, toiletries, misc items, etc. Patient stated she appreciated all assistance received from [facility] staff.  All required discharge information given to patient at discharge.

## 2019-11-29 NOTE — [facility] Suicide Risk Assessment (Signed)
[facility] INPATIENT:  Family/Significant Other Suicide Prevention Education  Suicide Prevention Education:  Patient Refusal for Family/Significant Other Suicide Prevention Education: The patient Chelsea Hoover has refused to provide written consent for family/significant other to be provided Family/Significant Other Suicide Prevention Education during admission and/or prior to discharge.  Physician notified.   SPE reviewed with patient prior to discharge. One prior attempt to reach mother.  Darreld Mclean 11/29/2019, 1:33 PM

## 2019-11-29 NOTE — Progress Notes (Signed)
D:  Patient's self inventory sheet, patient sleeps good, sleep medication helpful.  Fair appetite, low energy level, poor concentration.  Rated depression, hopeless and anxiety 2.  Denied withdrawals. Denied SI.  Physical problems, exhaustion.  Denied physical pain.  Goal is monitor new meds, talk to MD.   Enjoys group.  Does have discharge plans. A:  Medications administered per MD orders.  Emotional support and encouragement given patient. R:  Denied SI and HI, contracts for safety.  Denied A/V hallucinations.  Safety maintained with 15 minute checks.

## 2019-11-29 NOTE — [facility] Suicide Risk Assessment (Signed)
[facility] INPATIENT:  Family/Significant Other Suicide Prevention Education  Suicide Prevention Education:  Contact Attempts: with mother, Cariann Kinnamon [phone]) has been identified by the patient as the family member/significant other with whom the patient will be residing, and identified as the person(s) who will aid the patient in the event of a mental health crisis.  With written consent from the patient, two attempts were made to provide suicide prevention education, prior to and/or following the patient's discharge.  We were unsuccessful in providing suicide prevention education.  A suicide education pamphlet was given to the patient to share with family/significant other.  Date and time of first attempt:11/29/19 / 10:59am  Date and time of second attempt: Needs to be done  Maeola Sarah 11/29/2019, 10:59 AM

## 2019-11-29 NOTE — Progress Notes (Signed)
Recreation Therapy Notes  Date:  3.29.21 Time: 0930 Location: 300 Hall Group Room  Group Topic: Stress Management  Goal Area(s) Addresses:  Patient will identify positive stress management techniques. Patient will identify benefits of using stress management post d/c.  Intervention: Stress Management  Activity : Meditation.  LRT played a meditation that focused on letting go of the past and living in the moment.  Patients were to listen and follow along as meditation played to engage in activity.  Education:  Stress Management, Discharge Planning.   Education Outcome: Acknowledges Education  Clinical Observations/Feedback: Pt did not attend activity.   Corvin Sorbo, LRT/CTRS         Szymon Foiles A 11/29/2019 11:04 AM 

## 2019-11-29 NOTE — [facility] Counselor (Signed)
LCSW Group Therapy Note   11/29/2019 2:32 PM  Type of Therapy and Topic:  Group Therapy:  Overcoming Obstacles   Participation Level:  Did Not Attend   Description of Group:    In this group patients will be encouraged to explore what they see as obstacles to their own wellness and recovery. They will be guided to discuss their thoughts, feelings, and behaviors related to these obstacles. The group will process together ways to cope with barriers, with attention given to specific choices patients can make. Each patient will be challenged to identify changes they are motivated to make in order to overcome their obstacles. This group will be process-oriented, with patients participating in exploration of their own experiences as well as giving and receiving support and challenge from other group members.   Therapeutic Goals: 1. Patient will identify personal and current obstacles as they relate to admission. 2. Patient will identify barriers that currently interfere with their wellness or overcoming obstacles.  3. Patient will identify feelings, thought process and behaviors related to these barriers. 4. Patient will identify two changes they are willing to make to overcome these obstacles:      Summary of Patient Progress X   Therapeutic Modalities:   Cognitive Behavioral Therapy Solution Focused Therapy Motivational Interviewing Relapse Prevention Therapy  Betty Brooks, MSW, LCSW 11/29/2019 2:32 PM    

## 2021-04-27 ENCOUNTER — Inpatient Hospital Stay (HOSPITAL_COMMUNITY)
Admission: EM | Admit: 2021-04-27 | Discharge: 2021-05-01 | DRG: 349 | Disposition: A | Discharge status: Home / Self Care | Payer: Self-pay | Attending: Internal Medicine | Admitting: Internal Medicine

## 2021-04-27 ENCOUNTER — Other Ambulatory Visit: Payer: Self-pay

## 2021-04-27 ENCOUNTER — Emergency Department (HOSPITAL_COMMUNITY): Payer: Self-pay

## 2021-04-27 DIAGNOSIS — Z885 Allergy status to narcotic agent status: Secondary | ICD-10-CM

## 2021-04-27 DIAGNOSIS — K612 Anorectal abscess: Principal | ICD-10-CM | POA: Diagnosis present

## 2021-04-27 DIAGNOSIS — F4310 Post-traumatic stress disorder, unspecified: Secondary | ICD-10-CM | POA: Diagnosis present

## 2021-04-27 DIAGNOSIS — B9620 Unspecified Escherichia coli [E. coli] as the cause of diseases classified elsewhere: Secondary | ICD-10-CM | POA: Diagnosis present

## 2021-04-27 DIAGNOSIS — F32A Depression, unspecified: Secondary | ICD-10-CM | POA: Diagnosis present

## 2021-04-27 DIAGNOSIS — E876 Hypokalemia: Secondary | ICD-10-CM | POA: Diagnosis present

## 2021-04-27 DIAGNOSIS — Z79899 Other long term (current) drug therapy: Secondary | ICD-10-CM

## 2021-04-27 DIAGNOSIS — Z20822 Contact with and (suspected) exposure to covid-19: Secondary | ICD-10-CM | POA: Diagnosis present

## 2021-04-27 DIAGNOSIS — K610 Anal abscess: Secondary | ICD-10-CM

## 2021-04-27 DIAGNOSIS — F938 Other childhood emotional disorders: Secondary | ICD-10-CM | POA: Diagnosis present

## 2021-04-27 DIAGNOSIS — Z91040 Latex allergy status: Secondary | ICD-10-CM

## 2021-04-27 DIAGNOSIS — K611 Rectal abscess: Secondary | ICD-10-CM

## 2021-04-27 DIAGNOSIS — F319 Bipolar disorder, unspecified: Secondary | ICD-10-CM | POA: Diagnosis present

## 2021-04-27 DIAGNOSIS — E739 Lactose intolerance, unspecified: Secondary | ICD-10-CM | POA: Diagnosis present

## 2021-04-27 DIAGNOSIS — R509 Fever, unspecified: Secondary | ICD-10-CM

## 2021-04-27 LAB — CBC WITH DIFFERENTIAL/PLATELET
Abs Immature Granulocytes: 0.07 10*3/uL (ref 0.00–0.07)
Basophils Absolute: 0.1 10*3/uL (ref 0.0–0.1)
Basophils Relative: 0 %
Eosinophils Absolute: 0.2 10*3/uL (ref 0.0–0.5)
Eosinophils Relative: 1 %
HCT: 39.8 % (ref 36.0–46.0)
Hemoglobin: 13.6 g/dL (ref 12.0–15.0)
Immature Granulocytes: 0 %
Lymphocytes Relative: 13 %
Lymphs Abs: 2.1 10*3/uL (ref 0.7–4.0)
MCH: 30 pg (ref 26.0–34.0)
MCHC: 34.2 g/dL (ref 30.0–36.0)
MCV: 87.7 fL (ref 80.0–100.0)
Monocytes Absolute: 0.9 10*3/uL (ref 0.1–1.0)
Monocytes Relative: 6 %
Neutro Abs: 13.5 10*3/uL — ABNORMAL HIGH (ref 1.7–7.7)
Neutrophils Relative %: 80 %
Platelets: 337 10*3/uL (ref 150–400)
RBC: 4.54 MIL/uL (ref 3.87–5.11)
RDW: 12.1 % (ref 11.5–15.5)
WBC: 16.8 10*3/uL — ABNORMAL HIGH (ref 4.0–10.5)
nRBC: 0 % (ref 0.0–0.2)

## 2021-04-27 LAB — LACTIC ACID, PLASMA
Lactic Acid, Venous: 0.8 mmol/L (ref 0.5–1.9)
Lactic Acid, Venous: 0.7 mmol/L (ref 0.5–1.9)

## 2021-04-27 LAB — COMPREHENSIVE METABOLIC PANEL
ALT: 18 U/L (ref 0–44)
AST: 16 U/L (ref 15–41)
Albumin: 4 g/dL (ref 3.5–5.0)
Alkaline Phosphatase: 63 U/L (ref 38–126)
Anion gap: 10 (ref 5–15)
BUN: 7 mg/dL (ref 6–20)
CO2: 23 mmol/L (ref 22–32)
Calcium: 9.1 mg/dL (ref 8.9–10.3)
Chloride: 103 mmol/L (ref 98–111)
Creatinine, Ser: 0.99 mg/dL (ref 0.44–1.00)
GFR, Estimated: >60 mL/min (ref >60)
Glucose, Bld: 90 mg/dL (ref 70–99)
Potassium: 3.4 mmol/L — ABNORMAL LOW (ref 3.5–5.1)
Sodium: 136 mmol/L (ref 135–145)
Total Bilirubin: 0.9 mg/dL (ref 0.3–1.2)
Total Protein: 7.1 g/dL (ref 6.5–8.1)

## 2021-04-27 LAB — I-STAT BETA HCG BLOOD, ED (MC, WL, AP ONLY): I-stat hCG, quantitative: <5 m[IU]/mL (ref <5)

## 2021-04-27 MED ORDER — SODIUM CHLORIDE 0.9 % IV BOLUS
1000.0000 mL | Freq: Once | INTRAVENOUS | Status: AC
  Administered 2021-04-27: 1000 mL via INTRAVENOUS

## 2021-04-27 MED ORDER — MORPHINE SULFATE (PF) 4 MG/ML IV SOLN
4.0000 mg | Freq: Once | INTRAVENOUS | Status: AC
  Administered 2021-04-27: 4 mg via INTRAVENOUS
  Filled 2021-04-27: qty 1

## 2021-04-27 MED ORDER — IOHEXOL 350 MG/ML SOLN
80.0000 mL | Freq: Once | INTRAVENOUS | Status: AC | PRN
  Administered 2021-04-27: 80 mL via INTRAVENOUS

## 2021-04-27 MED ORDER — ACETAMINOPHEN 325 MG PO TABS
650.0000 mg | ORAL_TABLET | Freq: Once | ORAL | Status: AC
  Administered 2021-04-27: 650 mg via ORAL
  Filled 2021-04-27: qty 2

## 2021-04-27 MED ORDER — IBUPROFEN 400 MG PO TABS
600.0000 mg | ORAL_TABLET | Freq: Once | ORAL | Status: AC
  Administered 2021-04-27: 600 mg via ORAL
  Filled 2021-04-27: qty 1

## 2021-04-27 MED ORDER — ONDANSETRON HCL 4 MG/2ML IJ SOLN
4.0000 mg | Freq: Once | INTRAMUSCULAR | Status: AC
  Administered 2021-04-27: 4 mg via INTRAVENOUS
  Filled 2021-04-27: qty 2

## 2021-04-27 NOTE — ED Notes (Signed)
Pt difficult stick

## 2021-04-27 NOTE — ED Provider Notes (Addendum)
[facility] EMERGENCY DEPARTMENT Provider Note   CSN: [id number] Arrival date & time: 04/27/21  1610     History Chief Complaint  Patient presents with   Hemorrhoids    Chelsea Hoover is a 25 y.o. female.  Patient presents with complaint of rectal pain fevers and chills.  Symptoms ongoing for 3 days.  She states she went to urgent care and was told to go to the ER for further evaluation.  She been applying topical medication to her rectum without improvement of symptoms.  Denies vomiting or cough.  Denies abdominal pain or diarrhea.      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     Family History  Problem Relation Age of Onset   Crohn's disease Father    Bipolar disorder Maternal Aunt    Schizophrenia Maternal Grandfather    Bipolar disorder Maternal Grandfather     Social History   Tobacco Use   Smoking status: Never   Smokeless tobacco: Never  Substance Use Topics   Alcohol use: No   Drug use: Yes    Types: Marijuana    Comment: Few times a week    Home Medications Prior to Admission medications   Medication Sig Start Date End Date Taking? Authorizing Provider   ARIPiprazole (ABILIFY) 10 MG tablet Take 1 tablet (10 mg total) by mouth daily. 11/29/19   Aldean Baker, NP  DULoxetine (CYMBALTA) 20 MG capsule Take 1 capsule (20 mg total) by mouth daily. 11/30/19   Aldean Baker, NP  famotidine (PEPCID) 20 MG tablet Take 1 tablet (20 mg total) by mouth 2 (two) times daily. 11/29/19   Aldean Baker, NP  lamoTRIgine (LAMICTAL) 100 MG tablet Take 1 tablet (100 mg total) by mouth 2 (two) times daily. 11/29/19   Aldean Baker, NP  traZODone (DESYREL) 150 MG tablet Take 0.5 tablets (75 mg total) by mouth at bedtime. 11/29/19   Aldean Baker, NP    Allergies    Lactose intolerance (gi), Oxycodone, and Latex  Review of Systems   Review of Systems  Constitutional:  Negative for fever.  HENT:  Negative for ear pain.   Eyes:  Negative for pain.  Respiratory:  Negative for cough.   Cardiovascular:  Negative for chest pain.  Gastrointestinal:  Negative for abdominal pain.  Genitourinary:  Negative for flank pain.  Musculoskeletal:  Negative for back pain.  Skin:  Negative for rash.  Neurological:  Negative for headaches.   Physical Exam Updated Vital Signs BP 119/77 (BP Location: Left Arm)   Pulse 85   Temp 99 F (37.2 C) (Oral)   Resp  18   SpO2 99%   Physical Exam Constitutional:      General: She is not in acute distress.    Appearance: Normal appearance.  HENT:     Head: Normocephalic.     Nose: Nose normal.  Eyes:     Extraocular Movements: Extraocular movements intact.  Cardiovascular:     Rate and Rhythm: Normal rate.  Pulmonary:     Effort: Pulmonary effort is normal.  Genitourinary:    Comments: Rectal exam was normal appearing.  No external hernia noted.  No perirectal mass palpated moderate discomfort on rectal exam. Musculoskeletal:        General: Normal range of motion.     Cervical back: Normal range of motion.  Neurological:     General: No focal deficit present.     Mental Status: She is alert. Mental status is at  baseline.    ED Results / Procedures / Treatments   Labs (all labs ordered are listed, but only abnormal results are displayed) Labs Reviewed  CBC WITH DIFFERENTIAL/PLATELET - Abnormal; Notable for the following components:      Result Value   WBC 16.8 (*)    Neutro Abs 13.5 (*)    All other components within normal limits  COMPREHENSIVE METABOLIC PANEL - Abnormal; Notable for the following components:   Potassium 3.4 (*)    All other components within normal limits  RESP PANEL BY RT-PCR (FLU A&B, COVID) ARPGX2  CULTURE, BLOOD (ROUTINE X 2)  CULTURE, BLOOD (ROUTINE X 2)  LACTIC ACID, PLASMA  LACTIC ACID, PLASMA  URINALYSIS, ROUTINE W REFLEX MICROSCOPIC  PREGNANCY, URINE  I-STAT BETA HCG BLOOD, ED ([facility], [facility], [facility] ONLY)    EKG None  Radiology CT PELVIS W CONTRAST  Result Date: 04/28/2021 CLINICAL DATA:  Concern for perianal abscess. EXAM: CT PELVIS WITH CONTRAST TECHNIQUE: Multidetector CT imaging of the pelvis was performed using the standard protocol following the bolus administration of intravenous contrast. CONTRAST:  44mL OMNIPAQUE IOHEXOL 350 MG/ML SOLN COMPARISON:  None. FINDINGS: Urinary Tract:  No abnormality visualized. Bowel:  Unremarkable visualized pelvic bowel loops. Vascular/Lymphatic: No pathologically enlarged lymph nodes. No significant vascular abnormality seen. Reproductive: The uterus is anteverted. An intrauterine device is noted. Evaluation for positioning of the IUD is very limited on this CT. However, the IUD may be slightly inferiorly displaced. Further evaluation with pelvic ultrasound on a nonemergent/outpatient basis recommended. The ovaries are unremarkable. Other: There is inflammatory changes and induration of the superficial soft tissues of the left perianal region. There is a 3.1 x 2.7 cm ill-defined low-attenuation in the left perianal soft tissues which may represent a phlegmon or developing abscess. Ultrasound may provide better evaluation.  Musculoskeletal: No suspicious bone lesions identified. IMPRESSION: Left perianal phlegmon or developing abscess. Electronically Signed   By: Elgie Collard M.D.   On: 04/28/2021 00:07    Procedures Procedures   Medications Ordered in ED Medications  piperacillin-tazobactam (ZOSYN) IVPB 3.375 g (has no administration in time range)  sodium chloride 0.9 % bolus 1,000 mL (0 mLs Intravenous Stopped 04/27/21 2045)  morphine 4 MG/ML injection 4 mg (4 mg Intravenous Given 04/27/21 2006)  ondansetron (ZOFRAN) injection 4 mg (4 mg Intravenous Given 04/27/21 2007)  acetaminophen (TYLENOL) tablet 650 mg (650 mg Oral Given 04/27/21 2242)  ibuprofen (ADVIL) tablet 600 mg (600 mg Oral Given 04/27/21 2242)  sodium chloride 0.9 % bolus 1,000 mL (0 mLs Intravenous Stopped 04/28/21 0010)  iohexol (OMNIPAQUE) 350 MG/ML injection 80 mL (80 mLs Intravenous  Contrast Given 04/27/21 2353)    ED Course  I have reviewed the triage vital signs and the nursing notes.  Pertinent labs & imaging results that were available during my care of the patient were reviewed by me and considered in my medical decision making (see chart for details).    MDM Rules/Calculators/A&P                           Patient is febrile and tachycardic.  Labs are sent white count of 16 lactic acid is normal.  Vitals improved with fluid resuscitation.  Pending urinalysis as well as CT imaging.  Concerning for developing phlegmon/abscess  On exam of the perirectal region there is no palpable lesion or fluctuance to incise or drain.  Patient started on broad-spectrum antibiotics after cultures are sent.  Admitted to the medical team.   Final Clinical Impression(s) / ED Diagnoses Final diagnoses:  Febrile illness  Perirectal abscess    Rx / DC Orders ED Discharge Orders     None        Cheryll Cockayne, MD 04/27/21 2342    Cheryll Cockayne, MD 04/28/21 [phone]

## 2021-04-27 NOTE — ED Triage Notes (Signed)
Pt came in POV d/t pain "near anus" (per pt). She states that she went to UC & was told she had hemorrhoids & she states the pain is worse and cannot sit down & the pain "spreads" to the left of her anus.. Unable to determine if it is bleeding while she is menstruating. Rates pain 10/10, last BM was yesterday & was soft.

## 2021-04-27 NOTE — ED Provider Notes (Signed)
Emergency Medicine Provider Triage Evaluation Note  Britanni Yarde , a 25 y.o. female  was evaluated in triage.  Pt complains of hemorrhoids and rectal pain.  She states that she went to urgent care and was told she has hemorrhoids.  She has been trying Preparation H and a suppository however states the pain is spreading and worsening.  She denies any fevers.  Last bowel movement was yesterday and was soft.  She states that her pain is severe enough that she is unable to sit up..  Review of Systems  Positive: Rectal pain Negative: Vomiting, fevers  Physical Exam  BP 137/89 (BP Location: Left Arm)   Pulse (!) 126   Temp 99.5 F (37.5 C) (Oral)   Resp (!) 22   SpO2 100%  Gen:   Awake, no distress   Resp:  Normal effort  MSK:   Moves extremities without difficulty  Other:  Laying in recliner on side, exam deferred due to triage.   Medical Decision Making  Medically screening exam initiated at 5:20 PM.  Appropriate orders placed.  Diasha Castleman was informed that the remainder of the evaluation will be completed by another provider, this initial triage assessment does not replace that evaluation, and the importance of remaining in the ED until their evaluation is complete.  Patient was tachycardic here, this is improved with her laying down and I suspect is more due to her pain.  No abdominal pain or fevers.  Note: Portions of this report may have been transcribed using voice recognition software. Every effort was made to ensure accuracy; however, inadvertent computerized transcription errors may be present    Norman Clay 04/27/21 1721    Cheryll Cockayne, MD 04/27/21 2035

## 2021-04-28 ENCOUNTER — Other Ambulatory Visit: Payer: Self-pay

## 2021-04-28 ENCOUNTER — Encounter (HOSPITAL_COMMUNITY): Payer: Self-pay | Admitting: Internal Medicine

## 2021-04-28 DIAGNOSIS — K611 Rectal abscess: Secondary | ICD-10-CM

## 2021-04-28 DIAGNOSIS — F938 Other childhood emotional disorders: Secondary | ICD-10-CM

## 2021-04-28 DIAGNOSIS — F4310 Post-traumatic stress disorder, unspecified: Secondary | ICD-10-CM

## 2021-04-28 DIAGNOSIS — K602 Anal fissure, unspecified: Secondary | ICD-10-CM | POA: Insufficient documentation

## 2021-04-28 DIAGNOSIS — F32A Depression, unspecified: Secondary | ICD-10-CM

## 2021-04-28 DIAGNOSIS — K610 Anal abscess: Secondary | ICD-10-CM

## 2021-04-28 DIAGNOSIS — F319 Bipolar disorder, unspecified: Secondary | ICD-10-CM

## 2021-04-28 LAB — URINALYSIS, ROUTINE W REFLEX MICROSCOPIC
Bacteria, UA: NONE SEEN
Bilirubin Urine: NEGATIVE
Glucose, UA: NEGATIVE mg/dL
Ketones, ur: 20 mg/dL — AB
Nitrite: NEGATIVE
Protein, ur: NEGATIVE mg/dL
RBC / HPF: >50 RBC/hpf — ABNORMAL HIGH (ref 0–5)
Specific Gravity, Urine: 1.017 (ref 1.005–1.030)
pH: 5 (ref 5.0–8.0)

## 2021-04-28 LAB — MAGNESIUM: Magnesium: 1.8 mg/dL (ref 1.7–2.4)

## 2021-04-28 LAB — BASIC METABOLIC PANEL
Anion gap: 8 (ref 5–15)
BUN: 5 mg/dL — ABNORMAL LOW (ref 6–20)
CO2: 21 mmol/L — ABNORMAL LOW (ref 22–32)
Calcium: 8.3 mg/dL — ABNORMAL LOW (ref 8.9–10.3)
Chloride: 107 mmol/L (ref 98–111)
Creatinine, Ser: 0.91 mg/dL (ref 0.44–1.00)
GFR, Estimated: >60 mL/min (ref >60)
Glucose, Bld: 121 mg/dL — ABNORMAL HIGH (ref 70–99)
Potassium: 3.4 mmol/L — ABNORMAL LOW (ref 3.5–5.1)
Sodium: 136 mmol/L (ref 135–145)

## 2021-04-28 LAB — RESP PANEL BY RT-PCR (FLU A&B, COVID) ARPGX2
Influenza A by PCR: NEGATIVE
Influenza B by PCR: NEGATIVE
SARS Coronavirus 2 by RT PCR: NEGATIVE

## 2021-04-28 LAB — CBC
HCT: 36 % (ref 36.0–46.0)
Hemoglobin: 12 g/dL (ref 12.0–15.0)
MCH: 29.5 pg (ref 26.0–34.0)
MCHC: 33.3 g/dL (ref 30.0–36.0)
MCV: 88.5 fL (ref 80.0–100.0)
Platelets: 313 10*3/uL (ref 150–400)
RBC: 4.07 MIL/uL (ref 3.87–5.11)
RDW: 12 % (ref 11.5–15.5)
WBC: 17.3 10*3/uL — ABNORMAL HIGH (ref 4.0–10.5)
nRBC: 0 % (ref 0.0–0.2)

## 2021-04-28 LAB — PREGNANCY, URINE: Preg Test, Ur: NEGATIVE

## 2021-04-28 LAB — SURGICAL PCR SCREEN
MRSA, PCR: NEGATIVE
Staphylococcus aureus: NEGATIVE

## 2021-04-28 LAB — HIV ANTIBODY (ROUTINE TESTING W REFLEX): HIV Screen 4th Generation wRfx: NONREACTIVE

## 2021-04-28 MED ORDER — BUPROPION HCL 100 MG PO TABS
100.0000 mg | ORAL_TABLET | Freq: Every morning | ORAL | Status: DC
  Administered 2021-04-28 – 2021-05-01 (×4): 100 mg via ORAL
  Filled 2021-04-28 (×4): qty 1

## 2021-04-28 MED ORDER — ONDANSETRON HCL 4 MG/2ML IJ SOLN
4.0000 mg | Freq: Four times a day (QID) | INTRAMUSCULAR | Status: AC | PRN
  Administered 2021-04-28 – 2021-04-29 (×3): 4 mg via INTRAVENOUS
  Filled 2021-04-28 (×3): qty 2

## 2021-04-28 MED ORDER — POTASSIUM CHLORIDE CRYS ER 20 MEQ PO TBCR
40.0000 meq | EXTENDED_RELEASE_TABLET | Freq: Once | ORAL | Status: AC
  Administered 2021-04-28: 40 meq via ORAL
  Filled 2021-04-28: qty 2

## 2021-04-28 MED ORDER — ACETAMINOPHEN 325 MG PO TABS
650.0000 mg | ORAL_TABLET | Freq: Four times a day (QID) | ORAL | Status: DC | PRN
  Administered 2021-04-28: 650 mg via ORAL
  Filled 2021-04-28: qty 2

## 2021-04-28 MED ORDER — MORPHINE SULFATE (PF) 2 MG/ML IV SOLN
1.0000 mg | INTRAVENOUS | Status: DC | PRN
Start: 2021-04-28 — End: 2021-05-01
  Administered 2021-04-28 – 2021-04-29 (×4): 1 mg via INTRAVENOUS
  Filled 2021-04-28 (×4): qty 1

## 2021-04-28 MED ORDER — POLYETHYLENE GLYCOL 3350 17 G PO PACK: 17.0000 g | PACK | Freq: Every day | ORAL | Status: DC | PRN

## 2021-04-28 MED ORDER — ACETAMINOPHEN 650 MG RE SUPP: 650.0000 mg | Freq: Four times a day (QID) | RECTAL | Status: DC | PRN

## 2021-04-28 MED ORDER — PIPERACILLIN-TAZOBACTAM 3.375 G IVPB
3.3750 g | Freq: Three times a day (TID) | INTRAVENOUS | Status: DC
  Administered 2021-04-28 – 2021-05-01 (×10): 3.375 g via INTRAVENOUS
  Filled 2021-04-28 (×13): qty 50

## 2021-04-28 MED ORDER — POTASSIUM CHLORIDE CRYS ER 20 MEQ PO TBCR
20.0000 meq | EXTENDED_RELEASE_TABLET | Freq: Once | ORAL | Status: AC
  Administered 2021-04-28: 20 meq via ORAL
  Filled 2021-04-28: qty 1

## 2021-04-28 MED ORDER — FAMOTIDINE 20 MG PO TABS
20.0000 mg | ORAL_TABLET | Freq: Two times a day (BID) | ORAL | Status: DC
Start: 2021-04-28 — End: 2021-05-01
  Administered 2021-04-28 – 2021-05-01 (×8): 20 mg via ORAL
  Filled 2021-04-28 (×8): qty 1

## 2021-04-28 MED ORDER — TRAZODONE HCL 50 MG PO TABS
75.0000 mg | ORAL_TABLET | Freq: Every day | ORAL | Status: DC
  Administered 2021-04-28 – 2021-04-30 (×4): 75 mg via ORAL
  Filled 2021-04-28 (×4): qty 2

## 2021-04-28 MED ORDER — SODIUM CHLORIDE 0.9% FLUSH
3.0000 mL | Freq: Two times a day (BID) | INTRAVENOUS | Status: DC
  Administered 2021-04-28 – 2021-05-01 (×8): 3 mL via INTRAVENOUS

## 2021-04-28 MED ORDER — LAMOTRIGINE 100 MG PO TABS
100.0000 mg | ORAL_TABLET | Freq: Two times a day (BID) | ORAL | Status: DC
  Administered 2021-04-28 – 2021-05-01 (×8): 100 mg via ORAL
  Filled 2021-04-28 (×9): qty 1

## 2021-04-28 MED ORDER — PIPERACILLIN-TAZOBACTAM 3.375 G IVPB 30 MIN
3.3750 g | Freq: Once | INTRAVENOUS | Status: AC
  Administered 2021-04-28: 3.375 g via INTRAVENOUS
  Filled 2021-04-28: qty 50

## 2021-04-28 MED ORDER — HYDROCODONE-ACETAMINOPHEN 5-325 MG PO TABS
1.0000 | ORAL_TABLET | Freq: Four times a day (QID) | ORAL | Status: DC | PRN
Start: 2021-04-28 — End: 2021-05-01
  Administered 2021-04-28 – 2021-05-01 (×4): 1 via ORAL
  Filled 2021-04-28 (×4): qty 1

## 2021-04-28 MED ORDER — MAGNESIUM SULFATE 2 GM/50ML IV SOLN
2.0000 g | Freq: Once | INTRAVENOUS | Status: AC
  Administered 2021-04-28: 2 g via INTRAVENOUS
  Filled 2021-04-28: qty 50

## 2021-04-28 NOTE — Consult Note (Signed)
Reason for Consult:Perirectal abscess Referring Physician: [name] is an 25 y.o. female.  HPI: This is a 25 year old female with bipolar disorder, PTSD, irritable bowel syndrome, hx of Lyme disease who presented with four days of worsening tenderness and pressure on the left side of her anus.  The pain worsened so she presented to the ED for evaluation.  She was tachycardic and febrile initially.  CT scan shows infection in the left perianal region - phlegmon vs. Early abscess.  The patient is not NPO - she ate breakfast this morning.  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   Bipolar 1 disorder (HCC)    Cannabis abuse 09/03/2015   Depression    Irritable bowel syndrome    Lyme disease    Mood disorder (HCC)    PTSD (post-traumatic stress disorder) 09/01/2015    Past Surgical History:  Procedure Laterality Date   TONSILECTOMY/ADENOIDECTOMY WITH MYRINGOTOMY Bilateral    August 2020    Family History  Problem Relation Age of Onset   Crohn's disease Father    Bipolar disorder Maternal Aunt    Schizophrenia Maternal Grandfather    Bipolar disorder Maternal Grandfather     Social History:  reports that she has never smoked. She has never used smokeless tobacco. She reports current drug use. Drug: Marijuana. She reports that she does not drink alcohol.  Allergies:  Allergies  Allergen Reactions   Oxycodone Other (See Comments)    Intense vomiting   Lactose Intolerance (Gi) Other (See Comments)    Pain in stomach   Latex Rash    Medications:  Prior to Admission medications   Medication Sig Start Date End Date Taking? Authorizing Provider  buPROPion (WELLBUTRIN) 100 MG tablet Take 100 mg by mouth every morning. 03/22/21  Yes [provider]  cetirizine (ZYRTEC) 10 MG tablet Take 10 mg by mouth daily as needed for allergies. 11/16/20  Yes [provider]  famotidine (PEPCID) 20 MG tablet  Take 1 tablet (20 mg total) by mouth 2 (two) times daily. 11/29/19  Yes Aldean Baker, NP  hydrOXYzine (ATARAX/VISTARIL) 25 MG tablet Take 25 mg by mouth daily as needed for anxiety. 03/22/21  Yes [provider]  lamoTRIgine (LAMICTAL) 100 MG tablet Take 1 tablet (100 mg total) by mouth 2 (two) times daily. 11/29/19  Yes Aldean Baker, NP  Lidocaine-Glycerin (PREPARATION H EX) Apply 1 application topically daily as needed (hemorrhoids).   Yes [provider]  traZODone (DESYREL) 150 MG tablet Take 0.5 tablets (75 mg total) by mouth at bedtime. 11/29/19  Yes Aldean Baker, NP  DULoxetine (CYMBALTA) 20 MG capsule Take 1 capsule (20 mg total) by mouth daily. Patient not taking: No sig reported 11/30/19   Aldean Baker, NP     Results for orders placed or performed during the hospital encounter of 04/27/21 (from the past 48 hour(s))  CBC with Differential     Status: Abnormal   Collection Time: 04/27/21  8:11 PM  Result Value Ref Range   WBC 16.8 (H) 4.0 - 10.5 K/uL   RBC 4.54 3.87 - 5.11 MIL/uL   Hemoglobin 13.6 12.0 - 15.0 g/dL   HCT 60.4 54.0 - 98.1 %   MCV 87.7 80.0 - 100.0 fL   MCH 30.0 26.0 - 34.0 pg   MCHC 34.2 30.0 - 36.0 g/dL   RDW 19.1 47.8 - 29.5 %   Platelets 337  150 - 400 K/uL   nRBC 0.0 0.0 - 0.2 %   Neutrophils Relative % 80 %   Neutro Abs 13.5 (H) 1.7 - 7.7 K/uL   Lymphocytes Relative 13 %   Lymphs Abs 2.1 0.7 - 4.0 K/uL   Monocytes Relative 6 %   Monocytes Absolute 0.9 0.1 - 1.0 K/uL   Eosinophils Relative 1 %   Eosinophils Absolute 0.2 0.0 - 0.5 K/uL   Basophils Relative 0 %   Basophils Absolute 0.1 0.0 - 0.1 K/uL   Immature Granulocytes 0 %   Abs Immature Granulocytes 0.07 0.00 - 0.07 K/uL    Comment: Performed at [facility], [address] [place], [place] [address]  Comprehensive metabolic panel     Status: Abnormal   Collection Time: 04/27/21  8:11 PM  Result Value Ref Range   Sodium 136 135 - 145 mmol/L   Potassium 3.4 (L) 3.5 -  5.1 mmol/L   Chloride 103 98 - 111 mmol/L   CO2 23 22 - 32 mmol/L   Glucose, Bld 90 70 - 99 mg/dL    Comment: Glucose reference range applies only to samples taken after fasting for at least 8 hours.   BUN 7 6 - 20 mg/dL   Creatinine, Ser 4.09 0.44 - 1.00 mg/dL   Calcium 9.1 8.9 - 81.1 mg/dL   Total Protein 7.1 6.5 - 8.1 g/dL   Albumin 4.0 3.5 - 5.0 g/dL   AST 16 15 - 41 U/L   ALT 18 0 - 44 U/L   Alkaline Phosphatase 63 38 - 126 U/L   Total Bilirubin 0.9 0.3 - 1.2 mg/dL   GFR, Estimated >91 >47 mL/min    Comment: (NOTE) Calculated using the CKD-EPI Creatinine Equation (2021)    Anion gap 10 5 - 15    Comment: Performed at [facility], [address], [place], [place] [address]  Lactic acid, plasma     Status: None   Collection Time: 04/27/21  8:11 PM  Result Value Ref Range   Lactic Acid, Venous 0.8 0.5 - 1.9 mmol/L    Comment: Performed at [facility], [address], [place], [place] [address]  I-Stat beta hCG blood, ED     Status: None   Collection Time: 04/27/21  9:21 PM  Result Value Ref Range   I-stat hCG, quantitative <5.0 <5 mIU/mL   Comment 3            Comment:   GEST. AGE      CONC.  (mIU/mL)   <=1 WEEK        5 - 50     2 WEEKS       50 - 500     3 WEEKS       100 - 10,000     4 WEEKS     1,000 - 30,000        FEMALE AND NON-PREGNANT FEMALE:     LESS THAN 5 mIU/mL   Resp Panel by RT-PCR (Flu A&B, Covid) Nasopharyngeal Swab     Status: None   Collection Time: 04/27/21 10:39 PM   Specimen: Nasopharyngeal Swab; Nasopharyngeal(NP) swabs in vial transport medium  Result Value Ref Range   SARS Coronavirus 2 by RT PCR NEGATIVE NEGATIVE    Comment: (NOTE) SARS-CoV-2 target nucleic acids are NOT DETECTED.  The SARS-CoV-2 RNA is generally detectable in upper respiratory specimens during the acute phase of infection. The lowest concentration of SARS-CoV-2 viral  copies this assay can detect is 138 copies/mL. A negative result does not preclude  SARS-Cov-2 infection and should not be used as the sole basis for treatment or other patient management decisions. A negative result may occur with  improper specimen collection/handling, submission of specimen other than nasopharyngeal swab, presence of viral mutation(s) within the areas targeted by this assay, and inadequate number of viral copies(<138 copies/mL). A negative result must be combined with clinical observations, patient history, and epidemiological information. The expected result is Negative.  Fact Sheet for Patients:  BloggerCourse.com  Fact Sheet for Healthcare Providers:  SeriousBroker.it  This test is no t yet approved or cleared by the [place] FDA and  has been authorized for detection and/or diagnosis of SARS-CoV-2 by FDA under an Emergency Use Authorization (EUA). This EUA will remain  in effect (meaning this test can be used) for the duration of the COVID-19 declaration under Section 564(b)(1) of the Act, 21 U.S.C.section 360bbb-3(b)(1), unless the authorization is terminated  or revoked sooner.       Influenza A by PCR NEGATIVE NEGATIVE   Influenza B by PCR NEGATIVE NEGATIVE    Comment: (NOTE) The Xpert Xpress SARS-CoV-2/FLU/RSV plus assay is intended as an aid in the diagnosis of influenza from Nasopharyngeal swab specimens and should not be used as a sole basis for treatment. Nasal washings and aspirates are unacceptable for Xpert Xpress SARS-CoV-2/FLU/RSV testing.  Fact Sheet for Patients: BloggerCourse.com  Fact Sheet for Healthcare Providers: SeriousBroker.it  This test is not yet approved or cleared by the [place] FDA and has been authorized for detection and/or diagnosis of SARS-CoV-2 by FDA under an Emergency Use Authorization (EUA). This EUA will remain in effect (meaning this test can be used) for the duration of the COVID-19  declaration under Section 564(b)(1) of the Act, 21 U.S.C. section 360bbb-3(b)(1), unless the authorization is terminated or revoked.  Performed at [facility], [address], [place], [place] [address]   Lactic acid, plasma     Status: None   Collection Time: 04/27/21 10:44 PM  Result Value Ref Range   Lactic Acid, Venous 0.7 0.5 - 1.9 mmol/L    Comment: Performed at [facility], [address], [place], [place] [address]  Urinalysis, Routine w reflex microscopic Urine, Clean Catch     Status: Abnormal   Collection Time: 04/28/21 12:16 AM  Result Value Ref Range   Color, Urine YELLOW YELLOW   APPearance HAZY (A) CLEAR   Specific Gravity, Urine 1.017 1.005 - 1.030   pH 5.0 5.0 - 8.0   Glucose, UA NEGATIVE NEGATIVE mg/dL   Hgb urine dipstick LARGE (A) NEGATIVE   Bilirubin Urine NEGATIVE NEGATIVE   Ketones, ur 20 (A) NEGATIVE mg/dL   Protein, ur NEGATIVE NEGATIVE mg/dL   Nitrite NEGATIVE NEGATIVE   Leukocytes,Ua MODERATE (A) NEGATIVE   RBC / HPF >50 (H) 0 - 5 RBC/hpf   WBC, UA 21-50 0 - 5 WBC/hpf   Bacteria, UA NONE SEEN NONE SEEN   Squamous Epithelial / LPF 0-5 0 - 5   Mucus PRESENT     Comment: Performed at [facility], [address], [place], [place] [address]  Pregnancy, urine     Status: None   Collection Time: 04/28/21 12:16 AM  Result Value Ref Range   Preg Test, Ur NEGATIVE NEGATIVE    Comment: Performed at [facility], [address] [place], [place] [address]  Basic metabolic panel  Status: Abnormal   Collection Time: 04/28/21  3:00 AM  Result Value Ref Range   Sodium 136 135 - 145 mmol/L   Potassium 3.4 (L) 3.5 - 5.1 mmol/L   Chloride 107 98 - 111 mmol/L   CO2 21 (L) 22 - 32 mmol/L   Glucose, Bld 121 (H) 70 - 99 mg/dL    Comment: Glucose reference range applies only to samples taken after fasting for at least 8 hours.   BUN 5 (L) 6 - 20 mg/dL   Creatinine, Ser 7.84 0.44 - 1.00 mg/dL   Calcium 8.3 (L) 8.9 - 10.3 mg/dL   GFR,  Estimated >69 >62 mL/min    Comment: (NOTE) Calculated using the CKD-EPI Creatinine Equation (2021)    Anion gap 8 5 - 15    Comment: Performed at [facility], [address] [place], [place] [address]  CBC     Status: Abnormal   Collection Time: 04/28/21  3:00 AM  Result Value Ref Range   WBC 17.3 (H) 4.0 - 10.5 K/uL   RBC 4.07 3.87 - 5.11 MIL/uL   Hemoglobin 12.0 12.0 - 15.0 g/dL   HCT 13.2 44.0 - 10.2 %   MCV 88.5 80.0 - 100.0 fL   MCH 29.5 26.0 - 34.0 pg   MCHC 33.3 30.0 - 36.0 g/dL   RDW 72.5 36.6 - 44.0 %   Platelets 313 150 - 400 K/uL   nRBC 0.0 0.0 - 0.2 %    Comment: Performed at [facility], [address], [place], [place] [address]  Magnesium     Status: None   Collection Time: 04/28/21  3:00 AM  Result Value Ref Range   Magnesium 1.8 1.7 - 2.4 mg/dL    Comment: Performed at [facility], [address], [place], [place] [address]    CT PELVIS W CONTRAST  Result Date: 04/28/2021 CLINICAL DATA:  Concern for perianal abscess. EXAM: CT PELVIS WITH CONTRAST TECHNIQUE: Multidetector CT imaging of the pelvis was performed using the standard protocol following the bolus administration of intravenous contrast. CONTRAST:  36mL OMNIPAQUE IOHEXOL 350 MG/ML SOLN COMPARISON:  None. FINDINGS: Urinary Tract:  No abnormality visualized. Bowel:  Unremarkable visualized pelvic bowel loops. Vascular/Lymphatic: No pathologically enlarged lymph nodes. No significant vascular abnormality seen. Reproductive: The uterus is anteverted. An intrauterine device is noted. Evaluation for positioning of the IUD is very limited on this CT. However, the IUD may be slightly inferiorly displaced. Further evaluation with pelvic ultrasound on a nonemergent/outpatient basis recommended. The ovaries are unremarkable. Other: There is inflammatory changes and induration of the superficial soft tissues of the left perianal region. There is a 3.1 x 2.7 cm ill-defined low-attenuation in the left  perianal soft tissues which may represent a phlegmon or developing abscess. Ultrasound may provide better evaluation. Musculoskeletal: No suspicious bone lesions identified. IMPRESSION: Left perianal phlegmon or developing abscess. Electronically Signed   By: Elgie Collard M.D.   On: 04/28/2021 00:07    Review of Systems  HENT:  Negative for ear discharge, ear pain, hearing loss and tinnitus.   Eyes:  Negative for photophobia and pain.  Respiratory:  Negative for cough and shortness of breath.   Cardiovascular:  Negative for chest pain.  Gastrointestinal:  Positive for rectal pain. Negative for abdominal pain, nausea and vomiting.  Genitourinary:  Negative for dysuria, flank pain, frequency and urgency.  Musculoskeletal:  Negative for back pain, myalgias and neck pain.  Neurological:  Negative for dizziness  and headaches.  Hematological:  Does not bruise/bleed easily.  Psychiatric/Behavioral:  The patient is not nervous/anxious.     Blood pressure (!) 90/56, pulse 81, temperature 98.7 F (37.1 C), temperature source Oral, resp. rate 18, height 5\' 1"  (1.549 m), weight 91.2 kg, SpO2 98 %. Physical Exam Constitutional:  WDWN in NAD, conversant, no obvious deformities; lying in bed comfortably Eyes:  Pupils equal, round; sclera anicteric; moist conjunctiva; no lid lag HENT:  Oral mucosa moist; good dentition  Neck:  No masses palpated, trachea midline; no thyromegaly Lungs:  CTA bilaterally; normal respiratory effort CV:  Regular rate and rhythm; no murmurs; extremities well-perfused with no edema Abd:  +bowel sounds, soft, non-tender, no palpable organomegaly; no palpable hernias Rectal:  tender with some erythema to the left of the anus with punctate opening with purulent drainage; slight fluctuance Musc:  Unable to assess gait; no apparent clubbing or cyanosis in extremities Lymphatic:  No palpable cervical or axillary lymphadenopathy Skin:  Warm, dry; no sign of jaundice Psychiatric -  alert and oriented x 4; calm mood and affect  Assessment/Plan: Left perianal abscess Patient is not NPO, so cannot drain in OR today NPO p MN Continue abx Will reassess in AM and plan incision and drainage under anesthesia if needed.  Wilmon ArmsMatthew K Jahmere Bramel 04/28/2021, 10:49 AM

## 2021-04-28 NOTE — H&P (Addendum)
History and Physical   Chelsea Hoover LKG:[id number] DOB: 1996-07-31 DOA: 04/27/2021  PCP: Patient, No Pcp Per (Inactive)   Patient coming from: Home  Chief Complaint: Rectal discomfort  HPI: Chelsea Hoover is a 25 y.o. female with medical history significant of bipolar disorder, PTSD, anxiety, depression, IBS, Lyme disease, anal fissure presenting with rectal discomfort and some reported bleeding.  Patient reports her rectal comfort began around 4 days ago. She was evaluated by urgent care around 2 days ago and diagnosed with hemorrhoids and started Preparation H and suppositories.  She states that she had only used a couple of suppositories and noted some swelling and increased pain over the last couple days she is also had some fevers and chills since arrival to the ED.  Denies chest pain, shortness of breath, abdominal pain, constipation, diarrhea, nausea, vomiting.  ED Course: Vital signs in the ED significant for fever to 101.1, initially tachycardic to 120s which has improved to the 80s with IV fluids.  Blood pressure in the 100s to 130s systolic.  Lab work-up with CMP showing potassium 3.4.  CBC with leukocytosis to 16.8.  Lactic acid normal with repeat pending.  Respiratory panel for flu and COVID-negative.  Urinalysis and blood cultures also pending.  CT of the pelvis showed left perianal phlegmon or developing abscess.  Patient received morphine, Zosyn, Zofran and 2 L of IV fluids in the ED.  Review of Systems: As per HPI otherwise all other systems reviewed and are negative.  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   Bipolar 1 disorder (HCC)    Cannabis abuse 09/03/2015   Depression    Irritable bowel syndrome    Lyme disease    Mood disorder (HCC)    PTSD (post-traumatic stress disorder) 09/01/2015    Past Surgical History:  Procedure Laterality Date   TONSILECTOMY/ADENOIDECTOMY WITH MYRINGOTOMY Bilateral    August  2020    Social History  reports that she has never smoked. She has never used smokeless tobacco. She reports current drug use. Drug: Marijuana. She reports that she does not drink alcohol.  Allergies  Allergen Reactions   Lactose Intolerance (Gi) Other (See Comments)    Pain in stomach   Oxycodone Other (See Comments)    Intense vomiting   Latex Rash    Family History  Problem Relation Age of Onset   Crohn's disease Father    Bipolar disorder Maternal Aunt    Schizophrenia Maternal Grandfather    Bipolar disorder Maternal Grandfather   Reviewed on admission  Prior to Admission medications   Medication Sig Start Date End Date Taking? Authorizing Provider  ARIPiprazole (ABILIFY) 10 MG tablet Take 1 tablet (10 mg total) by mouth daily. 11/29/19   Aldean Baker, NP  DULoxetine (CYMBALTA) 20 MG capsule Take 1 capsule (20 mg total) by mouth daily. 11/30/19   Aldean Baker, NP  famotidine (PEPCID) 20 MG tablet Take 1 tablet (20 mg total) by mouth 2 (two) times daily. 11/29/19   Aldean Baker, NP  lamoTRIgine (LAMICTAL) 100 MG tablet Take 1 tablet (100 mg total) by mouth 2 (two) times daily. 11/29/19   Aldean Baker, NP  traZODone (DESYREL) 150 MG tablet Take 0.5 tablets (75 mg total) by mouth at bedtime. 11/29/19   Aldean Baker, NP    Physical Exam: Vitals:   04/27/21 1619 04/27/21 1954 04/27/21 2252 04/28/21 0011  BP: 137/89 102/75 107/72 119/77  Pulse: Marland Kitchen)  126 (!) 112 (!) 110 85  Resp: (!) 22 18 18 18   Temp: 99.5 F (37.5 C) (!) 101.1 F (38.4 C) 99.6 F (37.6 C) 99 F (37.2 C)  TempSrc: Oral Oral Axillary Oral  SpO2: 100% 99% 100% 99%   Physical Exam Constitutional:      General: She is not in acute distress.    Comments: Mildly diaphoretic appearing  HENT:     Head: Normocephalic and atraumatic.     Mouth/Throat:     Mouth: Mucous membranes are moist.     Pharynx: Oropharynx is clear.  Eyes:     Extraocular Movements: Extraocular movements intact.     Pupils:  Pupils are equal, round, and reactive to light.  Cardiovascular:     Rate and Rhythm: Normal rate and regular rhythm.     Pulses: Normal pulses.     Heart sounds: Normal heart sounds.  Pulmonary:     Effort: Pulmonary effort is normal. No respiratory distress.     Breath sounds: Normal breath sounds.  Abdominal:     General: Bowel sounds are normal. There is no distension.     Palpations: Abdomen is soft.     Tenderness: no abdominal tenderness  Musculoskeletal:        General: No swelling or deformity.  Skin:    General: Skin is warm and dry.  Neurological:     General: No focal deficit present.     Mental Status: Mental status is at baseline.   Labs on Admission: I have personally reviewed following labs and imaging studies  CBC: Recent Labs  Lab 04/27/21 2011  WBC 16.8*  NEUTROABS 13.5*  HGB 13.6  HCT 39.8  MCV 87.7  PLT 337    Basic Metabolic Panel: Recent Labs  Lab 04/27/21 2011  NA 136  K 3.4*  CL 103  CO2 23  GLUCOSE 90  BUN 7  CREATININE 0.99  CALCIUM 9.1    GFR: CrCl cannot be calculated (Unknown ideal weight.).  Liver Function Tests: Recent Labs  Lab 04/27/21 2011  AST 16  ALT 18  ALKPHOS 63  BILITOT 0.9  PROT 7.1  ALBUMIN 4.0    Urine analysis:    Component Value Date/Time   COLORURINE YELLOW 04/28/2021 0016   APPEARANCEUR HAZY (A) 04/28/2021 0016   LABSPEC 1.017 04/28/2021 0016   PHURINE 5.0 04/28/2021 0016   GLUCOSEU NEGATIVE 04/28/2021 0016   HGBUR LARGE (A) 04/28/2021 0016   BILIRUBINUR NEGATIVE 04/28/2021 0016   KETONESUR 20 (A) 04/28/2021 0016   PROTEINUR NEGATIVE 04/28/2021 0016   NITRITE NEGATIVE 04/28/2021 0016   LEUKOCYTESUR MODERATE (A) 04/28/2021 0016    Radiological Exams on Admission: CT PELVIS W CONTRAST  Result Date: 04/28/2021 CLINICAL DATA:  Concern for perianal abscess. EXAM: CT PELVIS WITH CONTRAST TECHNIQUE: Multidetector CT imaging of the pelvis was performed using the standard protocol following the  bolus administration of intravenous contrast. CONTRAST:  39mL OMNIPAQUE IOHEXOL 350 MG/ML SOLN COMPARISON:  None. FINDINGS: Urinary Tract:  No abnormality visualized. Bowel:  Unremarkable visualized pelvic bowel loops. Vascular/Lymphatic: No pathologically enlarged lymph nodes. No significant vascular abnormality seen. Reproductive: The uterus is anteverted. An intrauterine device is noted. Evaluation for positioning of the IUD is very limited on this CT. However, the IUD may be slightly inferiorly displaced. Further evaluation with pelvic ultrasound on a nonemergent/outpatient basis recommended. The ovaries are unremarkable. Other: There is inflammatory changes and induration of the superficial soft tissues of the left perianal region. There is a  3.1 x 2.7 cm ill-defined low-attenuation in the left perianal soft tissues which may represent a phlegmon or developing abscess. Ultrasound may provide better evaluation. Musculoskeletal: No suspicious bone lesions identified. IMPRESSION: Left perianal phlegmon or developing abscess. Electronically Signed   By: Elgie Collard M.D.   On: 04/28/2021 00:07    EKG: Not yet performed  Assessment/Plan Principal Problem:   Perianal cellulitis Active Problems:   Anxiety disorder of adolescence   PTSD (post-traumatic stress disorder)   Bipolar 1 disorder (HCC)   Depression   Perirectal abscess  Perianal cellulitis Perianal phlegmon/developing abscess > Evidence of phlegmon in the soft tissues of the perianal region.  No fully formed abscess at the time CT was performed.  > In the setting of ongoing rectal discomfort and now with fevers and chills at home.  Noted to have leukocytosis to 16.8, fever to 101.1, and initially tachycardic in the ED.  Tachycardia has improved with fluids.  Lactic acid normal. > Consider general surgery consult if patient does not improving. - We will continue on telemetry for now - Continue with IV Zosyn overnight - Trend fever  curve and white count - Follow-up urine cultures and blood cultures  Hypokalemia > Potassium mildly low at 3.4 in the ED. - We will give 20 mEq p.o. potassium - Check magnesium  Bipolar disorder PTSD Anxiety Depression - Continue home Lamictal, trazodone   DVT prophylaxis: SCDs Code Status:   Full  Family Communication:  None on admission, she states her family is up-to-date.  Disposition Plan:   Patient is from:  Home  Anticipated DC to:  Home  Anticipated DC date:  1 to 2 days  Anticipated DC barriers: None  Consults called:  None  Admission status:  Observation, telemetry   Severity of Illness: The appropriate patient status for this patient is OBSERVATION. Observation status is judged to be reasonable and necessary in order to provide the required intensity of service to ensure the patient's safety. The patient's presenting symptoms, physical exam findings, and initial radiographic and laboratory data in the context of their medical condition is felt to place them at decreased risk for further clinical deterioration. Furthermore, it is anticipated that the patient will be medically stable for discharge from the hospital within 2 midnights of admission. The following factors support the patient status of observation.   " The patient's presenting symptoms include rectal discomfort, rectal bleeding. " The physical exam findings include stable physical exam. " The initial radiographic and laboratory data are Lab work-up with CMP showing potassium 3.4.  CBC with leukocytosis to 16.8.  Lactic acid normal with repeat pending.  Respiratory panel for flu and COVID-negative.  Urinalysis and blood cultures also pending.  CT of the pelvis showed left perianal phlegmon or developing abscess.     Synetta Fail MD [facility]  How to contact the [facility] Attending or Consulting provider 7A - 7P or covering provider during after hours 7P -7A, for this patient?   Check the care team  in [facility] and look for a) attending/consulting [facility] provider listed and b) the [facility] team listed Log into [email or web address] and use [facility] universal password to access. If you do not have the password, please contact the hospital operator. Locate the [facility] provider you are looking for under [facility] and page to a number that you can be directly reached. If you still have difficulty reaching the provider, please page the [facility] (Director on Call) for the Hospitalists listed on amion for  assistance.  04/28/2021, 1:03 AM

## 2021-04-28 NOTE — Progress Notes (Signed)
PROGRESS NOTE    Jori Frerichs  POE:[id number] DOB: 31-Jul-1996 DOA: 04/27/2021 PCP: Patient, No Pcp Per (Inactive)   Brief Narrative: 25 year old with past medical history significant for bipolar disorder, PTSD, irritable bowel syndrome, history of Lyme disease who presents with 4 days of worsening tenderness and pressure in the left side of her rectum.   Patient was found to be febrile temperature 101, leukocytosis, 16, CT scanning of the pelvis showed left perianal phlegmon  or developing abscess.   Assessment & Plan:   Principal Problem:   Perianal cellulitis Active Problems:   Anxiety disorder of adolescence   PTSD (post-traumatic stress disorder)   Bipolar 1 disorder (HCC)   Depression   Perirectal abscess  1-Perianal phlegmon/developing abscess: Peri-rectal abscess CT scanning showed left perianal phlegmon or developing abscess. White blood cell increased today to 17.  Patient reported worsening pain, I have consulted surgery. Continue with IV Zosyn Blood Cultures: No growth to date.  Will order IV morphine for pain PRN.   2-Hypokalemia: Replete orally. Will give IV magnesium  Bipolar disorder, PTSD, anxiety, depression: Continue with Lamictal, trazodone resume Wellbutrin.   Estimated body mass index is 37.99 kg/m as calculated from the following:   Height as of this encounter: 5\' 1"  (1.549 m).   Weight as of this encounter: 91.2 kg.   DVT prophylaxis: SCDs Code Status: Full Code Family Communication: Care  discussed with patient.  Disposition Plan:  Status is: Observation  The patient remains OBS appropriate and will d/c before 2 midnights.  Dispo: The patient is from: Home              Anticipated d/c is to: Home              Patient currently is not medically stable to d/c.   Difficult to place patient No        Consultants:  Surgery  Procedures:  None  Antimicrobials:  IV Zosyn.   Subjective: She is complaining of worsening  perirectal pain, She think swelling is getting worse.   Objective: Vitals:   04/28/21 0011 04/28/21 0304 04/28/21 0321 04/28/21 0754  BP: 119/77 104/70 102/62 (!) 90/56  Pulse: 85 88 88 81  Resp: 18  16 18   Temp: 99 F (37.2 C) 98.5 F (36.9 C) 98.5 F (36.9 C) 98.7 F (37.1 C)  TempSrc: Oral Axillary Oral Oral  SpO2: 99% 100% 99% 98%  Weight:   91.2 kg   Height:   5\' 1"  (1.549 m)     Intake/Output Summary (Last 24 hours) at 04/28/2021 1345 Last data filed at 04/28/2021 0400 Gross per 24 hour  Intake 1000 ml  Output --  Net 1000 ml   Filed Weights   04/28/21 0321  Weight: 91.2 kg    Examination:  General exam: Appears calm and comfortable  Respiratory system: Clear to auscultation. Respiratory effort normal. Cardiovascular system: S1 & S2 heard, RRR. No JVD, murmurs, rubs, gallops or clicks. No pedal edema. Gastrointestinal system: Abdomen is nondistended, soft and nontender. Pelvis; gluteal area, peri rectum with edema, redness worse on the right Central nervous system: Alert and oriented.  Extremities: Symmetric 5 x 5 power.   Data Reviewed: I have personally reviewed following labs and imaging studies  CBC: Recent Labs  Lab 04/27/21 2011 04/28/21 0300  WBC 16.8* 17.3*  NEUTROABS 13.5*  --   HGB 13.6 12.0  HCT 39.8 36.0  MCV 87.7 88.5  PLT 337 313   Basic Metabolic Panel: Recent Labs  Lab 04/27/21 2011 04/28/21 0300  NA 136 136  K 3.4* 3.4*  CL 103 107  CO2 23 21*  GLUCOSE 90 121*  BUN 7 5*  CREATININE 0.99 0.91  CALCIUM 9.1 8.3*  MG  --  1.8   GFR: Estimated Creatinine Clearance: 99 mL/min (by C-G formula based on SCr of 0.91 mg/dL). Liver Function Tests: Recent Labs  Lab 04/27/21 2011  AST 16  ALT 18  ALKPHOS 63  BILITOT 0.9  PROT 7.1  ALBUMIN 4.0   No results for input(s): LIPASE, AMYLASE in the last 168 hours. No results for input(s): AMMONIA in the last 168 hours. Coagulation Profile: No results for input(s): INR, PROTIME in  the last 168 hours. Cardiac Enzymes: No results for input(s): CKTOTAL, CKMB, CKMBINDEX, TROPONINI in the last 168 hours. BNP (last 3 results) No results for input(s): PROBNP in the last 8760 hours. HbA1C: No results for input(s): HGBA1C in the last 72 hours. CBG: No results for input(s): GLUCAP in the last 168 hours. Lipid Profile: No results for input(s): CHOL, HDL, LDLCALC, TRIG, CHOLHDL, LDLDIRECT in the last 72 hours. Thyroid Function Tests: No results for input(s): TSH, T4TOTAL, FREET4, T3FREE, THYROIDAB in the last 72 hours. Anemia Panel: No results for input(s): VITAMINB12, FOLATE, FERRITIN, TIBC, IRON, RETICCTPCT in the last 72 hours. Sepsis Labs: Recent Labs  Lab 04/27/21 2011 04/27/21 2244  LATICACIDVEN 0.8 0.7    Recent Results (from the past 240 hour(s))  Culture, blood (routine x 2)     Status: None (Preliminary result)   Collection Time: 04/27/21  8:11 PM   Specimen: BLOOD  Result Value Ref Range Status   Specimen Description BLOOD RIGHT ARM  Final   Special Requests   Final    BOTTLES DRAWN AEROBIC AND ANAEROBIC Blood Culture results may not be optimal due to an inadequate volume of blood received in culture bottles   Culture   Final    NO GROWTH < 24 HOURS Performed at [facility], [address], [place], [place] [address]    Report Status PENDING  Incomplete  Culture, blood (routine x 2)     Status: None (Preliminary result)   Collection Time: 04/27/21  8:17 PM   Specimen: BLOOD  Result Value Ref Range Status   Specimen Description BLOOD RIGHT HAND  Final   Special Requests   Final    BOTTLES DRAWN AEROBIC ONLY Blood Culture results may not be optimal due to an inadequate volume of blood received in culture bottles   Culture   Final    NO GROWTH < 24 HOURS Performed at [facility], [address] [place], [place] [address]    Report Status PENDING  Incomplete  Resp Panel by RT-PCR (Flu A&B, Covid) Nasopharyngeal Swab     Status: None    Collection Time: 04/27/21 10:39 PM   Specimen: Nasopharyngeal Swab; Nasopharyngeal(NP) swabs in vial transport medium  Result Value Ref Range Status   SARS Coronavirus 2 by RT PCR NEGATIVE NEGATIVE Final    Comment: (NOTE) SARS-CoV-2 target nucleic acids are NOT DETECTED.  The SARS-CoV-2 RNA is generally detectable in upper respiratory specimens during the acute phase of infection. The lowest concentration of SARS-CoV-2 viral copies this assay can detect is 138 copies/mL. A negative result does not preclude SARS-Cov-2 infection and should not be used as the sole basis for treatment or other patient management decisions. A negative result may occur with  improper specimen collection/handling, submission of specimen other  than nasopharyngeal swab, presence of viral mutation(s) within the areas targeted by this assay, and inadequate number of viral copies(<138 copies/mL). A negative result must be combined with clinical observations, patient history, and epidemiological information. The expected result is Negative.  Fact Sheet for Patients:  BloggerCourse.com  Fact Sheet for Healthcare Providers:  SeriousBroker.it  This test is no t yet approved or cleared by the [place] FDA and  has been authorized for detection and/or diagnosis of SARS-CoV-2 by FDA under an Emergency Use Authorization (EUA). This EUA will remain  in effect (meaning this test can be used) for the duration of the COVID-19 declaration under Section 564(b)(1) of the Act, 21 U.S.C.section 360bbb-3(b)(1), unless the authorization is terminated  or revoked sooner.       Influenza A by PCR NEGATIVE NEGATIVE Final   Influenza B by PCR NEGATIVE NEGATIVE Final    Comment: (NOTE) The Xpert Xpress SARS-CoV-2/FLU/RSV plus assay is intended as an aid in the diagnosis of influenza from Nasopharyngeal swab specimens and should not be used as a sole basis for treatment.  Nasal washings and aspirates are unacceptable for Xpert Xpress SARS-CoV-2/FLU/RSV testing.  Fact Sheet for Patients: BloggerCourse.com  Fact Sheet for Healthcare Providers: SeriousBroker.it  This test is not yet approved or cleared by the [place] FDA and has been authorized for detection and/or diagnosis of SARS-CoV-2 by FDA under an Emergency Use Authorization (EUA). This EUA will remain in effect (meaning this test can be used) for the duration of the COVID-19 declaration under Section 564(b)(1) of the Act, 21 U.S.C. section 360bbb-3(b)(1), unless the authorization is terminated or revoked.  Performed at [facility], [address], [place], [place] [address]          Radiology Studies: CT PELVIS W CONTRAST  Result Date: 04/28/2021 CLINICAL DATA:  Concern for perianal abscess. EXAM: CT PELVIS WITH CONTRAST TECHNIQUE: Multidetector CT imaging of the pelvis was performed using the standard protocol following the bolus administration of intravenous contrast. CONTRAST:  50mL OMNIPAQUE IOHEXOL 350 MG/ML SOLN COMPARISON:  None. FINDINGS: Urinary Tract:  No abnormality visualized. Bowel:  Unremarkable visualized pelvic bowel loops. Vascular/Lymphatic: No pathologically enlarged lymph nodes. No significant vascular abnormality seen. Reproductive: The uterus is anteverted. An intrauterine device is noted. Evaluation for positioning of the IUD is very limited on this CT. However, the IUD may be slightly inferiorly displaced. Further evaluation with pelvic ultrasound on a nonemergent/outpatient basis recommended. The ovaries are unremarkable. Other: There is inflammatory changes and induration of the superficial soft tissues of the left perianal region. There is a 3.1 x 2.7 cm ill-defined low-attenuation in the left perianal soft tissues which may represent a phlegmon or developing abscess. Ultrasound may provide better evaluation.  Musculoskeletal: No suspicious bone lesions identified. IMPRESSION: Left perianal phlegmon or developing abscess. Electronically Signed   By: Elgie Collard M.D.   On: 04/28/2021 00:07        Scheduled Meds:  buPROPion  100 mg Oral q morning   famotidine  20 mg Oral BID   lamoTRIgine  100 mg Oral BID   sodium chloride flush  3 mL Intravenous Q12H   traZODone  75 mg Oral QHS   Continuous Infusions:  piperacillin-tazobactam (ZOSYN)  IV 3.375 g (04/28/21 1342)     LOS: 0 days    Time spent: 35 minutes.     Alba Cory, MD [facility]   If 7PM-7AM, please contact night-coverage [email or web address]  04/28/2021, 1:45 PM

## 2021-04-29 ENCOUNTER — Inpatient Hospital Stay (HOSPITAL_COMMUNITY): Payer: Self-pay | Admitting: Certified Registered Nurse Anesthetist

## 2021-04-29 ENCOUNTER — Encounter (HOSPITAL_COMMUNITY): Payer: Self-pay | Admitting: Internal Medicine

## 2021-04-29 ENCOUNTER — Encounter (HOSPITAL_COMMUNITY)
Admission: EM | Disposition: A | Discharge status: Home / Self Care | Payer: Self-pay | Source: Home / Self Care | Attending: Internal Medicine

## 2021-04-29 HISTORY — PX: INCISION AND DRAINAGE PERIRECTAL ABSCESS: SHX1804

## 2021-04-29 LAB — BASIC METABOLIC PANEL
Anion gap: 9 (ref 5–15)
BUN: 5 mg/dL — ABNORMAL LOW (ref 6–20)
CO2: 21 mmol/L — ABNORMAL LOW (ref 22–32)
Calcium: 8.5 mg/dL — ABNORMAL LOW (ref 8.9–10.3)
Chloride: 106 mmol/L (ref 98–111)
Creatinine, Ser: 0.86 mg/dL (ref 0.44–1.00)
GFR, Estimated: >60 mL/min (ref >60)
Glucose, Bld: 96 mg/dL (ref 70–99)
Potassium: 4.3 mmol/L (ref 3.5–5.1)
Sodium: 136 mmol/L (ref 135–145)

## 2021-04-29 LAB — CBC
HCT: 36.8 % (ref 36.0–46.0)
Hemoglobin: 12.4 g/dL (ref 12.0–15.0)
MCH: 29.7 pg (ref 26.0–34.0)
MCHC: 33.7 g/dL (ref 30.0–36.0)
MCV: 88 fL (ref 80.0–100.0)
Platelets: 351 10*3/uL (ref 150–400)
RBC: 4.18 MIL/uL (ref 3.87–5.11)
RDW: 12.1 % (ref 11.5–15.5)
WBC: 18.9 10*3/uL — ABNORMAL HIGH (ref 4.0–10.5)
nRBC: 0 % (ref 0.0–0.2)

## 2021-04-29 SURGERY — INCISION AND DRAINAGE, ABSCESS, PERIRECTAL
Anesthesia: General

## 2021-04-29 MED ORDER — FENTANYL CITRATE (PF) 250 MCG/5ML IJ SOLN
INTRAMUSCULAR | Status: DC | PRN
  Administered 2021-04-29 (×3): 50 ug via INTRAVENOUS

## 2021-04-29 MED ORDER — PROPOFOL 10 MG/ML IV BOLUS
INTRAVENOUS | Status: AC
  Filled 2021-04-29: qty 20

## 2021-04-29 MED ORDER — HYDROMORPHONE HCL 1 MG/ML IJ SOLN: 0.2500 mg | INTRAMUSCULAR | Status: DC | PRN

## 2021-04-29 MED ORDER — PHENYLEPHRINE 40 MCG/ML (10ML) SYRINGE FOR IV PUSH (FOR BLOOD PRESSURE SUPPORT)
PREFILLED_SYRINGE | INTRAVENOUS | Status: AC
  Filled 2021-04-29: qty 10

## 2021-04-29 MED ORDER — 0.9 % SODIUM CHLORIDE (POUR BTL) OPTIME
TOPICAL | Status: DC | PRN
  Administered 2021-04-29: 1000 mL

## 2021-04-29 MED ORDER — SUCCINYLCHOLINE CHLORIDE 200 MG/10ML IV SOSY
PREFILLED_SYRINGE | INTRAVENOUS | Status: AC
  Filled 2021-04-29: qty 10

## 2021-04-29 MED ORDER — ONDANSETRON HCL 4 MG/2ML IJ SOLN
INTRAMUSCULAR | Status: DC | PRN
  Administered 2021-04-29: 4 mg via INTRAVENOUS

## 2021-04-29 MED ORDER — DEXAMETHASONE SODIUM PHOSPHATE 10 MG/ML IJ SOLN
INTRAMUSCULAR | Status: DC | PRN
Start: 2021-04-29 — End: 2021-04-29
  Administered 2021-04-29: 5 mg via INTRAVENOUS

## 2021-04-29 MED ORDER — RINGERS IV SOLN: INTRAVENOUS | Status: DC

## 2021-04-29 MED ORDER — CHLORHEXIDINE GLUCONATE 0.12 % MT SOLN
OROMUCOSAL | Status: AC
  Administered 2021-04-29: 15 mL via OROMUCOSAL
  Filled 2021-04-29: qty 15

## 2021-04-29 MED ORDER — LIDOCAINE 2% (20 MG/ML) 5 ML SYRINGE
INTRAMUSCULAR | Status: AC
  Filled 2021-04-29: qty 10

## 2021-04-29 MED ORDER — FENTANYL CITRATE (PF) 250 MCG/5ML IJ SOLN
INTRAMUSCULAR | Status: AC
  Filled 2021-04-29: qty 5

## 2021-04-29 MED ORDER — LACTATED RINGERS IV SOLN: INTRAVENOUS | Status: DC

## 2021-04-29 MED ORDER — CHLORHEXIDINE GLUCONATE 0.12 % MT SOLN: 15.0000 mL | Freq: Once | OROMUCOSAL | Status: AC

## 2021-04-29 MED ORDER — MEPERIDINE HCL 25 MG/ML IJ SOLN: 6.2500 mg | INTRAMUSCULAR | Status: DC | PRN

## 2021-04-29 MED ORDER — SUGAMMADEX SODIUM 200 MG/2ML IV SOLN
INTRAVENOUS | Status: DC | PRN
  Administered 2021-04-29: 400 mg via INTRAVENOUS

## 2021-04-29 MED ORDER — ROCURONIUM BROMIDE 10 MG/ML (PF) SYRINGE
PREFILLED_SYRINGE | INTRAVENOUS | Status: DC | PRN
  Administered 2021-04-29: 50 mg via INTRAVENOUS

## 2021-04-29 MED ORDER — SODIUM CHLORIDE 0.9 % IV SOLN: INTRAVENOUS | Status: DC

## 2021-04-29 MED ORDER — PROPOFOL 10 MG/ML IV BOLUS
INTRAVENOUS | Status: DC | PRN
  Administered 2021-04-29: 200 mg via INTRAVENOUS

## 2021-04-29 MED ORDER — LACTATED RINGERS IV BOLUS: 500.0000 mL | Freq: Once | INTRAVENOUS | Status: DC

## 2021-04-29 MED ORDER — MIDAZOLAM HCL 2 MG/2ML IJ SOLN
INTRAMUSCULAR | Status: DC | PRN
  Administered 2021-04-29: 1 mg via INTRAVENOUS

## 2021-04-29 MED ORDER — LIDOCAINE 2% (20 MG/ML) 5 ML SYRINGE
INTRAMUSCULAR | Status: DC | PRN
  Administered 2021-04-29: 100 mg via INTRAVENOUS

## 2021-04-29 MED ORDER — MIDAZOLAM HCL 2 MG/2ML IJ SOLN
INTRAMUSCULAR | Status: AC
  Filled 2021-04-29: qty 2

## 2021-04-29 MED ORDER — PROMETHAZINE HCL 25 MG/ML IJ SOLN: 6.2500 mg | INTRAMUSCULAR | Status: DC | PRN

## 2021-04-29 MED ORDER — ONDANSETRON HCL 4 MG/2ML IJ SOLN
INTRAMUSCULAR | Status: AC
  Filled 2021-04-29: qty 2

## 2021-04-29 SURGICAL SUPPLY — 25 items
BAG COUNTER SPONGE SURGICOUNT (BAG) ×2 IMPLANT
CANISTER SUCT 3000ML PPV (MISCELLANEOUS) ×2 IMPLANT
COVER SURGICAL LIGHT HANDLE (MISCELLANEOUS) ×2 IMPLANT
DRSG PAD ABDOMINAL 8X10 ST (GAUZE/BANDAGES/DRESSINGS) ×2 IMPLANT
ELECT REM PT RETURN 9FT ADLT (ELECTROSURGICAL) ×2
ELECTRODE REM PT RTRN 9FT ADLT (ELECTROSURGICAL) ×1 IMPLANT
GAUZE PACKING IODOFORM 1X5 (PACKING) ×2 IMPLANT
GAUZE SPONGE 4X4 12PLY STRL (GAUZE/BANDAGES/DRESSINGS) ×2 IMPLANT
GLOVE SRG 8 PF TXTR STRL LF DI (GLOVE) ×1 IMPLANT
GLOVE SURG UNDER POLY LF SZ8 (GLOVE) ×1
GOWN STRL REUS W/ TWL XL LVL3 (GOWN DISPOSABLE) ×2 IMPLANT
GOWN STRL REUS W/TWL XL LVL3 (GOWN DISPOSABLE) ×2
KIT BASIN OR (CUSTOM PROCEDURE TRAY) ×2 IMPLANT
KIT TURNOVER KIT B (KITS) ×2 IMPLANT
NS IRRIG 1000ML POUR BTL (IV SOLUTION) ×2 IMPLANT
PACK LITHOTOMY IV (CUSTOM PROCEDURE TRAY) ×2 IMPLANT
PAD ARMBOARD 7.5X6 YLW CONV (MISCELLANEOUS) ×4 IMPLANT
PENCIL SMOKE EVACUATOR (MISCELLANEOUS) ×2 IMPLANT
SWAB COLLECTION DEVICE MRSA (MISCELLANEOUS) ×2 IMPLANT
SWAB CULTURE ESWAB REG 1ML (MISCELLANEOUS) ×2 IMPLANT
SYR BULB IRRIG 60ML STRL (SYRINGE) ×2 IMPLANT
TOWEL GREEN STERILE (TOWEL DISPOSABLE) ×2 IMPLANT
TUBE CONNECTING 12X1/4 (SUCTIONS) ×2 IMPLANT
UNDERPAD 30X36 HEAVY ABSORB (UNDERPADS AND DIAPERS) ×2 IMPLANT
YANKAUER SUCT BULB TIP NO VENT (SUCTIONS) ×2 IMPLANT

## 2021-04-29 NOTE — Progress Notes (Signed)
PROGRESS NOTE    Chelsea Hoover  ZOX:[id number] DOB: 06/29/1996 DOA: 04/27/2021 PCP: Patient, No Pcp Per (Inactive)   Brief Narrative: 25 year old with past medical history significant for bipolar disorder, PTSD, irritable bowel syndrome, history of Lyme disease who presents with 4 days of worsening tenderness and pressure in the left side of her rectum.   Patient was found to be febrile temperature 101, leukocytosis, 16, CT scanning of the pelvis showed left perianal phlegmon  or developing abscess.   Assessment & Plan:   Principal Problem:   Perianal cellulitis Active Problems:   Anxiety disorder of adolescence   PTSD (post-traumatic stress disorder)   Bipolar 1 disorder (HCC)   Depression   Perirectal abscess  1-Perianal phlegmon/developing abscess: Peri-rectal abscess CT scanning showed left perianal phlegmon or developing abscess. White blood cell increased today to 17.  Patient reported worsening pain, I have consulted surgery. Continue with IV Zosyn Blood Cultures: No growth to date.  Will order IV morphine for pain PRN.  IV fluids.  For I and D in the OR today.  WBC increase 18.  2-Hypokalemia: Replete orally. Received IV mag.    Bipolar disorder, PTSD, anxiety, depression: Continue with Lamictal, trazodone resume Wellbutrin.   Estimated body mass index is 37.99 kg/m as calculated from the following:   Height as of this encounter: 5\' 1"  (1.549 m).   Weight as of this encounter: 91.2 kg.   DVT prophylaxis: SCDs Code Status: Full Code Family Communication: Care  discussed with patient. , try to contact patient 's mother, didn't answer. Will try to her later.   Disposition Plan:  Status is: Observation  The patient remains OBS appropriate and will d/c before 2 midnights.  Dispo: The patient is from: Home              Anticipated d/c is to: Home              Patient currently is not medically stable to d/c.   Difficult to place patient  No        Consultants:  Surgery  Procedures:  None  Antimicrobials:  IV Zosyn.   Subjective: I saw patient prior to surgery. She was alert, complaining of pain perirectal.  She denies dyspnea.    Objective: Vitals:   04/28/21 2121 04/29/21 0733 04/29/21 1143 04/29/21 1146  BP: 108/63 99/64  (!) 86/52  Pulse: (!) 102 84    Resp: 18 14  18   Temp: 99.4 F (37.4 C) 98.8 F (37.1 C)  98.6 F (37 C)  TempSrc: Oral Oral  Oral  SpO2: 98% 98%  95%  Weight:   91.2 kg   Height:        Intake/Output Summary (Last 24 hours) at 04/29/2021 1330 Last data filed at 04/29/2021 1326 Gross per 24 hour  Intake 950 ml  Output 170 ml  Net 780 ml    Filed Weights   04/28/21 0321 04/29/21 1143  Weight: 91.2 kg 91.2 kg    Examination:  General exam: NAD Respiratory system: CTA Cardiovascular system: S 1, S 2 RRR Gastrointestinal system: BS present, soft, nt Pelvis; gluteal area, peri rectum with edema, redness worse on the right Central nervous system: alert Extremities: no edema   Data Reviewed: I have personally reviewed following labs and imaging studies  CBC: Recent Labs  Lab 04/27/21 2011 04/28/21 0300 04/29/21 0208  WBC 16.8* 17.3* 18.9*  NEUTROABS 13.5*  --   --   HGB 13.6 12.0 12.4  HCT 39.8  36.0 36.8  MCV 87.7 88.5 88.0  PLT 337 313 351    Basic Metabolic Panel: Recent Labs  Lab 04/27/21 2011 04/28/21 0300 04/29/21 0208  NA 136 136 136  K 3.4* 3.4* 4.3  CL 103 107 106  CO2 23 21* 21*  GLUCOSE 90 121* 96  BUN 7 5* 5*  CREATININE 0.99 0.91 0.86  CALCIUM 9.1 8.3* 8.5*  MG  --  1.8  --     GFR: Estimated Creatinine Clearance: 104.7 mL/min (by C-G formula based on SCr of 0.86 mg/dL). Liver Function Tests: Recent Labs  Lab 04/27/21 2011  AST 16  ALT 18  ALKPHOS 63  BILITOT 0.9  PROT 7.1  ALBUMIN 4.0    No results for input(s): LIPASE, AMYLASE in the last 168 hours. No results for input(s): AMMONIA in the last 168 hours. Coagulation  Profile: No results for input(s): INR, PROTIME in the last 168 hours. Cardiac Enzymes: No results for input(s): CKTOTAL, CKMB, CKMBINDEX, TROPONINI in the last 168 hours. BNP (last 3 results) No results for input(s): PROBNP in the last 8760 hours. HbA1C: No results for input(s): HGBA1C in the last 72 hours. CBG: No results for input(s): GLUCAP in the last 168 hours. Lipid Profile: No results for input(s): CHOL, HDL, LDLCALC, TRIG, CHOLHDL, LDLDIRECT in the last 72 hours. Thyroid Function Tests: No results for input(s): TSH, T4TOTAL, FREET4, T3FREE, THYROIDAB in the last 72 hours. Anemia Panel: No results for input(s): VITAMINB12, FOLATE, FERRITIN, TIBC, IRON, RETICCTPCT in the last 72 hours. Sepsis Labs: Recent Labs  Lab 04/27/21 2011 04/27/21 2244  LATICACIDVEN 0.8 0.7     Recent Results (from the past 240 hour(s))  Culture, blood (routine x 2)     Status: None (Preliminary result)   Collection Time: 04/27/21  8:11 PM   Specimen: BLOOD  Result Value Ref Range Status   Specimen Description BLOOD RIGHT ARM  Final   Special Requests   Final    BOTTLES DRAWN AEROBIC AND ANAEROBIC Blood Culture results may not be optimal due to an inadequate volume of blood received in culture bottles   Culture   Final    NO GROWTH < 24 HOURS Performed at [facility], [address], [place], [place] [address]    Report Status PENDING  Incomplete  Culture, blood (routine x 2)     Status: None (Preliminary result)   Collection Time: 04/27/21  8:17 PM   Specimen: BLOOD  Result Value Ref Range Status   Specimen Description BLOOD RIGHT HAND  Final   Special Requests   Final    BOTTLES DRAWN AEROBIC ONLY Blood Culture results may not be optimal due to an inadequate volume of blood received in culture bottles   Culture   Final    NO GROWTH < 24 HOURS Performed at [facility], [address], [place], [place] [address]    Report Status PENDING  Incomplete  Resp Panel by RT-PCR  (Flu A&B, Covid) Nasopharyngeal Swab     Status: None   Collection Time: 04/27/21 10:39 PM   Specimen: Nasopharyngeal Swab; Nasopharyngeal(NP) swabs in vial transport medium  Result Value Ref Range Status   SARS Coronavirus 2 by RT PCR NEGATIVE NEGATIVE Final    Comment: (NOTE) SARS-CoV-2 target nucleic acids are NOT DETECTED.  The SARS-CoV-2 RNA is generally detectable in upper respiratory specimens during the acute phase of infection. The lowest concentration of SARS-CoV-2 viral copies this assay can detect is 138 copies/mL. A  negative result does not preclude SARS-Cov-2 infection and should not be used as the sole basis for treatment or other patient management decisions. A negative result may occur with  improper specimen collection/handling, submission of specimen other than nasopharyngeal swab, presence of viral mutation(s) within the areas targeted by this assay, and inadequate number of viral copies(<138 copies/mL). A negative result must be combined with clinical observations, patient history, and epidemiological information. The expected result is Negative.  Fact Sheet for Patients:  BloggerCourse.com  Fact Sheet for Healthcare Providers:  SeriousBroker.it  This test is no t yet approved or cleared by the [place] FDA and  has been authorized for detection and/or diagnosis of SARS-CoV-2 by FDA under an Emergency Use Authorization (EUA). This EUA will remain  in effect (meaning this test can be used) for the duration of the COVID-19 declaration under Section 564(b)(1) of the Act, 21 U.S.C.section 360bbb-3(b)(1), unless the authorization is terminated  or revoked sooner.       Influenza A by PCR NEGATIVE NEGATIVE Final   Influenza B by PCR NEGATIVE NEGATIVE Final    Comment: (NOTE) The Xpert Xpress SARS-CoV-2/FLU/RSV plus assay is intended as an aid in the diagnosis of influenza from Nasopharyngeal swab specimens  and should not be used as a sole basis for treatment. Nasal washings and aspirates are unacceptable for Xpert Xpress SARS-CoV-2/FLU/RSV testing.  Fact Sheet for Patients: BloggerCourse.com  Fact Sheet for Healthcare Providers: SeriousBroker.it  This test is not yet approved or cleared by the [place] FDA and has been authorized for detection and/or diagnosis of SARS-CoV-2 by FDA under an Emergency Use Authorization (EUA). This EUA will remain in effect (meaning this test can be used) for the duration of the COVID-19 declaration under Section 564(b)(1) of the Act, 21 U.S.C. section 360bbb-3(b)(1), unless the authorization is terminated or revoked.  Performed at [facility], [address], [place], [place] [address]   Surgical pcr screen     Status: None   Collection Time: 04/28/21  5:55 PM   Specimen: Nasal Mucosa; Nasal Swab  Result Value Ref Range Status   MRSA, PCR NEGATIVE NEGATIVE Final   Staphylococcus aureus NEGATIVE NEGATIVE Final    Comment: (NOTE) The Xpert SA Assay (FDA approved for NASAL specimens in patients 54 years of age and older), is one component of a comprehensive surveillance program. It is not intended to diagnose infection nor to guide or monitor treatment. Performed at [facility], [address] [place], [place] [address]           Radiology Studies: CT PELVIS W CONTRAST  Result Date: 04/28/2021 CLINICAL DATA:  Concern for perianal abscess. EXAM: CT PELVIS WITH CONTRAST TECHNIQUE: Multidetector CT imaging of the pelvis was performed using the standard protocol following the bolus administration of intravenous contrast. CONTRAST:  51mL OMNIPAQUE IOHEXOL 350 MG/ML SOLN COMPARISON:  None. FINDINGS: Urinary Tract:  No abnormality visualized. Bowel:  Unremarkable visualized pelvic bowel loops. Vascular/Lymphatic: No pathologically enlarged lymph nodes. No significant vascular  abnormality seen. Reproductive: The uterus is anteverted. An intrauterine device is noted. Evaluation for positioning of the IUD is very limited on this CT. However, the IUD may be slightly inferiorly displaced. Further evaluation with pelvic ultrasound on a nonemergent/outpatient basis recommended. The ovaries are unremarkable. Other: There is inflammatory changes and induration of the superficial soft tissues of the left perianal region. There is a 3.1 x 2.7 cm ill-defined low-attenuation in the left perianal soft tissues which may represent a  phlegmon or developing abscess. Ultrasound may provide better evaluation. Musculoskeletal: No suspicious bone lesions identified. IMPRESSION: Left perianal phlegmon or developing abscess. Electronically Signed   By: Elgie Collard M.D.   On: 04/28/2021 00:07        Scheduled Meds:  [MAR Hold] buPROPion  100 mg Oral q morning   [MAR Hold] famotidine  20 mg Oral BID   [MAR Hold] lamoTRIgine  100 mg Oral BID   [MAR Hold] sodium chloride flush  3 mL Intravenous Q12H   [MAR Hold] traZODone  75 mg Oral QHS   Continuous Infusions:  lactated ringers 0 mL (04/29/21 1146)   lactated ringers 10 mL/hr at 04/29/21 1146   [MAR Hold] piperacillin-tazobactam (ZOSYN)  IV 3.375 g (04/29/21 0526)     LOS: 0 days    Time spent: 35 minutes.     Alba Cory, MD [facility]   If 7PM-7AM, please contact night-coverage [email or web address]  04/29/2021, 1:30 PM

## 2021-04-29 NOTE — Anesthesia Procedure Notes (Signed)
Procedure Name: Intubation Date/Time: 04/29/2021 12:40 PM Performed by: Inda Coke, CRNA Pre-anesthesia Checklist: Patient identified, Emergency Drugs available, Suction available and Patient being monitored Patient Re-evaluated:Patient Re-evaluated prior to induction Oxygen Delivery Method: Circle System Utilized Preoxygenation: Pre-oxygenation with 100% oxygen Induction Type: IV induction Ventilation: Mask ventilation without difficulty Laryngoscope Size: Mac and 3 Grade View: Grade I Tube type: Oral Tube size: 7.0 mm Number of attempts: 1 Airway Equipment and Method: Stylet and Oral airway Placement Confirmation: ETT inserted through vocal cords under direct vision, positive ETCO2 and breath sounds checked- equal and bilateral Secured at: 20 cm Tube secured with: Tape Dental Injury: Teeth and Oropharynx as per pre-operative assessment

## 2021-04-29 NOTE — Progress Notes (Signed)
Pt seen in room from PACU, dressing CDI s/p I&D perianal abscess, pt alert/oriented in no appparent distress.Mom at bedside.

## 2021-04-29 NOTE — Anesthesia Postprocedure Evaluation (Signed)
Anesthesia Post Note  Patient: Chelsea Hoover  Procedure(s) Performed: IRRIGATION AND DEBRIDEMENT PERIRECTAL ABSCESS     Patient location during evaluation: PACU Anesthesia Type: General Level of consciousness: sedated and patient cooperative Pain management: pain level controlled Vital Signs Assessment: post-procedure vital signs reviewed and stable Respiratory status: spontaneous breathing Cardiovascular status: stable Anesthetic complications: no   No notable events documented.  Last Vitals:  Vitals:   04/29/21 1400 04/29/21 1430  BP: 111/71 111/72  Pulse: 96 87  Resp: (!) 22 17  Temp: 36.5 C 37.5 C  SpO2: 92% 93%    Last Pain:  Vitals:   04/29/21 1430  TempSrc: Oral  PainSc:                  Lewie Loron

## 2021-04-29 NOTE — Transfer of Care (Signed)
Immediate Anesthesia Transfer of Care Note  Patient: Chelsea Hoover  Procedure(s) Performed: IRRIGATION AND DEBRIDEMENT PERIRECTAL ABSCESS  Patient Location: PACU  Anesthesia Type:General  Level of Consciousness: awake, alert  and oriented  Airway & Oxygen Therapy: Patient Spontanous Breathing and Patient connected to nasal cannula oxygen  Post-op Assessment: Report given to RN and Post -op Vital signs reviewed and stable  Post vital signs: Reviewed and stable  Last Vitals:  Vitals Value Taken Time  BP 115/73 04/29/21 1330  Temp 36.5 C 04/29/21 1330  Pulse 98 04/29/21 1332  Resp 28 04/29/21 1332  SpO2 94 % 04/29/21 1332  Vitals shown include unvalidated device data.  Last Pain:  Vitals:   04/29/21 1146  TempSrc: Oral  PainSc:          Complications: No notable events documented.

## 2021-04-29 NOTE — Anesthesia Preprocedure Evaluation (Addendum)
Anesthesia Evaluation  Patient identified by MRN, date of birth, ID band Patient awake    Reviewed: Allergy & Precautions, NPO status , Patient's Chart, lab work & pertinent test results  Airway Mallampati: II  TM Distance: >3 FB Neck ROM: Full    Dental  (+) Dental Advisory Given   Pulmonary neg pulmonary ROS,    Pulmonary exam normal breath sounds clear to auscultation       Cardiovascular negative cardio ROS Normal cardiovascular exam Rhythm:Regular Rate:Normal     Neuro/Psych PSYCHIATRIC DISORDERS Anxiety Depression Bipolar Disorder negative neurological ROS     GI/Hepatic negative GI ROS, Neg liver ROS,   Endo/Other  negative endocrine ROS  Renal/GU negative Renal ROS     Musculoskeletal negative musculoskeletal ROS (+)   Abdominal (+) + obese,   Peds  Hematology negative hematology ROS (+)   Anesthesia Other Findings   Reproductive/Obstetrics                            Anesthesia Physical Anesthesia Plan  ASA: 3  Anesthesia Plan: General   Post-op Pain Management:    Induction: Intravenous  PONV Risk Score and Plan: 4 or greater and Ondansetron, Dexamethasone, Midazolam, Treatment may vary due to age or medical condition and Diphenhydramine  Airway Management Planned: Oral ETT  Additional Equipment:   Intra-op Plan:   Post-operative Plan: Extubation in OR  Informed Consent: I have reviewed the patients History and Physical, chart, labs and discussed the procedure including the risks, benefits and alternatives for the proposed anesthesia with the patient or authorized representative who has indicated his/her understanding and acceptance.     Dental advisory given  Plan Discussed with: CRNA  Anesthesia Plan Comments:        Anesthesia Quick Evaluation

## 2021-04-29 NOTE — Op Note (Signed)
  04/27/2021 - 04/29/2021  1:15 PM  PATIENT:  Chelsea Hoover  25 y.o. female  PRE-OPERATIVE DIAGNOSIS:  PERIANAL ABSCESS  POST-OPERATIVE DIAGNOSIS:  PERIANAL ABSCESS  PROCEDURE:  Procedure(s): INCISION AND DRAINAGE PERIANAL ABSCESS  SURGEON:  Surgeon(s): Violeta Gelinas, MD  ASSISTANTS: none   ANESTHESIA:   general  EBL:  Total I/O In: 50 [IV Piggyback:50] Out: -   BLOOD ADMINISTERED:none  DRAINS: none   SPECIMEN:  No Specimen  DISPOSITION OF SPECIMEN:  N/A  COUNTS:  YES  DICTATION: .Dragon Dictation Findings: Left perianal abscess with at least 25 cc of pus  Procedure in detail: Informed consent was obtained.  She is on IV antibiotics.  She was brought to the operating room and general endotracheal anesthesia was administered by the anesthesia staff.  She was placed in lithotomy position.  Her perianal region was prepped and draped in a sterile fashion.  We did a timeout procedure.  Examination revealed a fluctuant area around the 2 o'clock position in her left perianal area.  I made an incision over this region using cautery.  I then dissected into a cavity containing a large amount of pus, at least 25 cc.  This was sent for cultures.  The area was thoroughly evacuated and then irrigated out with saline.  I achieved hemostasis with the Bovie cautery and then I packed the abscess cavity with 1 inch iodoform gauze.  We placed a bulky sterile gauze dressing.  All counts were correct.  She tolerated the procedure well without apparent complication and was taken recovery in stable condition. PATIENT DISPOSITION:  PACU - hemodynamically stable.   Delay start of Pharmacological VTE agent (>24hrs) due to surgical blood loss or risk of bleeding:  no  Violeta Gelinas, MD, MPH, FACS Pager: [phone]  8/28/20221:15 PM

## 2021-04-29 NOTE — Progress Notes (Signed)
Patient ID: Chelsea Hoover, female   DOB: 16-Nov-1995, 25 y.o.   MRN: [id number] Patient seen. I agree with Dr. Corliss Skains. Will proceed with incision and drainage of perianal abscess. I discussed the procedure, risks, and benefits as well. She agrees. Violeta Gelinas, MD, MPH, FACS Please use [email or web address] to contact on call provider

## 2021-04-29 NOTE — Progress Notes (Signed)
Subjective/Chief Complaint: Patient is NPO Worsening left perianal pain WBC increased Afebrile   Objective: Vital signs in last 24 hours: Temp:  [98.7 F (37.1 C)-99.4 F (37.4 C)] 98.8 F (37.1 C) (08/28 0733) Pulse Rate:  [81-102] 84 (08/28 0733) Resp:  [14-18] 14 (08/28 0733) BP: (90-108)/(56-64) 99/64 (08/28 0733) SpO2:  [98 %-99 %] 98 % (08/28 0733) Last BM Date: 04/26/21  Intake/Output from previous day: 08/27 0701 - 08/28 0700 In: 100 [IV Piggyback:100] Out: 350 [Urine:350] Intake/Output this shift: No intake/output data recorded.  Left perianal region - exquisitely tender; firm; punctate opening with some purulent drainage; too tender for digital examination; no right-sided tenderness  Lab Results:  Recent Labs    04/28/21 0300 04/29/21 0208  WBC 17.3* 18.9*  HGB 12.0 12.4  HCT 36.0 36.8  PLT 313 351   BMET Recent Labs    04/28/21 0300 04/29/21 0208  NA 136 136  K 3.4* 4.3  CL 107 106  CO2 21* 21*  GLUCOSE 121* 96  BUN 5* 5*  CREATININE 0.91 0.86  CALCIUM 8.3* 8.5*   PT/INR No results for input(s): LABPROT, INR in the last 72 hours. ABG No results for input(s): PHART, HCO3 in the last 72 hours.  Invalid input(s): PCO2, PO2  Studies/Results: CT PELVIS W CONTRAST  Result Date: 04/28/2021 CLINICAL DATA:  Concern for perianal abscess. EXAM: CT PELVIS WITH CONTRAST TECHNIQUE: Multidetector CT imaging of the pelvis was performed using the standard protocol following the bolus administration of intravenous contrast. CONTRAST:  30mL OMNIPAQUE IOHEXOL 350 MG/ML SOLN COMPARISON:  None. FINDINGS: Urinary Tract:  No abnormality visualized. Bowel:  Unremarkable visualized pelvic bowel loops. Vascular/Lymphatic: No pathologically enlarged lymph nodes. No significant vascular abnormality seen. Reproductive: The uterus is anteverted. An intrauterine device is noted. Evaluation for positioning of the IUD is very limited on this CT. However, the IUD may be  slightly inferiorly displaced. Further evaluation with pelvic ultrasound on a nonemergent/outpatient basis recommended. The ovaries are unremarkable. Other: There is inflammatory changes and induration of the superficial soft tissues of the left perianal region. There is a 3.1 x 2.7 cm ill-defined low-attenuation in the left perianal soft tissues which may represent a phlegmon or developing abscess. Ultrasound may provide better evaluation. Musculoskeletal: No suspicious bone lesions identified. IMPRESSION: Left perianal phlegmon or developing abscess. Electronically Signed   By: Elgie Collard M.D.   On: 04/28/2021 00:07    Anti-infectives: Anti-infectives (From admission, onward)    Start     Dose/Rate Route Frequency Ordered Stop   04/28/21 0600  piperacillin-tazobactam (ZOSYN) IVPB 3.375 g        3.375 g 12.5 mL/hr over 240 Minutes Intravenous Every 8 hours 04/28/21 0100     04/28/21 0015  piperacillin-tazobactam (ZOSYN) IVPB 3.375 g        3.375 g 100 mL/hr over 30 Minutes Intravenous  Once 04/28/21 0013 04/28/21 0247       Assessment/Plan: Left perianal abscess -  Recommend incision and drainage under general anesthesia today by Dr. [name] - on-call surgeon The surgical procedure has been discussed with the patient.  Potential risks, benefits, alternative treatments, and expected outcomes have been explained.  All of the patient's questions at this time have been answered.  The likelihood of reaching the patient's treatment goal is good.  The patient understand the proposed surgical procedure and wishes to proceed.  Patient is NPO but has no IV fluids running - will start maintenance IV fluids until after surgery. Continue antibiotics  for now.   LOS: 0 days    Chelsea Hoover 04/29/2021

## 2021-04-30 ENCOUNTER — Encounter (HOSPITAL_COMMUNITY): Payer: Self-pay | Admitting: General Surgery

## 2021-04-30 LAB — CBC
HCT: 36.4 % (ref 36.0–46.0)
Hemoglobin: 12.2 g/dL (ref 12.0–15.0)
MCH: 29 pg (ref 26.0–34.0)
MCHC: 33.5 g/dL (ref 30.0–36.0)
MCV: 86.5 fL (ref 80.0–100.0)
Platelets: 383 10*3/uL (ref 150–400)
RBC: 4.21 MIL/uL (ref 3.87–5.11)
RDW: 11.9 % (ref 11.5–15.5)
WBC: 20.5 10*3/uL — ABNORMAL HIGH (ref 4.0–10.5)
nRBC: 0 % (ref 0.0–0.2)

## 2021-04-30 LAB — BASIC METABOLIC PANEL
Anion gap: 12 (ref 5–15)
BUN: 5 mg/dL — ABNORMAL LOW (ref 6–20)
CO2: 24 mmol/L (ref 22–32)
Calcium: 9.3 mg/dL (ref 8.9–10.3)
Chloride: 101 mmol/L (ref 98–111)
Creatinine, Ser: 0.86 mg/dL (ref 0.44–1.00)
GFR, Estimated: >60 mL/min (ref >60)
Glucose, Bld: 134 mg/dL — ABNORMAL HIGH (ref 70–99)
Potassium: 3.9 mmol/L (ref 3.5–5.1)
Sodium: 137 mmol/L (ref 135–145)

## 2021-04-30 MED ORDER — AMOXICILLIN-POT CLAVULANATE 875-125 MG PO TABS: 1.0000 | ORAL_TABLET | Freq: Two times a day (BID) | ORAL | 0 refills | Status: DC

## 2021-04-30 MED ORDER — HYDROCODONE-ACETAMINOPHEN 5-325 MG PO TABS: 1.0000 | ORAL_TABLET | Freq: Four times a day (QID) | ORAL | 0 refills | Status: DC | PRN

## 2021-04-30 MED ORDER — ENOXAPARIN SODIUM 40 MG/0.4ML IJ SOSY
40.0000 mg | PREFILLED_SYRINGE | Freq: Every evening | INTRAMUSCULAR | Status: DC
  Administered 2021-04-30: 40 mg via SUBCUTANEOUS
  Filled 2021-04-30: qty 0.4

## 2021-04-30 MED ORDER — POLYETHYLENE GLYCOL 3350 17 G PO PACK
17.0000 g | PACK | Freq: Every day | ORAL | Status: DC
  Administered 2021-04-30 – 2021-05-01 (×2): 17 g via ORAL
  Filled 2021-04-30 (×2): qty 1

## 2021-04-30 NOTE — Progress Notes (Signed)
PROGRESS NOTE    Chelsea Hoover  NWG:[id number] DOB: 1996/03/13 DOA: 04/27/2021 PCP: Patient, No Pcp Per (Inactive)   Brief Narrative: 25 year old with past medical history significant for bipolar disorder, PTSD, irritable bowel syndrome, history of Lyme disease who presents with 4 days of worsening tenderness and pressure in the left side of her rectum.   Patient was found to be febrile temperature 101, leukocytosis, 16, CT scanning of the pelvis showed left perianal phlegmon  or developing abscess.   Assessment & Plan:   Principal Problem:   Perianal cellulitis Active Problems:   Anxiety disorder of adolescence   PTSD (post-traumatic stress disorder)   Bipolar 1 disorder (HCC)   Depression   Perirectal abscess  1-Perianal phlegmon/developing abscess: Peri-rectal abscess CT scanning showed left perianal phlegmon or developing abscess. White blood cell increased today to 17.  Patient reported worsening pain, I have consulted surgery. Continue with IV Zosyn Blood Cultures: No growth to date.  IV morphine for pain PRN.  Vicodin as needed Continue with IV fluids.  Culture from abscess: Growing few E. coli.  Follow sensitivity Patient underwent IND by general surgery on 8/28. Plan to continue with IV antibiotics for another day, white blood cell increased to 20 K  2-Hypokalemia: Resolved.   Bipolar disorder, PTSD, anxiety, depression: Continue with Lamictal, trazodone resume Wellbutrin.   Estimated body mass index is 37.99 kg/m as calculated from the following:   Height as of this encounter: 5\' 1"  (1.549 m).   Weight as of this encounter: 91.2 kg.   DVT prophylaxis: SCDs Code Status: Full Code Family Communication: Care  discussed with patient.,  Mother who was at bedside on 8/28  Disposition Plan:  Status is: Observation  The patient remains OBS appropriate and will d/c before 2 midnights.  Dispo: The patient is from: Home              Anticipated d/c is to:  Home              Patient currently is not medically stable to d/c.   Difficult to place patient No        Consultants:  Surgery  Procedures:  None  Antimicrobials:  IV Zosyn.   Subjective: Patient is alert, eating breakfast, she agreed to stay 1 more day, she has not had a bowel movement.   Objective: Vitals:   04/29/21 1400 04/29/21 1430 04/29/21 2054 04/30/21 0815  BP: 111/71 111/72 106/69 107/65  Pulse: 96 87 87 (!) 55  Resp: (!) 22 17 16 17   Temp: 97.7 F (36.5 C) 99.5 F (37.5 C) 98.9 F (37.2 C) 97.7 F (36.5 C)  TempSrc:  Oral  Oral  SpO2: 92% 93% 96% 96%  Weight:      Height:        Intake/Output Summary (Last 24 hours) at 04/30/2021 1038 Last data filed at 04/29/2021 1520 Gross per 24 hour  Intake 882.33 ml  Output 420 ml  Net 462.33 ml    Filed Weights   04/28/21 0321 04/29/21 1143  Weight: 91.2 kg 91.2 kg    Examination:  General exam:NAD Respiratory system: CTA Cardiovascular system: S 1, S 2 RRR Gastrointestinal system: BS present, Soft, NT Central nervous system: Alert Extremities: No edema   Data Reviewed: I have personally reviewed following labs and imaging studies  CBC: Recent Labs  Lab 04/27/21 2011 04/28/21 0300 04/29/21 0208 04/30/21 0321  WBC 16.8* 17.3* 18.9* 20.5*  NEUTROABS 13.5*  --   --   --  HGB 13.6 12.0 12.4 12.2  HCT 39.8 36.0 36.8 36.4  MCV 87.7 88.5 88.0 86.5  PLT 337 313 351 383    Basic Metabolic Panel: Recent Labs  Lab 04/27/21 2011 04/28/21 0300 04/29/21 0208 04/30/21 0321  NA 136 136 136 137  K 3.4* 3.4* 4.3 3.9  CL 103 107 106 101  CO2 23 21* 21* 24  GLUCOSE 90 121* 96 134*  BUN 7 5* 5* 5*  CREATININE 0.99 0.91 0.86 0.86  CALCIUM 9.1 8.3* 8.5* 9.3  MG  --  1.8  --   --     GFR: Estimated Creatinine Clearance: 104.7 mL/min (by C-G formula based on SCr of 0.86 mg/dL). Liver Function Tests: Recent Labs  Lab 04/27/21 2011  AST 16  ALT 18  ALKPHOS 63  BILITOT 0.9  PROT 7.1   ALBUMIN 4.0    No results for input(s): LIPASE, AMYLASE in the last 168 hours. No results for input(s): AMMONIA in the last 168 hours. Coagulation Profile: No results for input(s): INR, PROTIME in the last 168 hours. Cardiac Enzymes: No results for input(s): CKTOTAL, CKMB, CKMBINDEX, TROPONINI in the last 168 hours. BNP (last 3 results) No results for input(s): PROBNP in the last 8760 hours. HbA1C: No results for input(s): HGBA1C in the last 72 hours. CBG: No results for input(s): GLUCAP in the last 168 hours. Lipid Profile: No results for input(s): CHOL, HDL, LDLCALC, TRIG, CHOLHDL, LDLDIRECT in the last 72 hours. Thyroid Function Tests: No results for input(s): TSH, T4TOTAL, FREET4, T3FREE, THYROIDAB in the last 72 hours. Anemia Panel: No results for input(s): VITAMINB12, FOLATE, FERRITIN, TIBC, IRON, RETICCTPCT in the last 72 hours. Sepsis Labs: Recent Labs  Lab 04/27/21 2011 04/27/21 2244  LATICACIDVEN 0.8 0.7     Recent Results (from the past 240 hour(s))  Culture, blood (routine x 2)     Status: None (Preliminary result)   Collection Time: 04/27/21  8:11 PM   Specimen: BLOOD  Result Value Ref Range Status   Specimen Description BLOOD RIGHT ARM  Final   Special Requests   Final    BOTTLES DRAWN AEROBIC AND ANAEROBIC Blood Culture results may not be optimal due to an inadequate volume of blood received in culture bottles   Culture   Final    NO GROWTH 3 DAYS Performed at [facility], [address] [place], [place] [address]    Report Status PENDING  Incomplete  Culture, blood (routine x 2)     Status: None (Preliminary result)   Collection Time: 04/27/21  8:17 PM   Specimen: BLOOD  Result Value Ref Range Status   Specimen Description BLOOD RIGHT HAND  Final   Special Requests   Final    BOTTLES DRAWN AEROBIC ONLY Blood Culture results may not be optimal due to an inadequate volume of blood received in culture bottles   Culture   Final    NO GROWTH 3  DAYS Performed at [facility], [address] [place], [place] [address]    Report Status PENDING  Incomplete  Resp Panel by RT-PCR (Flu A&B, Covid) Nasopharyngeal Swab     Status: None   Collection Time: 04/27/21 10:39 PM   Specimen: Nasopharyngeal Swab; Nasopharyngeal(NP) swabs in vial transport medium  Result Value Ref Range Status   SARS Coronavirus 2 by RT PCR NEGATIVE NEGATIVE Final    Comment: (NOTE) SARS-CoV-2 target nucleic acids are NOT DETECTED.  The SARS-CoV-2 RNA is generally detectable in upper respiratory  specimens during the acute phase of infection. The lowest concentration of SARS-CoV-2 viral copies this assay can detect is 138 copies/mL. A negative result does not preclude SARS-Cov-2 infection and should not be used as the sole basis for treatment or other patient management decisions. A negative result may occur with  improper specimen collection/handling, submission of specimen other than nasopharyngeal swab, presence of viral mutation(s) within the areas targeted by this assay, and inadequate number of viral copies(<138 copies/mL). A negative result must be combined with clinical observations, patient history, and epidemiological information. The expected result is Negative.  Fact Sheet for Patients:  BloggerCourse.com  Fact Sheet for Healthcare Providers:  SeriousBroker.it  This test is no t yet approved or cleared by the [place] FDA and  has been authorized for detection and/or diagnosis of SARS-CoV-2 by FDA under an Emergency Use Authorization (EUA). This EUA will remain  in effect (meaning this test can be used) for the duration of the COVID-19 declaration under Section 564(b)(1) of the Act, 21 U.S.C.section 360bbb-3(b)(1), unless the authorization is terminated  or revoked sooner.       Influenza A by PCR NEGATIVE NEGATIVE Final   Influenza B by PCR NEGATIVE NEGATIVE Final    Comment:  (NOTE) The Xpert Xpress SARS-CoV-2/FLU/RSV plus assay is intended as an aid in the diagnosis of influenza from Nasopharyngeal swab specimens and should not be used as a sole basis for treatment. Nasal washings and aspirates are unacceptable for Xpert Xpress SARS-CoV-2/FLU/RSV testing.  Fact Sheet for Patients: BloggerCourse.com  Fact Sheet for Healthcare Providers: SeriousBroker.it  This test is not yet approved or cleared by the [place] FDA and has been authorized for detection and/or diagnosis of SARS-CoV-2 by FDA under an Emergency Use Authorization (EUA). This EUA will remain in effect (meaning this test can be used) for the duration of the COVID-19 declaration under Section 564(b)(1) of the Act, 21 U.S.C. section 360bbb-3(b)(1), unless the authorization is terminated or revoked.  Performed at [facility], [address], [place], [place] [address]   Surgical pcr screen     Status: None   Collection Time: 04/28/21  5:55 PM   Specimen: Nasal Mucosa; Nasal Swab  Result Value Ref Range Status   MRSA, PCR NEGATIVE NEGATIVE Final   Staphylococcus aureus NEGATIVE NEGATIVE Final    Comment: (NOTE) The Xpert SA Assay (FDA approved for NASAL specimens in patients 90 years of age and older), is one component of a comprehensive surveillance program. It is not intended to diagnose infection nor to guide or monitor treatment. Performed at [facility], [address] [place], [place] [address]   Aerobic/Anaerobic Culture w Gram Stain (surgical/deep wound)     Status: None (Preliminary result)   Collection Time: 04/29/21  1:01 PM   Specimen: Abscess  Result Value Ref Range Status   Specimen Description ABSCESS  Final   Special Requests PERIANAL ABSC SPEC A  Final   Gram Stain   Final    FEW WBC PRESENT,BOTH PMN AND MONONUCLEAR RARE GRAM POSITIVE COCCI RARE GRAM NEGATIVE RODS Performed at [facility]  Lab, [address], [place], [place] [address]    Culture PENDING  Incomplete   Report Status PENDING  Incomplete          Radiology Studies: No results found.      Scheduled Meds:  buPROPion  100 mg Oral q morning   famotidine  20 mg Oral BID   lamoTRIgine  100 mg  Oral BID   sodium chloride flush  3 mL Intravenous Q12H   traZODone  75 mg Oral QHS   Continuous Infusions:  lactated ringers     lactated ringers 100 mL/hr at 04/29/21 1555   piperacillin-tazobactam (ZOSYN)  IV 3.375 g (04/30/21 0557)     LOS: 1 day    Time spent: 35 minutes.     Alba CoryBelkys A Allona Gondek, MD [facility]   If 7PM-7AM, please contact night-coverage [email or web address]  04/30/2021, 10:38 AM

## 2021-04-30 NOTE — Progress Notes (Signed)
1 Day Post-Op  Subjective: CC: Doing well. Having some pain near incision controlled with PO pain medication. Tolerating diet without n/v. Mobilizing in room. Voiding. She is a full time Consulting civil engineer at [employer]. Lives at home with her parent and sisters.   Objective: Vital signs in last 24 hours: Temp:  [97.7 F (36.5 C)-99.5 F (37.5 C)] 97.7 F (36.5 C) (08/29 0815) Pulse Rate:  [55-104] 55 (08/29 0815) Resp:  [16-23] 17 (08/29 0815) BP: (86-115)/(52-81) 107/65 (08/29 0815) SpO2:  [92 %-98 %] 96 % (08/29 0815) Weight:  [91.2 kg] 91.2 kg (08/28 1143) Last BM Date: 04/30/21  Intake/Output from previous day: 08/28 0701 - 08/29 0700 In: 882.3 [I.V.:832.3; IV Piggyback:50] Out: 420 [Urine:400; Blood:20] Intake/Output this shift: No intake/output data recorded.  PE: Gen:  Alert, NAD, pleasant Pulm: normal rate and effort  GU: RN present as chaperone. L perianal incision with packing in place that was removed. Bloody drainage on dressing/packing. Wound hemostatic. No drainage. No surrounding erythema, heat, induration or signs of cellulitis. No additional areas of fluctuance.  Psych: A&Ox3  Skin: no rashes noted, warm and dry  Lab Results:  Recent Labs    04/29/21 0208 04/30/21 0321  WBC 18.9* 20.5*  HGB 12.4 12.2  HCT 36.8 36.4  PLT 351 383   BMET Recent Labs    04/29/21 0208 04/30/21 0321  NA 136 137  K 4.3 3.9  CL 106 101  CO2 21* 24  GLUCOSE 96 134*  BUN 5* 5*  CREATININE 0.86 0.86  CALCIUM 8.5* 9.3   PT/INR No results for input(s): LABPROT, INR in the last 72 hours. CMP     Component Value Date/Time   NA 137 04/30/2021 0321   K 3.9 04/30/2021 0321   CL 101 04/30/2021 0321   CO2 24 04/30/2021 0321   GLUCOSE 134 (H) 04/30/2021 0321   BUN 5 (L) 04/30/2021 0321   CREATININE 0.86 04/30/2021 0321   CALCIUM 9.3 04/30/2021 0321   PROT 7.1 04/27/2021 2011   ALBUMIN 4.0 04/27/2021 2011   AST 16 04/27/2021 2011   ALT 18 04/27/2021 2011   ALKPHOS 63  04/27/2021 2011   BILITOT 0.9 04/27/2021 2011   GFRNONAA >60 04/30/2021 0321   GFRAA >60 11/27/2019 0703   Lipase     Component Value Date/Time   LIPASE 30 07/07/2016 1340    Studies/Results: No results found.  Anti-infectives: Anti-infectives (From admission, onward)    Start     Dose/Rate Route Frequency Ordered Stop   04/28/21 0600  piperacillin-tazobactam (ZOSYN) IVPB 3.375 g        3.375 g 12.5 mL/hr over 240 Minutes Intravenous Every 8 hours 04/28/21 0100     04/28/21 0015  piperacillin-tazobactam (ZOSYN) IVPB 3.375 g        3.375 g 100 mL/hr over 30 Minutes Intravenous  Once 04/28/21 0013 04/28/21 0247        Assessment/Plan POD 1 s/p I&D of perianal abscess (L) - 8/29 by Dr. [name] - Packing out, does not need to be replaced - No additional surgeries indicated at this time - Start sitz baths, TID and after every BM - Can shower, let soapy water run over incision - Cont abx, can switch to PO - Okay for d/c from our standpoint. Will write for school note. Will send for follow up. Will send rx for pain medication and abx to her pharmacy. Cx's pending. Will reach out to [facility] to let them know.   FEN - Reg  VTE - Okay for chemical prophylaxis from a general surgery standpoint ID - Zosyn    LOS: 1 day    Jacinto Halim , [facility] 04/30/2021, 9:12 AM Please see Amion for pager number during day hours 7:00am-4:30pm

## 2021-05-01 LAB — CBC
HCT: 33.3 % — ABNORMAL LOW (ref 36.0–46.0)
Hemoglobin: 11.4 g/dL — ABNORMAL LOW (ref 12.0–15.0)
MCH: 29.7 pg (ref 26.0–34.0)
MCHC: 34.2 g/dL (ref 30.0–36.0)
MCV: 86.7 fL (ref 80.0–100.0)
Platelets: 371 10*3/uL (ref 150–400)
RBC: 3.84 MIL/uL — ABNORMAL LOW (ref 3.87–5.11)
RDW: 12 % (ref 11.5–15.5)
WBC: 13.4 10*3/uL — ABNORMAL HIGH (ref 4.0–10.5)
nRBC: 0 % (ref 0.0–0.2)

## 2021-05-01 MED ORDER — POLYETHYLENE GLYCOL 3350 17 G PO PACK: 17.0000 g | PACK | Freq: Every day | ORAL | 0 refills | Status: DC

## 2021-05-01 NOTE — Plan of Care (Signed)
  Problem: Clinical Measurements: Goal: Will remain free from infection Outcome: Progressing   Problem: Activity: Goal: Risk for activity intolerance will decrease Outcome: Progressing   Problem: Pain Managment: Goal: General experience of comfort will improve Outcome: Progressing   Problem: Safety: Goal: Ability to remain free from injury will improve Outcome: Progressing   

## 2021-05-01 NOTE — Discharge Summary (Signed)
Physician Discharge Summary  Chelsea Hoover ZOX:[id number] DOB: July 01, 1996 DOA: 04/27/2021  PCP: Patient, No Pcp Per (Inactive)  Admit date: 04/27/2021 Discharge date: 05/01/2021  Admitted From: Home  Disposition:  Home   Recommendations for Outpatient Follow-up:  Follow up with PCP in 1-2 weeks Please obtain BMP/CBC in one week Follow up with Surgery on 9/20 at 8;45 am.   Home Health: None  Discharge Condition: Stable.  CODE STATUS: Full code Diet recommendation: Heart Healthy  Brief/Interim Summary: 25 year old with past medical history significant for bipolar disorder, PTSD, irritable bowel syndrome, history of Lyme disease who presents with 4 days of worsening tenderness and pressure in the left side of her rectum.    Patient was found to be febrile temperature 101, leukocytosis, 16, CT scanning of the pelvis showed left perianal phlegmon  or developing abscess. He was evaluated by surgical team and underwent I&D by general surgery on 8/28.  Culture from wound grew E. coli sensitive to Augmentin.  She is stable,  white blood cell count down to 13 plan to discharge home today.   1-Perianal phlegmon/developing abscess: Peri-rectal abscess CT scanning showed left perianal phlegmon or developing abscess. White blood cell increased today to 17.  Patient reported worsening pain, surgery consulted. Treated  with IV Zosyn while in the hospital. She will be discharge on Augmentin.  Blood Cultures: No growth to date.  IV morphine for pain PRN.  Vicodin as needed Received IV fluids.  Culture from abscess: Growing few E. coli.  sensitive to augmentin.  Patient underwent IND by general surgery on 8/28. Patient remain on IV antibiotics for another day, because white blood cell increased to 20 K post sx.  WBC down to 13, stable for discharge today.    2-Hypokalemia: Resolved.     Bipolar disorder, PTSD, anxiety, depression: Continue with Lamictal, trazodone resume Wellbutrin.     Discharge Diagnoses:  Principal Problem:   Perianal cellulitis Active Problems:   Anxiety disorder of adolescence   PTSD (post-traumatic stress disorder)   Bipolar 1 disorder (HCC)   Depression   Perirectal abscess    Discharge Instructions  Discharge Instructions     Diet - low sodium heart healthy   Complete by: As directed    Discharge wound care:   Complete by: As directed    See AVS   Increase activity slowly   Complete by: As directed       Allergies as of 05/01/2021       Reactions   Oxycodone Other (See Comments)   Intense vomiting   Lactose Intolerance (gi) Other (See Comments)   Pain in stomach   Latex Rash        Medication List     STOP taking these medications    DULoxetine 20 MG capsule Commonly known as: CYMBALTA   PREPARATION H EX       TAKE these medications    amoxicillin-clavulanate 875-125 MG tablet Commonly known as: AUGMENTIN Take 1 tablet by mouth every 12 (twelve) hours.   buPROPion 100 MG tablet Commonly known as: WELLBUTRIN Take 100 mg by mouth every morning.   cetirizine 10 MG tablet Commonly known as: ZYRTEC Take 10 mg by mouth daily as needed for allergies.   famotidine 20 MG tablet Commonly known as: PEPCID Take 1 tablet (20 mg total) by mouth 2 (two) times daily.   HYDROcodone-acetaminophen 5-325 MG tablet Commonly known as: NORCO/VICODIN Take 1 tablet by mouth every 6 (six) hours as needed for severe pain.  hydrOXYzine 25 MG tablet Commonly known as: ATARAX/VISTARIL Take 25 mg by mouth daily as needed for anxiety.   lamoTRIgine 100 MG tablet Commonly known as: LAMICTAL Take 1 tablet (100 mg total) by mouth 2 (two) times daily.   polyethylene glycol 17 g packet Commonly known as: MIRALAX / GLYCOLAX Take 17 g by mouth daily. Start taking on: May 02, 2021   traZODone 150 MG tablet Commonly known as: DESYREL Take 0.5 tablets (75 mg total) by mouth at bedtime.               Discharge  Care Instructions  (From admission, onward)           Start     Ordered   05/01/21 0000  Discharge wound care:       Comments: See AVS   05/01/21 1023            Follow-up Information     Surgery, [facility]. Call on 05/22/2021.   Specialty: General Surgery Why: Appt on 05/22/21 at 8:45 am. Please bring a copy of your photo ID and insurance card. Please arrive 30-45 minutes prior to your appointment for paperwork. Contact information: [address] [place] [address] [phone]                Allergies  Allergen Reactions   Oxycodone Other (See Comments)    Intense vomiting   Lactose Intolerance (Gi) Other (See Comments)    Pain in stomach   Latex Rash    Consultations: General sx   Procedures/Studies: CT PELVIS W CONTRAST  Result Date: 04/28/2021 CLINICAL DATA:  Concern for perianal abscess. EXAM: CT PELVIS WITH CONTRAST TECHNIQUE: Multidetector CT imaging of the pelvis was performed using the standard protocol following the bolus administration of intravenous contrast. CONTRAST:  39mL OMNIPAQUE IOHEXOL 350 MG/ML SOLN COMPARISON:  None. FINDINGS: Urinary Tract:  No abnormality visualized. Bowel:  Unremarkable visualized pelvic bowel loops. Vascular/Lymphatic: No pathologically enlarged lymph nodes. No significant vascular abnormality seen. Reproductive: The uterus is anteverted. An intrauterine device is noted. Evaluation for positioning of the IUD is very limited on this CT. However, the IUD may be slightly inferiorly displaced. Further evaluation with pelvic ultrasound on a nonemergent/outpatient basis recommended. The ovaries are unremarkable. Other: There is inflammatory changes and induration of the superficial soft tissues of the left perianal region. There is a 3.1 x 2.7 cm ill-defined low-attenuation in the left perianal soft tissues which may represent a phlegmon or developing abscess. Ultrasound may provide better evaluation.  Musculoskeletal: No suspicious bone lesions identified. IMPRESSION: Left perianal phlegmon or developing abscess. Electronically Signed   By: Elgie Collard M.D.   On: 04/28/2021 00:07     Subjective: She is feeling well. She was able to have BM  Discharge Exam: Vitals:   05/01/21 0743 05/01/21 1000  BP: (!) 90/58 102/61  Pulse: 73 61  Resp: 15 17  Temp: 97.9 F (36.6 C) (!) 97.4 F (36.3 C)  SpO2: 97% 97%     General: Pt is alert, awake, not in acute distress Cardiovascular: RRR, S1/S2 +, no rubs, no gallops Respiratory: CTA bilaterally, no wheezing, no rhonchi Abdominal: Soft, NT, ND, bowel sounds + Extremities: no edema, no cyanosis    The results of significant diagnostics from this hospitalization (including imaging, microbiology, ancillary and laboratory) are listed below for reference.     Microbiology: Recent Results (from the past 240 hour(s))  Culture, blood (routine x 2)     Status:  None (Preliminary result)   Collection Time: 04/27/21  8:11 PM   Specimen: BLOOD  Result Value Ref Range Status   Specimen Description BLOOD RIGHT ARM  Final   Special Requests   Final    BOTTLES DRAWN AEROBIC AND ANAEROBIC Blood Culture results may not be optimal due to an inadequate volume of blood received in culture bottles   Culture   Final    NO GROWTH 4 DAYS Performed at [facility], [address], [place], [place] [address]    Report Status PENDING  Incomplete  Culture, blood (routine x 2)     Status: None (Preliminary result)   Collection Time: 04/27/21  8:17 PM   Specimen: BLOOD  Result Value Ref Range Status   Specimen Description BLOOD RIGHT HAND  Final   Special Requests   Final    BOTTLES DRAWN AEROBIC ONLY Blood Culture results may not be optimal due to an inadequate volume of blood received in culture bottles   Culture   Final    NO GROWTH 4 DAYS Performed at [facility], [address], [place], [place] [address]    Report Status PENDING   Incomplete  Resp Panel by RT-PCR (Flu A&B, Covid) Nasopharyngeal Swab     Status: None   Collection Time: 04/27/21 10:39 PM   Specimen: Nasopharyngeal Swab; Nasopharyngeal(NP) swabs in vial transport medium  Result Value Ref Range Status   SARS Coronavirus 2 by RT PCR NEGATIVE NEGATIVE Final    Comment: (NOTE) SARS-CoV-2 target nucleic acids are NOT DETECTED.  The SARS-CoV-2 RNA is generally detectable in upper respiratory specimens during the acute phase of infection. The lowest concentration of SARS-CoV-2 viral copies this assay can detect is 138 copies/mL. A negative result does not preclude SARS-Cov-2 infection and should not be used as the sole basis for treatment or other patient management decisions. A negative result may occur with  improper specimen collection/handling, submission of specimen other than nasopharyngeal swab, presence of viral mutation(s) within the areas targeted by this assay, and inadequate number of viral copies(<138 copies/mL). A negative result must be combined with clinical observations, patient history, and epidemiological information. The expected result is Negative.  Fact Sheet for Patients:  BloggerCourse.com  Fact Sheet for Healthcare Providers:  SeriousBroker.it  This test is no t yet approved or cleared by the [place] FDA and  has been authorized for detection and/or diagnosis of SARS-CoV-2 by FDA under an Emergency Use Authorization (EUA). This EUA will remain  in effect (meaning this test can be used) for the duration of the COVID-19 declaration under Section 564(b)(1) of the Act, 21 U.S.C.section 360bbb-3(b)(1), unless the authorization is terminated  or revoked sooner.       Influenza A by PCR NEGATIVE NEGATIVE Final   Influenza B by PCR NEGATIVE NEGATIVE Final    Comment: (NOTE) The Xpert Xpress SARS-CoV-2/FLU/RSV plus assay is intended as an aid in the diagnosis of influenza  from Nasopharyngeal swab specimens and should not be used as a sole basis for treatment. Nasal washings and aspirates are unacceptable for Xpert Xpress SARS-CoV-2/FLU/RSV testing.  Fact Sheet for Patients: BloggerCourse.com  Fact Sheet for Healthcare Providers: SeriousBroker.it  This test is not yet approved or cleared by the [place] FDA and has been authorized for detection and/or diagnosis of SARS-CoV-2 by FDA under an Emergency Use Authorization (EUA). This EUA will remain in effect (meaning this test can be used) for the duration of the COVID-19 declaration under Section  564(b)(1) of the Act, 21 U.S.C. section 360bbb-3(b)(1), unless the authorization is terminated or revoked.  Performed at [facility], [address], [place], [place] [address]   Surgical pcr screen     Status: None   Collection Time: 04/28/21  5:55 PM   Specimen: Nasal Mucosa; Nasal Swab  Result Value Ref Range Status   MRSA, PCR NEGATIVE NEGATIVE Final   Staphylococcus aureus NEGATIVE NEGATIVE Final    Comment: (NOTE) The Xpert SA Assay (FDA approved for NASAL specimens in patients 94 years of age and older), is one component of a comprehensive surveillance program. It is not intended to diagnose infection nor to guide or monitor treatment. Performed at [facility], [address], [place], [place] [address]   Aerobic/Anaerobic Culture w Gram Stain (surgical/deep wound)     Status: None (Preliminary result)   Collection Time: 04/29/21  1:01 PM   Specimen: Abscess  Result Value Ref Range Status   Specimen Description ABSCESS  Final   Special Requests PERIANAL ABSC SPEC A  Final   Gram Stain   Final    FEW WBC PRESENT,BOTH PMN AND MONONUCLEAR RARE GRAM POSITIVE COCCI RARE GRAM NEGATIVE RODS Performed at [facility], [address] [place], [place] [address]    Culture FEW ESCHERICHIA COLI  Final   Report Status PENDING   Incomplete   Organism ID, Bacteria ESCHERICHIA COLI  Final      Susceptibility   Escherichia coli - MIC*    AMPICILLIN <=2 SENSITIVE Sensitive     CEFAZOLIN <=4 SENSITIVE Sensitive     CEFEPIME <=0.12 SENSITIVE Sensitive     CEFTAZIDIME <=1 SENSITIVE Sensitive     CEFTRIAXONE <=0.25 SENSITIVE Sensitive     CIPROFLOXACIN <=0.25 SENSITIVE Sensitive     GENTAMICIN <=1 SENSITIVE Sensitive     IMIPENEM <=0.25 SENSITIVE Sensitive     TRIMETH/SULFA <=20 SENSITIVE Sensitive     AMPICILLIN/SULBACTAM <=2 SENSITIVE Sensitive     PIP/TAZO <=4 SENSITIVE Sensitive     * FEW ESCHERICHIA COLI     Labs: BNP (last 3 results) No results for input(s): BNP in the last 8760 hours. Basic Metabolic Panel: Recent Labs  Lab 04/27/21 2011 04/28/21 0300 04/29/21 0208 04/30/21 0321  NA 136 136 136 137  K 3.4* 3.4* 4.3 3.9  CL 103 107 106 101  CO2 23 21* 21* 24  GLUCOSE 90 121* 96 134*  BUN 7 5* 5* 5*  CREATININE 0.99 0.91 0.86 0.86  CALCIUM 9.1 8.3* 8.5* 9.3  MG  --  1.8  --   --    Liver Function Tests: Recent Labs  Lab 04/27/21 2011  AST 16  ALT 18  ALKPHOS 63  BILITOT 0.9  PROT 7.1  ALBUMIN 4.0   No results for input(s): LIPASE, AMYLASE in the last 168 hours. No results for input(s): AMMONIA in the last 168 hours. CBC: Recent Labs  Lab 04/27/21 2011 04/28/21 0300 04/29/21 0208 04/30/21 0321 05/01/21 0259  WBC 16.8* 17.3* 18.9* 20.5* 13.4*  NEUTROABS 13.5*  --   --   --   --   HGB 13.6 12.0 12.4 12.2 11.4*  HCT 39.8 36.0 36.8 36.4 33.3*  MCV 87.7 88.5 88.0 86.5 86.7  PLT 337 313 351 383 371   Cardiac Enzymes: No results for input(s): CKTOTAL, CKMB, CKMBINDEX, TROPONINI in the last 168 hours. BNP: Invalid input(s): POCBNP CBG: No results for input(s): GLUCAP in the last 168 hours. D-Dimer No results for  input(s): DDIMER in the last 72 hours. Hgb A1c No results for input(s): HGBA1C in the last 72 hours. Lipid Profile No results for input(s): CHOL, HDL, LDLCALC, TRIG,  CHOLHDL, LDLDIRECT in the last 72 hours. Thyroid function studies No results for input(s): TSH, T4TOTAL, T3FREE, THYROIDAB in the last 72 hours.  Invalid input(s): FREET3 Anemia work up No results for input(s): VITAMINB12, FOLATE, FERRITIN, TIBC, IRON, RETICCTPCT in the last 72 hours. Urinalysis    Component Value Date/Time   COLORURINE YELLOW 04/28/2021 0016   APPEARANCEUR HAZY (A) 04/28/2021 0016   LABSPEC 1.017 04/28/2021 0016   PHURINE 5.0 04/28/2021 0016   GLUCOSEU NEGATIVE 04/28/2021 0016   HGBUR LARGE (A) 04/28/2021 0016   BILIRUBINUR NEGATIVE 04/28/2021 0016   KETONESUR 20 (A) 04/28/2021 0016   PROTEINUR NEGATIVE 04/28/2021 0016   NITRITE NEGATIVE 04/28/2021 0016   LEUKOCYTESUR MODERATE (A) 04/28/2021 0016   Sepsis Labs Invalid input(s): PROCALCITONIN,  WBC,  LACTICIDVEN Microbiology Recent Results (from the past 240 hour(s))  Culture, blood (routine x 2)     Status: None (Preliminary result)   Collection Time: 04/27/21  8:11 PM   Specimen: BLOOD  Result Value Ref Range Status   Specimen Description BLOOD RIGHT ARM  Final   Special Requests   Final    BOTTLES DRAWN AEROBIC AND ANAEROBIC Blood Culture results may not be optimal due to an inadequate volume of blood received in culture bottles   Culture   Final    NO GROWTH 4 DAYS Performed at [facility], [address] [place], [place] [address]    Report Status PENDING  Incomplete  Culture, blood (routine x 2)     Status: None (Preliminary result)   Collection Time: 04/27/21  8:17 PM   Specimen: BLOOD  Result Value Ref Range Status   Specimen Description BLOOD RIGHT HAND  Final   Special Requests   Final    BOTTLES DRAWN AEROBIC ONLY Blood Culture results may not be optimal due to an inadequate volume of blood received in culture bottles   Culture   Final    NO GROWTH 4 DAYS Performed at [facility], [address], [place], [place] [address]    Report Status PENDING  Incomplete  Resp Panel by  RT-PCR (Flu A&B, Covid) Nasopharyngeal Swab     Status: None   Collection Time: 04/27/21 10:39 PM   Specimen: Nasopharyngeal Swab; Nasopharyngeal(NP) swabs in vial transport medium  Result Value Ref Range Status   SARS Coronavirus 2 by RT PCR NEGATIVE NEGATIVE Final    Comment: (NOTE) SARS-CoV-2 target nucleic acids are NOT DETECTED.  The SARS-CoV-2 RNA is generally detectable in upper respiratory specimens during the acute phase of infection. The lowest concentration of SARS-CoV-2 viral copies this assay can detect is 138 copies/mL. A negative result does not preclude SARS-Cov-2 infection and should not be used as the sole basis for treatment or other patient management decisions. A negative result may occur with  improper specimen collection/handling, submission of specimen other than nasopharyngeal swab, presence of viral mutation(s) within the areas targeted by this assay, and inadequate number of viral copies(<138 copies/mL). A negative result must be combined with clinical observations, patient history, and epidemiological information. The expected result is Negative.  Fact Sheet for Patients:  BloggerCourse.com  Fact Sheet for Healthcare Providers:  SeriousBroker.it  This test is no t yet approved or cleared by the [place] FDA and  has been authorized for detection and/or diagnosis of SARS-CoV-2  by FDA under an Emergency Use Authorization (EUA). This EUA will remain  in effect (meaning this test can be used) for the duration of the COVID-19 declaration under Section 564(b)(1) of the Act, 21 U.S.C.section 360bbb-3(b)(1), unless the authorization is terminated  or revoked sooner.       Influenza A by PCR NEGATIVE NEGATIVE Final   Influenza B by PCR NEGATIVE NEGATIVE Final    Comment: (NOTE) The Xpert Xpress SARS-CoV-2/FLU/RSV plus assay is intended as an aid in the diagnosis of influenza from Nasopharyngeal swab  specimens and should not be used as a sole basis for treatment. Nasal washings and aspirates are unacceptable for Xpert Xpress SARS-CoV-2/FLU/RSV testing.  Fact Sheet for Patients: BloggerCourse.com  Fact Sheet for Healthcare Providers: SeriousBroker.it  This test is not yet approved or cleared by the [place] FDA and has been authorized for detection and/or diagnosis of SARS-CoV-2 by FDA under an Emergency Use Authorization (EUA). This EUA will remain in effect (meaning this test can be used) for the duration of the COVID-19 declaration under Section 564(b)(1) of the Act, 21 U.S.C. section 360bbb-3(b)(1), unless the authorization is terminated or revoked.  Performed at [facility], [address], [place], [place] [address]   Surgical pcr screen     Status: None   Collection Time: 04/28/21  5:55 PM   Specimen: Nasal Mucosa; Nasal Swab  Result Value Ref Range Status   MRSA, PCR NEGATIVE NEGATIVE Final   Staphylococcus aureus NEGATIVE NEGATIVE Final    Comment: (NOTE) The Xpert SA Assay (FDA approved for NASAL specimens in patients 25 years of age and older), is one component of a comprehensive surveillance program. It is not intended to diagnose infection nor to guide or monitor treatment. Performed at [facility], [address], [place], [place] [address]   Aerobic/Anaerobic Culture w Gram Stain (surgical/deep wound)     Status: None (Preliminary result)   Collection Time: 04/29/21  1:01 PM   Specimen: Abscess  Result Value Ref Range Status   Specimen Description ABSCESS  Final   Special Requests PERIANAL ABSC SPEC A  Final   Gram Stain   Final    FEW WBC PRESENT,BOTH PMN AND MONONUCLEAR RARE GRAM POSITIVE COCCI RARE GRAM NEGATIVE RODS Performed at [facility], [address], [place], [place] [address]    Culture FEW ESCHERICHIA COLI  Final   Report Status PENDING  Incomplete   Organism ID,  Bacteria ESCHERICHIA COLI  Final      Susceptibility   Escherichia coli - MIC*    AMPICILLIN <=2 SENSITIVE Sensitive     CEFAZOLIN <=4 SENSITIVE Sensitive     CEFEPIME <=0.12 SENSITIVE Sensitive     CEFTAZIDIME <=1 SENSITIVE Sensitive     CEFTRIAXONE <=0.25 SENSITIVE Sensitive     CIPROFLOXACIN <=0.25 SENSITIVE Sensitive     GENTAMICIN <=1 SENSITIVE Sensitive     IMIPENEM <=0.25 SENSITIVE Sensitive     TRIMETH/SULFA <=20 SENSITIVE Sensitive     AMPICILLIN/SULBACTAM <=2 SENSITIVE Sensitive     PIP/TAZO <=4 SENSITIVE Sensitive     * FEW ESCHERICHIA COLI     Time coordinating discharge: 40 minutes  SIGNED:   Alba Cory, MD  [facility]

## 2021-05-01 NOTE — Plan of Care (Signed)

## 2021-05-01 NOTE — Progress Notes (Signed)
AVS d/c teaching was provided to pt at bedside. Pt verbalized understanding of teaching and was given her d/c packet for reference. Pt has received all her personal belongings and has been escorted from the unit by staff.

## 2021-05-02 LAB — CULTURE, BLOOD (ROUTINE X 2)
Culture: NO GROWTH
Culture: NO GROWTH

## 2021-05-03 LAB — AEROBIC/ANAEROBIC CULTURE W GRAM STAIN (SURGICAL/DEEP WOUND)

## 2021-07-24 ENCOUNTER — Other Ambulatory Visit (HOSPITAL_COMMUNITY): Payer: Self-pay | Admitting: Gastroenterology

## 2021-07-24 ENCOUNTER — Other Ambulatory Visit: Payer: Self-pay | Admitting: Gastroenterology

## 2021-07-24 DIAGNOSIS — R1011 Right upper quadrant pain: Secondary | ICD-10-CM

## 2021-08-03 ENCOUNTER — Encounter (HOSPITAL_COMMUNITY): Payer: Self-pay

## 2021-08-03 ENCOUNTER — Ambulatory Visit (HOSPITAL_COMMUNITY)
Admission: RE | Admit: 2021-08-03 | Discharge: 2021-08-03 | Disposition: A | Discharge status: Home / Self Care | Payer: 59 | Source: Ambulatory Visit | Attending: Gastroenterology | Admitting: Gastroenterology

## 2021-08-03 ENCOUNTER — Encounter (HOSPITAL_COMMUNITY): Admission: RE | Admit: 2021-08-03 | Payer: Self-pay | Source: Ambulatory Visit

## 2021-08-03 ENCOUNTER — Other Ambulatory Visit: Payer: Self-pay

## 2021-08-03 ENCOUNTER — Encounter (HOSPITAL_COMMUNITY): Payer: 59

## 2021-08-03 ENCOUNTER — Ambulatory Visit (HOSPITAL_COMMUNITY): Payer: Self-pay

## 2021-08-03 DIAGNOSIS — R1011 Right upper quadrant pain: Secondary | ICD-10-CM | POA: Diagnosis not present

## 2021-08-07 ENCOUNTER — Encounter (HOSPITAL_COMMUNITY): Payer: 59

## 2021-08-07 ENCOUNTER — Encounter (HOSPITAL_COMMUNITY): Payer: Self-pay

## 2021-10-01 ENCOUNTER — Ambulatory Visit: Payer: Self-pay | Admitting: Surgery

## 2021-10-01 NOTE — Pre-Procedure Instructions (Addendum)
Surgical Instructions    Your procedure is scheduled on Friday, February 10th.  Report to [facility] Main Entrance "A" at 11:00 A.M., then check in with the Admitting office.  Call this number if you have problems the morning of surgery:  [phone]   If you have any questions prior to your surgery date call [phone]: Open Monday-Friday 8am-4pm    Remember:  Do not eat or drink after midnight the night before your surgery      Take these medicines the morning of surgery with A SIP OF WATER  buPROPion (WELLBUTRIN) fluconazole (DIFLUCAN) lamoTRIgine (LAMICTAL)  pantoprazole (PROTONIX)  tinidazole (TINDAMAX)    If needed: hydrOXYzine (ATARAX/VISTARIL)  As of today, STOP taking any Aspirin (unless otherwise instructed by your surgeon) Aleve, Naproxen, Ibuprofen, Motrin, Advil, Goody's, BC's, all herbal medications, fish oil, and all vitamins.                     Do NOT Smoke (Tobacco/Vaping) for 24 hours prior to your procedure.  If you use a CPAP at night, you may bring your mask/headgear for your overnight stay.   Contacts, glasses, piercing's, hearing aid's, dentures or partials may not be worn into surgery, please bring cases for these belongings.    For patients admitted to the hospital, discharge time will be determined by your treatment team.   Patients discharged the day of surgery will not be allowed to drive home, and someone needs to stay with them for 24 hours.  NO VISITORS WILL BE ALLOWED IN PRE-OP WHERE PATIENTS ARE PREPPED FOR SURGERY.  ONLY 1 SUPPORT PERSON MAY BE PRESENT IN THE WAITING ROOM WHILE YOU ARE IN SURGERY.  IF YOU ARE TO BE ADMITTED, ONCE YOU ARE IN YOUR ROOM YOU WILL BE ALLOWED TWO (2) VISITORS. (1) VISITOR MAY STAY OVERNIGHT BUT MUST ARRIVE TO THE ROOM BY 8pm.  Minor children may have two parents present. Special consideration for safety and communication needs will be reviewed on a case by case basis.   Special instructions:   [place]-  Preparing For Surgery  Before surgery, you can play an important role. Because skin is not sterile, your skin needs to be as free of germs as possible. You can reduce the number of germs on your skin by washing with CHG (chlorahexidine gluconate) Soap before surgery.  CHG is an antiseptic cleaner which kills germs and bonds with the skin to continue killing germs even after washing.    Oral Hygiene is also important to reduce your risk of infection.  Remember - BRUSH YOUR TEETH THE MORNING OF SURGERY WITH YOUR REGULAR TOOTHPASTE  Please do not use if you have an allergy to CHG or antibacterial soaps. If your skin becomes reddened/irritated stop using the CHG.  Do not shave (including legs and underarms) for at least 48 hours prior to first CHG shower. It is OK to shave your face.  Please follow these instructions carefully.   Shower the NIGHT BEFORE SURGERY and the MORNING OF SURGERY  If you chose to wash your hair, wash your hair first as usual with your normal shampoo.  After you shampoo, rinse your hair and body thoroughly to remove the shampoo.  Use CHG Soap as you would any other liquid soap. You can apply CHG directly to the skin and wash gently with a scrungie or a clean washcloth.   Apply the CHG Soap to your body ONLY FROM THE NECK DOWN.  Do not use on open wounds  or open sores. Avoid contact with your eyes, ears, mouth and genitals (private parts). Wash Face and genitals (private parts)  with your normal soap.   Wash thoroughly, paying special attention to the area where your surgery will be performed.  Thoroughly rinse your body with warm water from the neck down.  DO NOT shower/wash with your normal soap after using and rinsing off the CHG Soap.  Pat yourself dry with a CLEAN TOWEL.  Wear CLEAN PAJAMAS to bed the night before surgery  Place CLEAN SHEETS on your bed the night before your surgery  DO NOT SLEEP WITH PETS.   Day of Surgery: Shower with CHG soap. Do not  wear jewelry, make up, nail polish, gel polish, artificial nails, or any other type of covering on natural nails including finger and toenails. If patients have artificial nails, gel coating, etc. that need to be removed by a nail salon please have this removed prior to surgery. Surgery may need to be canceled/delayed if the surgeon/anesthesiologist feels like the patient is unable to be adequately monitored. Do not wear lotions, powders, perfumes, or deodorant. Do not shave 48 hours prior to surgery.   Do not bring valuables to the hospital. [facility] is not responsible for any belongings or valuables. Wear Clean/Comfortable clothing the morning of surgery Remember to brush your teeth WITH YOUR REGULAR TOOTHPASTE.   Please read over the following fact sheets that you were given.   3 days prior to your procedure or After your COVID test   You are not required to quarantine however you are required to wear a well-fitting mask when you are out and around people not in your household. If your mask becomes wet or soiled, replace with a new one.   Wash your hands often with soap and water for 20 seconds or clean your hands with an alcohol-based hand sanitizer that contains at least 60% alcohol.   Do not share personal items.   Notify your provider:  o if you are in close contact with someone who has COVID  o or if you develop a fever of 100.4 or greater, sneezing, cough, sore throat, shortness of breath or body aches.

## 2021-10-02 ENCOUNTER — Other Ambulatory Visit: Payer: Self-pay

## 2021-10-02 ENCOUNTER — Encounter (HOSPITAL_COMMUNITY): Payer: Self-pay

## 2021-10-02 ENCOUNTER — Encounter (HOSPITAL_COMMUNITY)
Admission: RE | Admit: 2021-10-02 | Discharge: 2021-10-02 | Disposition: A | Discharge status: Home / Self Care | Payer: Commercial Managed Care - HMO | Source: Ambulatory Visit | Attending: Surgery | Admitting: Surgery

## 2021-10-02 VITALS — BP 121/76 | HR 91 | Temp 98.2°F | Resp 18 | Ht 61.0 in | Wt 218.5 lb

## 2021-10-02 DIAGNOSIS — Z01818 Encounter for other preprocedural examination: Secondary | ICD-10-CM

## 2021-10-02 DIAGNOSIS — Z01812 Encounter for preprocedural laboratory examination: Secondary | ICD-10-CM | POA: Insufficient documentation

## 2021-10-02 HISTORY — DX: Gastro-esophageal reflux disease without esophagitis: K21.9

## 2021-10-02 HISTORY — DX: Unspecified asthma, uncomplicated: J45.909

## 2021-10-02 LAB — CBC
HCT: 41.1 % (ref 36.0–46.0)
Hemoglobin: 13.6 g/dL (ref 12.0–15.0)
MCH: 29.1 pg (ref 26.0–34.0)
MCHC: 33.1 g/dL (ref 30.0–36.0)
MCV: 87.8 fL (ref 80.0–100.0)
Platelets: 392 10*3/uL (ref 150–400)
RBC: 4.68 MIL/uL (ref 3.87–5.11)
RDW: 12.8 % (ref 11.5–15.5)
WBC: 12.5 10*3/uL — ABNORMAL HIGH (ref 4.0–10.5)
nRBC: 0 % (ref 0.0–0.2)

## 2021-10-02 NOTE — Progress Notes (Signed)
PCP - denies Cardiologist - denies  PPM/ICD - denies   Chest x-ray - denies EKG - 04/30/21 Stress Test - denies ECHO - denies Cardiac Cath - denies  Sleep Study - 2020- again in 2021. Pt OSA+, then had tonsillectomy/adenoidectomy in 2020 and OSA resolved CPAP - no  DM- denies  Blood Thinner Instructions: n/a Aspirin Instructions: n/a  ERAS Protcol - no, NPO   COVID TEST- n/a, ambulatory surgery   Anesthesia review: no  Patient denies shortness of breath, fever, cough and chest pain at PAT appointment   All instructions explained to the patient, with a verbal understanding of the material. Patient agrees to go over the instructions while at home for a better understanding. Patient also instructed to wear a mask in public for 3 days prior to surgery. The opportunity to ask questions was provided.

## 2021-10-02 NOTE — Progress Notes (Signed)
Pt answered "yes" to many of the suicidal ideation questions at PAT. She states that she has no active plan, just suicidal thoughts. SW contacted and said that we could offer for the pt to go to the ED for evaluation. Pt declined. Pt encouraged to go to the ED if thoughts persist or she has an active plan.

## 2021-10-12 ENCOUNTER — Ambulatory Visit (HOSPITAL_COMMUNITY): Payer: Commercial Managed Care - HMO | Admitting: Anesthesiology

## 2021-10-12 ENCOUNTER — Encounter (HOSPITAL_COMMUNITY): Payer: Self-pay | Admitting: Surgery

## 2021-10-12 ENCOUNTER — Other Ambulatory Visit: Payer: Self-pay

## 2021-10-12 ENCOUNTER — Encounter (HOSPITAL_COMMUNITY)
Admission: RE | Disposition: A | Discharge status: Home / Self Care | Payer: Self-pay | Source: Home / Self Care | Attending: Surgery

## 2021-10-12 ENCOUNTER — Ambulatory Visit (HOSPITAL_BASED_OUTPATIENT_CLINIC_OR_DEPARTMENT_OTHER): Payer: Commercial Managed Care - HMO | Admitting: Anesthesiology

## 2021-10-12 ENCOUNTER — Ambulatory Visit (HOSPITAL_COMMUNITY)
Admission: RE | Admit: 2021-10-12 | Discharge: 2021-10-12 | Disposition: A | Discharge status: Home / Self Care | Payer: Commercial Managed Care - HMO | Attending: Surgery | Admitting: Surgery

## 2021-10-12 DIAGNOSIS — K8010 Calculus of gallbladder with chronic cholecystitis without obstruction: Secondary | ICD-10-CM | POA: Insufficient documentation

## 2021-10-12 DIAGNOSIS — K589 Irritable bowel syndrome without diarrhea: Secondary | ICD-10-CM | POA: Insufficient documentation

## 2021-10-12 DIAGNOSIS — K219 Gastro-esophageal reflux disease without esophagitis: Secondary | ICD-10-CM | POA: Diagnosis not present

## 2021-10-12 DIAGNOSIS — K8020 Calculus of gallbladder without cholecystitis without obstruction: Secondary | ICD-10-CM

## 2021-10-12 DIAGNOSIS — F319 Bipolar disorder, unspecified: Secondary | ICD-10-CM | POA: Insufficient documentation

## 2021-10-12 HISTORY — PX: CHOLECYSTECTOMY: SHX55

## 2021-10-12 LAB — POCT PREGNANCY, URINE: Preg Test, Ur: NEGATIVE

## 2021-10-12 SURGERY — LAPAROSCOPIC CHOLECYSTECTOMY
Anesthesia: General

## 2021-10-12 MED ORDER — CHLORHEXIDINE GLUCONATE CLOTH 2 % EX PADS: 6.0000 | MEDICATED_PAD | Freq: Once | CUTANEOUS | Status: DC

## 2021-10-12 MED ORDER — 0.9 % SODIUM CHLORIDE (POUR BTL) OPTIME
TOPICAL | Status: DC | PRN
  Administered 2021-10-12: 1000 mL

## 2021-10-12 MED ORDER — BUPIVACAINE HCL 0.25 % IJ SOLN
INTRAMUSCULAR | Status: DC | PRN
  Administered 2021-10-12: 30 mL

## 2021-10-12 MED ORDER — MIDAZOLAM HCL 2 MG/2ML IJ SOLN
INTRAMUSCULAR | Status: AC
  Filled 2021-10-12: qty 2

## 2021-10-12 MED ORDER — LIDOCAINE 2% (20 MG/ML) 5 ML SYRINGE
INTRAMUSCULAR | Status: DC | PRN
  Administered 2021-10-12: 100 mg via INTRAVENOUS

## 2021-10-12 MED ORDER — HYDROMORPHONE HCL 1 MG/ML IJ SOLN
0.2500 mg | INTRAMUSCULAR | Status: DC | PRN
  Administered 2021-10-12: 0.5 mg via INTRAVENOUS

## 2021-10-12 MED ORDER — ROCURONIUM BROMIDE 10 MG/ML (PF) SYRINGE
PREFILLED_SYRINGE | INTRAVENOUS | Status: AC
  Filled 2021-10-12: qty 30

## 2021-10-12 MED ORDER — ENOXAPARIN SODIUM 40 MG/0.4ML IJ SOSY
PREFILLED_SYRINGE | INTRAMUSCULAR | Status: AC
  Administered 2021-10-12: 40 mg via SUBCUTANEOUS
  Filled 2021-10-12: qty 0.4

## 2021-10-12 MED ORDER — ONDANSETRON HCL 4 MG/2ML IJ SOLN
INTRAMUSCULAR | Status: DC | PRN
  Administered 2021-10-12: 8 mg via INTRAVENOUS

## 2021-10-12 MED ORDER — SODIUM CHLORIDE 0.9 % IR SOLN
Status: DC | PRN
  Administered 2021-10-12: 1000 mL

## 2021-10-12 MED ORDER — FENTANYL CITRATE (PF) 250 MCG/5ML IJ SOLN
INTRAMUSCULAR | Status: DC | PRN
  Administered 2021-10-12: 50 ug via INTRAVENOUS
  Administered 2021-10-12 (×2): 100 ug via INTRAVENOUS

## 2021-10-12 MED ORDER — LACTATED RINGERS IV SOLN: INTRAVENOUS | Status: DC

## 2021-10-12 MED ORDER — ORAL CARE MOUTH RINSE: 15.0000 mL | Freq: Once | OROMUCOSAL | Status: AC

## 2021-10-12 MED ORDER — DOCUSATE SODIUM 100 MG PO CAPS: 100.0000 mg | ORAL_CAPSULE | Freq: Two times a day (BID) | ORAL | 2 refills | Status: DC

## 2021-10-12 MED ORDER — LIDOCAINE 2% (20 MG/ML) 5 ML SYRINGE
INTRAMUSCULAR | Status: AC
  Filled 2021-10-12: qty 5

## 2021-10-12 MED ORDER — PHENYLEPHRINE 40 MCG/ML (10ML) SYRINGE FOR IV PUSH (FOR BLOOD PRESSURE SUPPORT)
PREFILLED_SYRINGE | INTRAVENOUS | Status: DC | PRN
  Administered 2021-10-12: 120 ug via INTRAVENOUS

## 2021-10-12 MED ORDER — PHENYLEPHRINE 40 MCG/ML (10ML) SYRINGE FOR IV PUSH (FOR BLOOD PRESSURE SUPPORT)
PREFILLED_SYRINGE | INTRAVENOUS | Status: AC
  Filled 2021-10-12: qty 10

## 2021-10-12 MED ORDER — DEXMEDETOMIDINE (PRECEDEX) IN NS 20 MCG/5ML (4 MCG/ML) IV SYRINGE
PREFILLED_SYRINGE | INTRAVENOUS | Status: AC
  Filled 2021-10-12: qty 10

## 2021-10-12 MED ORDER — DEXMEDETOMIDINE HCL IN NACL 80 MCG/20ML IV SOLN
INTRAVENOUS | Status: AC
  Filled 2021-10-12: qty 20

## 2021-10-12 MED ORDER — MIDAZOLAM HCL 2 MG/2ML IJ SOLN
INTRAMUSCULAR | Status: DC | PRN
  Administered 2021-10-12: 4 mg via INTRAVENOUS

## 2021-10-12 MED ORDER — DEXAMETHASONE SODIUM PHOSPHATE 10 MG/ML IJ SOLN
INTRAMUSCULAR | Status: DC | PRN
  Administered 2021-10-12: 10 mg via INTRAVENOUS

## 2021-10-12 MED ORDER — ENOXAPARIN SODIUM 40 MG/0.4ML IJ SOSY: 40.0000 mg | PREFILLED_SYRINGE | Freq: Once | INTRAMUSCULAR | Status: AC

## 2021-10-12 MED ORDER — SUGAMMADEX SODIUM 500 MG/5ML IV SOLN
INTRAVENOUS | Status: AC
  Filled 2021-10-12: qty 5

## 2021-10-12 MED ORDER — IBUPROFEN 600 MG PO TABS: 600.0000 mg | ORAL_TABLET | Freq: Four times a day (QID) | ORAL | 1 refills | Status: DC | PRN

## 2021-10-12 MED ORDER — ONDANSETRON HCL 4 MG PO TABS: 4.0000 mg | ORAL_TABLET | Freq: Four times a day (QID) | ORAL | 1 refills | Status: DC | PRN

## 2021-10-12 MED ORDER — METHOCARBAMOL 750 MG PO TABS: 750.0000 mg | ORAL_TABLET | Freq: Four times a day (QID) | ORAL | 1 refills | Status: DC

## 2021-10-12 MED ORDER — CHLORHEXIDINE GLUCONATE 0.12 % MT SOLN
OROMUCOSAL | Status: AC
  Administered 2021-10-12: 15 mL via OROMUCOSAL
  Filled 2021-10-12: qty 15

## 2021-10-12 MED ORDER — PROPOFOL 500 MG/50ML IV EMUL
INTRAVENOUS | Status: DC | PRN
  Administered 2021-10-12: 25 ug/kg/min via INTRAVENOUS

## 2021-10-12 MED ORDER — CHLORHEXIDINE GLUCONATE 0.12 % MT SOLN: 15.0000 mL | Freq: Once | OROMUCOSAL | Status: AC

## 2021-10-12 MED ORDER — PROPOFOL 10 MG/ML IV BOLUS
INTRAVENOUS | Status: DC | PRN
  Administered 2021-10-12: 150 mg via INTRAVENOUS

## 2021-10-12 MED ORDER — ROCURONIUM BROMIDE 10 MG/ML (PF) SYRINGE
PREFILLED_SYRINGE | INTRAVENOUS | Status: AC
  Filled 2021-10-12: qty 10

## 2021-10-12 MED ORDER — CEFAZOLIN SODIUM-DEXTROSE 2-4 GM/100ML-% IV SOLN
INTRAVENOUS | Status: AC
  Filled 2021-10-12: qty 100

## 2021-10-12 MED ORDER — FENTANYL CITRATE (PF) 250 MCG/5ML IJ SOLN
INTRAMUSCULAR | Status: AC
  Filled 2021-10-12: qty 5

## 2021-10-12 MED ORDER — BUPIVACAINE HCL (PF) 0.25 % IJ SOLN
INTRAMUSCULAR | Status: AC
  Filled 2021-10-12: qty 30

## 2021-10-12 MED ORDER — DEXMEDETOMIDINE (PRECEDEX) IN NS 20 MCG/5ML (4 MCG/ML) IV SYRINGE
PREFILLED_SYRINGE | INTRAVENOUS | Status: DC | PRN
  Administered 2021-10-12 (×3): 20 ug via INTRAVENOUS

## 2021-10-12 MED ORDER — SUCCINYLCHOLINE CHLORIDE 200 MG/10ML IV SOSY
PREFILLED_SYRINGE | INTRAVENOUS | Status: DC | PRN
Start: 2021-10-12 — End: 2021-10-12
  Administered 2021-10-12: 120 mg via INTRAVENOUS

## 2021-10-12 MED ORDER — CEFAZOLIN SODIUM-DEXTROSE 2-4 GM/100ML-% IV SOLN
2.0000 g | INTRAVENOUS | Status: AC
  Administered 2021-10-12: 2 g via INTRAVENOUS

## 2021-10-12 MED ORDER — OXYCODONE HCL 5 MG PO TABS: 5.0000 mg | ORAL_TABLET | ORAL | 0 refills | Status: DC | PRN

## 2021-10-12 MED ORDER — ONDANSETRON HCL 4 MG/2ML IJ SOLN
INTRAMUSCULAR | Status: AC
  Filled 2021-10-12: qty 4

## 2021-10-12 MED ORDER — HYDROMORPHONE HCL 1 MG/ML IJ SOLN
INTRAMUSCULAR | Status: AC
  Filled 2021-10-12: qty 1

## 2021-10-12 MED ORDER — ROCURONIUM BROMIDE 10 MG/ML (PF) SYRINGE
PREFILLED_SYRINGE | INTRAVENOUS | Status: DC | PRN
  Administered 2021-10-12: 50 mg via INTRAVENOUS

## 2021-10-12 MED ORDER — SUGAMMADEX SODIUM 200 MG/2ML IV SOLN
INTRAVENOUS | Status: DC | PRN
Start: 2021-10-12 — End: 2021-10-12
  Administered 2021-10-12: 397.2 mg via INTRAVENOUS
  Administered 2021-10-12: 500 mg via INTRAVENOUS

## 2021-10-12 SURGICAL SUPPLY — 32 items
APPLIER CLIP 5 13 M/L LIGAMAX5 (MISCELLANEOUS) ×2
CANISTER SUCT 3000ML PPV (MISCELLANEOUS) ×2 IMPLANT
CHLORAPREP W/TINT 26 (MISCELLANEOUS) ×2 IMPLANT
CLIP APPLIE 5 13 M/L LIGAMAX5 (MISCELLANEOUS) ×1 IMPLANT
COVER SURGICAL LIGHT HANDLE (MISCELLANEOUS) ×2 IMPLANT
DERMABOND ADVANCED (GAUZE/BANDAGES/DRESSINGS) ×1
DERMABOND ADVANCED .7 DNX12 (GAUZE/BANDAGES/DRESSINGS) ×1 IMPLANT
ELECT CAUTERY BLADE 6.4 (BLADE) ×2 IMPLANT
ELECT REM PT RETURN 9FT ADLT (ELECTROSURGICAL) ×2
ELECTRODE REM PT RTRN 9FT ADLT (ELECTROSURGICAL) ×1 IMPLANT
GLOVE SURG ENC MOIS LTX SZ6.5 (GLOVE) ×2 IMPLANT
GLOVE SURG UNDER POLY LF SZ6 (GLOVE) ×2 IMPLANT
GOWN STRL REUS W/ TWL LRG LVL3 (GOWN DISPOSABLE) ×3 IMPLANT
GOWN STRL REUS W/TWL LRG LVL3 (GOWN DISPOSABLE) ×3
KIT BASIN OR (CUSTOM PROCEDURE TRAY) ×2 IMPLANT
KIT TURNOVER KIT B (KITS) ×2 IMPLANT
NS IRRIG 1000ML POUR BTL (IV SOLUTION) ×2 IMPLANT
PAD ARMBOARD 7.5X6 YLW CONV (MISCELLANEOUS) ×2 IMPLANT
PENCIL BUTTON HOLSTER BLD 10FT (ELECTRODE) ×2 IMPLANT
POUCH RETRIEVAL ECOSAC 10 (ENDOMECHANICALS) ×1 IMPLANT
POUCH RETRIEVAL ECOSAC 10MM (ENDOMECHANICALS) ×1
SCISSORS LAP 5X35 DISP (ENDOMECHANICALS) ×2 IMPLANT
SET IRRIG TUBING LAPAROSCOPIC (IRRIGATION / IRRIGATOR) ×1 IMPLANT
SET TUBE SMOKE EVAC HIGH FLOW (TUBING) ×2 IMPLANT
SLEEVE ENDOPATH XCEL 5M (ENDOMECHANICALS) ×4 IMPLANT
SUT MNCRL AB 4-0 PS2 18 (SUTURE) ×3 IMPLANT
SUT VIC AB 0 UR5 27 (SUTURE) ×1 IMPLANT
TOWEL GREEN STERILE FF (TOWEL DISPOSABLE) ×2 IMPLANT
TRAY LAPAROSCOPIC MC (CUSTOM PROCEDURE TRAY) ×2 IMPLANT
TROCAR XCEL BLUNT TIP 100MML (ENDOMECHANICALS) ×2 IMPLANT
TROCAR XCEL NON-BLD 5MMX100MML (ENDOMECHANICALS) ×2 IMPLANT
WATER STERILE IRR 1000ML POUR (IV SOLUTION) ×2 IMPLANT

## 2021-10-12 NOTE — Transfer of Care (Signed)
Immediate Anesthesia Transfer of Care Note  Patient: Chelsea Hoover  Procedure(s) Performed: LAPAROSCOPIC CHOLECYSTECTOMY  Patient Location: PACU  Anesthesia Type:General  Level of Consciousness: awake, alert  and oriented  Airway & Oxygen Therapy: Patient Spontanous Breathing  Post-op Assessment: Report given to RN and Post -op Vital signs reviewed and stable  Post vital signs: Reviewed and stable  Last Vitals:  Vitals Value Taken Time  BP 127/62 10/12/21 1508  Temp 36.6 C 10/12/21 1508  Pulse 64 10/12/21 1518  Resp 14 10/12/21 1518  SpO2 100 % 10/12/21 1518  Vitals shown include unvalidated device data.  Last Pain:  Vitals:   10/12/21 1151  PainSc: 0-No pain         Complications: No notable events documented.

## 2021-10-12 NOTE — Anesthesia Postprocedure Evaluation (Signed)
Anesthesia Post Note  Patient: Chelsea Hoover  Procedure(s) Performed: LAPAROSCOPIC CHOLECYSTECTOMY     Patient location during evaluation: PACU Anesthesia Type: General Level of consciousness: awake Pain management: pain level controlled Vital Signs Assessment: post-procedure vital signs reviewed and stable Respiratory status: spontaneous breathing Cardiovascular status: stable Postop Assessment: no apparent nausea or vomiting Anesthetic complications: no   No notable events documented.  Last Vitals:  Vitals:   10/12/21 1553 10/12/21 1600  BP: (!) 130/95   Pulse: 65 76  Resp: 16 14  Temp:    SpO2: 97% 93%    Last Pain:  Vitals:   10/12/21 1600  PainSc: Asleep                 Dewane Timson

## 2021-10-12 NOTE — Anesthesia Procedure Notes (Addendum)
Procedure Name: Intubation Date/Time: 10/12/2021 2:10 PM Performed by: Minerva Ends, CRNA Pre-anesthesia Checklist: Patient identified, Emergency Drugs available, Suction available and Patient being monitored Patient Re-evaluated:Patient Re-evaluated prior to induction Oxygen Delivery Method: Circle system utilized Preoxygenation: Pre-oxygenation with 100% oxygen Induction Type: IV induction Ventilation: Mask ventilation without difficulty Laryngoscope Size: Mac and 3 Grade View: Grade I Tube type: Oral Tube size: 7.0 mm Number of attempts: 1 Airway Equipment and Method: Stylet Placement Confirmation: ETT inserted through vocal cords under direct vision, positive ETCO2 and breath sounds checked- equal and bilateral Secured at: 21 cm Tube secured with: Tape Dental Injury: Teeth and Oropharynx as per pre-operative assessment

## 2021-10-12 NOTE — Discharge Instructions (Addendum)
May shower beginning 10/13/2021. Do not peel off or scrub skin glue. May allow warm soapy water to run over incision, then rinse and pat dry. Do not soak in any water (tubs, hot tubs, pools, lakes, oceans) for one week.  ° °No lifting greater than 5 pounds for six weeks.  ° °Pain regimen: take over-the-counter tylenol (acetaminophen) 1000mg every six hours, the prescription ibuprofen (600mg) every six hours and the robaxin (methocarbamol) 750mg every six hours. With all three of these, you should be taking something every two hours. Example: tylenol ( acetaminophen) at 8am, ibuprofen at 10am, robaxin (methocarbamol) at 12pm, tylenol (acetaminophen) again at 2pm, ibuprofen again at 4pm, robaxin (methocarbamol) at 6pm. You also have a prescription for oxycodone, which should be taken if the tylenol (acetaminophen), ibuprofen, and robaxin (methocarbamol) are not enough to control your pain. You may take the oxycodone as frequently as every four hours as needed, but if you are taking the other medications as above, you should not need the oxycodone this frequently. You have also been given a prescription for colace (docusate) which is a stool softener. Please take this as prescribed because the oxycodone can cause constipation and the colace (docusate) will minimize or prevent constipation. ° °Call the office at [phone] for temperature greater than 101.5F, worsening pain, redness or warmth at the incision site. ° °Please call [phone] to make an appointment for 2-3 weeks after surgery for wound check.  ° ° °

## 2021-10-12 NOTE — Op Note (Signed)
° °  Operative Note  Date: 10/12/2021  Procedure: laparoscopic cholecystectomy  Pre-op diagnosis: symptomatic cholelithiasis Post-op diagnosis: same  Indication and clinical history: The patient is a 26 y.o. year old female with symptomatic cholelithiasis  Surgeon: Diamantina Monks, MD  Anesthesiologist: Chilton Si, MD Anesthesia: General  Findings:  Specimen: gallbladder EBL: <5cc Drains/Implants: none  Disposition: PACU - hemodynamically stable.  Description of procedure: The patient was positioned supine on the operating room table. Time-out was performed verifying correct patient, procedure, signature of informed consent, and administration of pre-operative antibiotics. General anesthetic induction and intubation were uneventful. The abdomen was prepped and draped in the usual sterile fashion. An infra-umbilical incision was made using an open technique using zero vicryl stay sutures on either side of the fascia and a 6mm Hassan port inserted. After establishing pneumoperitoneum, which the patient tolerated well, the abdominal cavity was inspected and no injury of any intra-abdominal structures was identified. Additional ports were placed under direct visualization and using local anesthetic: two 45mm ports in the right subcostal region and a 61mm port in the epigastric region. The patient was re-positioned to reverse Trendelenburg and right side up. Adhesiolysis was performed to expose the gallbladder, which was then retracted cephalad. The infundibulum was identified and retracted toward the right lower quadrant. The peritoneum was incised over the infundibulum and the triangle of Calot dissected to expose the critical view of safety. With clear identification and isolation of the cystic duct and cystic artery, the cystic artery was doubly clipped and divided. After this, the cystic duct was identified as a single structure entering the gallbladder, and was also doubly clipped and divided. The  gallbladder was dissected off the liver bed using electrocautery and hemostasis of the liver bed was confirmed prior to separation of the final peritoneal attachments of the gallbladder to the liver bed. The gallbladder fossa was irrigated and fluid returned clear. After transection of the final peritoneal attachments, the gallbladder was placed in an endoscopic specimen retrieval bag, removed via the umbilical port site, and sent to pathology as a permanent specimen. The gallbladder fossa was inspected confirming hemostasis, the absence of bile leakage from the cystic duct stump, and correct placement of clips on the cystic artery and cystic duct stumps. The abdomen was desufflated and the fascia of the umbilical port site was closed using the previously placed stay sutures. Additional local anesthetic was administered at the umbilical port site.  The skin of all incisions was closed with 4-0 monocryl. Sterile dressings were applied. All sponge and instrument counts were correct at the conclusion of the procedure. The patient was awakened from anesthesia, extubated uneventfully, and transported to the PACU - hemodynamically stable. There were no complications.    Diamantina Monks, MD General and Trauma Surgery [facility]

## 2021-10-12 NOTE — H&P (Signed)
° °  Keyarah Grunwald is an 26 y.o. female.   HPI: 86F with symptomatic cholelithiasis. The patient has had no hospitalizations, ER visits, surgeries, or newly diagnosed allergies since being seen in the office. Seen by GYN for infectious reasons, completed a course of abx.    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   Asthma    pt states this is exercise-induced asthma, never really uses inhaler   Bipolar 1 disorder ([place])    Cannabis abuse 09/03/2015   Depression    GERD (gastroesophageal reflux disease)    Irritable bowel syndrome    Lyme disease    Mood disorder ([place])    PTSD (post-traumatic stress disorder) 09/01/2015    Past Surgical History:  Procedure Laterality Date   INCISION AND DRAINAGE PERIRECTAL ABSCESS N/A 04/29/2021   Procedure: IRRIGATION AND DEBRIDEMENT PERIRECTAL ABSCESS;  Surgeon: Georganna Skeans, MD;  Location: [place];  Service: General;  Laterality: N/A;   TONSILECTOMY/ADENOIDECTOMY WITH MYRINGOTOMY Bilateral    August 2020   WISDOM TOOTH EXTRACTION  2018    Family History  Problem Relation Age of Onset   Crohn's disease Father    Bipolar disorder Maternal Aunt    Schizophrenia Maternal Grandfather    Bipolar disorder Maternal Grandfather     Social History:  reports that she has never smoked. She has never used smokeless tobacco. She reports that she does not currently use alcohol. She reports current drug use. Drug: Marijuana.  Allergies:  Allergies  Allergen Reactions   Oxycodone Other (See Comments)    Intense vomiting   Lactose Intolerance (Gi) Other (See Comments)    Pain in stomach   Latex Rash    Medications: I have reviewed the patient's current medications.  No results found for this or any previous visit (from the past 48 hour(s)).  No results found.  ROS 10 point review of systems is negative except as listed above in HPI.   Physical Exam Blood pressure 114/62, pulse 96,  temperature 98.2 F (36.8 C), resp. rate 17, height 5\' 1"  (1.549 m), weight 99.3 kg, last menstrual period 09/13/2021, SpO2 93 %. Constitutional: well-developed, well-nourished HEENT: pupils equal, round, reactive to light, 45mm b/l, moist conjunctiva, external inspection of ears and nose normal, hearing intact Oropharynx: normal oropharyngeal mucosa, normal dentition Neck: no thyromegaly, trachea midline, no midline cervical tenderness to palpation Chest: breath sounds equal bilaterally, normal respiratory effort, no midline or lateral chest wall tenderness to palpation/deformity Abdomen: soft, NT, no bruising, no hepatosplenomegaly GU: normal female genitalia  Back: no wounds, no thoracic/lumbar spine tenderness to palpation, no thoracic/lumbar spine stepoffs Rectal: deferred Extremities: 2+ radial and pedal pulses bilaterally, intact motor and sensation bilateral UE and LE, no peripheral edema MSK: normal gait/station, no clubbing/cyanosis of fingers/toes, normal ROM of all four extremities Skin: warm, dry, no rashes Psych: normal memory, normal mood/affect     Assessment/Plan: 86F with symptomatic cholelithiasis. Plan for lap chole. Informed consent was obtained after detailed explanation of risks, including bleeding, infection, biloma, hematoma, injury to common bile duct, need for IOC to delineate anatomy, and need for conversion to open procedure. All questions answered to the patient's satisfaction.   Jesusita Oka, MD General and [place] [facility]

## 2021-10-12 NOTE — Anesthesia Preprocedure Evaluation (Addendum)
Anesthesia Evaluation  ?Patient identified by MRN, date of birth, ID band ?Patient awake ? ? ? ?Reviewed: ?Allergy & Precautions, NPO status , Patient's Chart, lab work & pertinent test results ? ?Airway ?Mallampati: II ? ?TM Distance: >3 FB ? ? ? ? Dental ?  ?Pulmonary ?asthma ,  ?  ?breath sounds clear to auscultation ? ? ? ? ? ? Cardiovascular ?negative cardio ROS ? ? ?Rhythm:Regular Rate:Normal ? ? ?  ?Neuro/Psych ?  ? GI/Hepatic ?Neg liver ROS, GERD  ,  ?Endo/Other  ?negative endocrine ROS ? Renal/GU ?negative Renal ROS  ? ?  ?Musculoskeletal ? ? Abdominal ?  ?Peds ? Hematology ?  ?Anesthesia Other Findings ? ? Reproductive/Obstetrics ? ?  ? ? ? ? ? ? ? ? ? ? ? ? ? ?  ?  ? ? ? ? ? ? ? ? ?Anesthesia Physical ?Anesthesia Plan ? ?ASA: 3 ? ?Anesthesia Plan: General  ? ?Post-op Pain Management:   ? ?Induction: Intravenous ? ?PONV Risk Score and Plan: 3 and Ondansetron, Dexamethasone and Midazolam ? ?Airway Management Planned: Oral ETT ? ?Additional Equipment:  ? ?Intra-op Plan:  ? ?Post-operative Plan: Extubation in OR ? ?Informed Consent: I have reviewed the patients History and Physical, chart, labs and discussed the procedure including the risks, benefits and alternatives for the proposed anesthesia with the patient or authorized representative who has indicated his/her understanding and acceptance.  ? ? ? ?Dental advisory given ? ?Plan Discussed with: CRNA and Anesthesiologist ? ?Anesthesia Plan Comments:   ? ? ? ? ? ? ?Anesthesia Quick Evaluation ? ?

## 2021-10-13 ENCOUNTER — Encounter (HOSPITAL_COMMUNITY): Payer: Self-pay | Admitting: Surgery

## 2021-10-15 LAB — SURGICAL PATHOLOGY

## 2021-11-02 ENCOUNTER — Other Ambulatory Visit: Payer: Self-pay | Admitting: Surgery

## 2021-11-02 DIAGNOSIS — L02215 Cutaneous abscess of perineum: Secondary | ICD-10-CM

## 2021-11-14 ENCOUNTER — Other Ambulatory Visit: Payer: Self-pay

## 2021-11-14 ENCOUNTER — Ambulatory Visit
Admission: RE | Admit: 2021-11-14 | Discharge: 2021-11-14 | Disposition: A | Discharge status: Home / Self Care | Payer: Managed Care, Other (non HMO) | Source: Ambulatory Visit | Attending: Surgery | Admitting: Surgery

## 2021-11-14 DIAGNOSIS — L02215 Cutaneous abscess of perineum: Secondary | ICD-10-CM

## 2021-11-14 MED ORDER — GADOBENATE DIMEGLUMINE 529 MG/ML IV SOLN
20.0000 mL | Freq: Once | INTRAVENOUS | Status: AC | PRN
  Administered 2021-11-14: 20 mL via INTRAVENOUS

## 2021-12-04 ENCOUNTER — Ambulatory Visit: Payer: Self-pay | Admitting: General Surgery

## 2021-12-04 NOTE — H&P (View-Only) (Signed)
?History of Present Illness: ?Chelsea Hoover is a 26 y.o. female who is seen today as an office consultation at the request of Dr. Bobbye Morton for evaluation of perianal fistula ?Patient underwent I&D of perirectal abscess in August 2022.  Since that time, she has had continued drainage from that area.  She subsequently underwent a laparoscopic cholecystectomy by Dr. Bobbye Morton and mentioned to her that she was still having drainage in this area.  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  She is here today for further evaluation and surgical planning.  She reports regular bowel habits and denies any frequent diarrhea or rectal bleeding or abdominal pain. ? ? ?Review of Systems: ?A complete review of systems was obtained from the patient.  I have reviewed this information and discussed as appropriate with the patient.  See HPI as well for other ROS. ? ? ?Medical History: ?Past Medical History:  ?Diagnosis Date  ? Anemia   ? Anxiety   ? Asthma, unspecified asthma severity, unspecified whether complicated, unspecified whether persistent   ? GERD (gastroesophageal reflux disease)   ? Sleep apnea   ? ? ?There is no problem list on file for this patient. ? ? ?Past Surgical History:  ?Procedure Laterality Date  ? LAPAROSCOPIC CHOLECYSTECTOMY  10/12/2021  ? Dr. Bobbye Morton  ? ADENOIDECTOMY    ? INCISION AND DRAINAGE ABSCESS ANAL    ? TONSILLECTOMY    ? wisdom teeth     ?  ? ?Allergies  ?Allergen Reactions  ? Milk Other (See Comments)  ?  "pain in abdomen" reports pt; can eat cheese, etc  ? Oxycodone Other (See Comments)  ?  Intense vomiting  ? Lactose Other (See Comments)  ?  Pain in stomach  ? Latex Rash  ? ? ?Current Outpatient Medications on File Prior to Visit  ?Medication Sig Dispense Refill  ? buPROPion (WELLBUTRIN) 100 MG tablet TAKE 1 TABLET BY MOUTH EVERY MORNING FOR DEPRESSION    ? famotidine (PEPCID) 20 MG tablet Take 20 mg by mouth 2 (two) times daily    ? hydrOXYzine (ATARAX) 25 MG tablet Take by  mouth    ? lamoTRIgine (LAMICTAL) 100 MG tablet TAKE 1 TABLET BY MOUTH TWICE DAILY FOR MOOD    ? pantoprazole (PROTONIX) 20 MG DR tablet Take 20 mg by mouth once daily    ? traZODone (DESYREL) 150 MG tablet Take by mouth    ? ?No current facility-administered medications on file prior to visit.  ? ? ?Family History  ?Problem Relation Age of Onset  ? Obesity Mother   ? Obesity Father   ? Deep vein thrombosis (DVT or abnormal blood clot formation) Father   ? Breast cancer Paternal Aunt   ? Breast cancer Maternal Grandmother   ?  ? ?Social History  ? ?Tobacco Use  ?Smoking Status Never  ?Smokeless Tobacco Never  ?  ? ?Social History  ? ?Socioeconomic History  ? Marital status: Single  ?Tobacco Use  ? Smoking status: Never  ? Smokeless tobacco: Never  ?Substance and Sexual Activity  ? Alcohol use: Yes  ?  Comment: socially  ? Drug use: Yes  ?  Types: Marijuana  ? ? ?Objective:  ? ? ?There were no vitals filed for this visit.  ? ?Exam ?Gen: NAD ?CV: RRR ?Lungs: CTA ?Abd: soft ?Rectal: Left anterior external opening consistent with fistula tract seen on MRI. ? ? ?Labs, Imaging and Diagnostic Testing: ?MRI images personally reviewed.  Patient has a wide, torturous  left anterior fistula tract. ? ?Assessment and Plan:  ?Diagnoses and all orders for this visit: ? ?Anal fistula ? ?  ? ?26 year old female who presents to the office for evaluation of a perianal fistula.  After reviewing the MRI and considering her age and gender, I think the most reasonable approach would be a sphincter sparing procedure to avoid problems with incontinence later in life.  We discussed this in detail.  She understands that there is a higher risk of recurrence with this approach but a lower risk of incontinence.  We discussed the two-stage approach of placing a seton and then coming back several months later for definitive repair.  All questions were answered.  Patient would like to proceed with surgery.  She would like this done as soon as  possible so that she can go on a trip in May. ? ?Rosario Adie, MD ? ?Colorectal and General Surgery ?[facility]  ?

## 2021-12-04 NOTE — H&P (Signed)
?History of Present Illness: ?Chelsea Hoover is a 26 y.o. female who is seen today as an office consultation at the request of Dr. Bobbye Morton for evaluation of perianal fistula ?Patient underwent I&D of perirectal abscess in August 2022.  Since that time, she has had continued drainage from that area.  She subsequently underwent a laparoscopic cholecystectomy by Dr. Bobbye Morton and mentioned to her that she was still having drainage in this area.  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  She is here today for further evaluation and surgical planning.  She reports regular bowel habits and denies any frequent diarrhea or rectal bleeding or abdominal pain. ? ? ?Review of Systems: ?A complete review of systems was obtained from the patient.  I have reviewed this information and discussed as appropriate with the patient.  See HPI as well for other ROS. ? ? ?Medical History: ?Past Medical History:  ?Diagnosis Date  ? Anemia   ? Anxiety   ? Asthma, unspecified asthma severity, unspecified whether complicated, unspecified whether persistent   ? GERD (gastroesophageal reflux disease)   ? Sleep apnea   ? ? ?There is no problem list on file for this patient. ? ? ?Past Surgical History:  ?Procedure Laterality Date  ? LAPAROSCOPIC CHOLECYSTECTOMY  10/12/2021  ? Dr. Bobbye Morton  ? ADENOIDECTOMY    ? INCISION AND DRAINAGE ABSCESS ANAL    ? TONSILLECTOMY    ? wisdom teeth     ?  ? ?Allergies  ?Allergen Reactions  ? Milk Other (See Comments)  ?  "pain in abdomen" reports pt; can eat cheese, etc  ? Oxycodone Other (See Comments)  ?  Intense vomiting  ? Lactose Other (See Comments)  ?  Pain in stomach  ? Latex Rash  ? ? ?Current Outpatient Medications on File Prior to Visit  ?Medication Sig Dispense Refill  ? buPROPion (WELLBUTRIN) 100 MG tablet TAKE 1 TABLET BY MOUTH EVERY MORNING FOR DEPRESSION    ? famotidine (PEPCID) 20 MG tablet Take 20 mg by mouth 2 (two) times daily    ? hydrOXYzine (ATARAX) 25 MG tablet Take by  mouth    ? lamoTRIgine (LAMICTAL) 100 MG tablet TAKE 1 TABLET BY MOUTH TWICE DAILY FOR MOOD    ? pantoprazole (PROTONIX) 20 MG DR tablet Take 20 mg by mouth once daily    ? traZODone (DESYREL) 150 MG tablet Take by mouth    ? ?No current facility-administered medications on file prior to visit.  ? ? ?Family History  ?Problem Relation Age of Onset  ? Obesity Mother   ? Obesity Father   ? Deep vein thrombosis (DVT or abnormal blood clot formation) Father   ? Breast cancer Paternal Aunt   ? Breast cancer Maternal Grandmother   ?  ? ?Social History  ? ?Tobacco Use  ?Smoking Status Never  ?Smokeless Tobacco Never  ?  ? ?Social History  ? ?Socioeconomic History  ? Marital status: Single  ?Tobacco Use  ? Smoking status: Never  ? Smokeless tobacco: Never  ?Substance and Sexual Activity  ? Alcohol use: Yes  ?  Comment: socially  ? Drug use: Yes  ?  Types: Marijuana  ? ? ?Objective:  ? ? ?There were no vitals filed for this visit.  ? ?Exam ?Gen: NAD ?CV: RRR ?Lungs: CTA ?Abd: soft ?Rectal: Left anterior external opening consistent with fistula tract seen on MRI. ? ? ?Labs, Imaging and Diagnostic Testing: ?MRI images personally reviewed.  Patient has a wide, torturous  left anterior fistula tract. ? ?Assessment and Plan:  ?Diagnoses and all orders for this visit: ? ?Anal fistula ? ?  ? ?26 year old female who presents to the office for evaluation of a perianal fistula.  After reviewing the MRI and considering her age and gender, I think the most reasonable approach would be a sphincter sparing procedure to avoid problems with incontinence later in life.  We discussed this in detail.  She understands that there is a higher risk of recurrence with this approach but a lower risk of incontinence.  We discussed the two-stage approach of placing a seton and then coming back several months later for definitive repair.  All questions were answered.  Patient would like to proceed with surgery.  She would like this done as soon as  possible so that she can go on a trip in May. ? ?Rosario Adie, MD ? ?Colorectal and General Surgery ?[facility]  ?

## 2021-12-05 ENCOUNTER — Encounter (HOSPITAL_BASED_OUTPATIENT_CLINIC_OR_DEPARTMENT_OTHER): Payer: Self-pay | Admitting: General Surgery

## 2021-12-05 ENCOUNTER — Other Ambulatory Visit: Payer: Self-pay

## 2021-12-05 NOTE — Progress Notes (Signed)
Spoke w/ via phone for pre-op interview: patient ?Lab needs dos: UPT ?Lab results: EKG 04/30/21 ?COVID test: patient states asymptomatic no test needed. ?Arrive at 0930 12/13/21 ?NPO after MN except clear liquids.Clear liquids from MN until 0830 ?Med rec completed. ?Medications to take morning of surgery: Protonix, Wellbutrin, Vistaril, and Lamictal ?Diabetic medication: NA ?Patient instructed no nail polish to be worn day of surgery. ?Patient instructed to bring photo id and insurance card day of surgery. ?Patient aware to have Driver (ride ) / caregiver for 24 hours after surgery. (Mom to drive) ?Patient Special Instructions: Hold MVI and Advil 5 days prior to sx ?Pre-Op special Istructions: NA ?Patient verbalized understanding of instructions that were given at this phone interview. ?Patient denies shortness of breath, chest pain, fever, cough at this phone interview.  ?

## 2021-12-06 NOTE — Progress Notes (Signed)
Called pt today about having arrive eariler dos per Dr Maisie Fus request. Pt verbalized understanding to arrive at 0745 and npo after mn w/ exception clear liquids until 0745.  ?

## 2021-12-13 ENCOUNTER — Encounter (HOSPITAL_BASED_OUTPATIENT_CLINIC_OR_DEPARTMENT_OTHER): Payer: Self-pay | Admitting: General Surgery

## 2021-12-13 ENCOUNTER — Ambulatory Visit (HOSPITAL_BASED_OUTPATIENT_CLINIC_OR_DEPARTMENT_OTHER): Payer: Commercial Managed Care - HMO | Admitting: Anesthesiology

## 2021-12-13 ENCOUNTER — Ambulatory Visit (HOSPITAL_BASED_OUTPATIENT_CLINIC_OR_DEPARTMENT_OTHER)
Admission: RE | Admit: 2021-12-13 | Discharge: 2021-12-13 | Disposition: A | Discharge status: Home / Self Care | Payer: Commercial Managed Care - HMO | Attending: General Surgery | Admitting: General Surgery

## 2021-12-13 ENCOUNTER — Encounter (HOSPITAL_BASED_OUTPATIENT_CLINIC_OR_DEPARTMENT_OTHER)
Admission: RE | Disposition: A | Discharge status: Home / Self Care | Payer: Self-pay | Source: Home / Self Care | Attending: General Surgery

## 2021-12-13 ENCOUNTER — Other Ambulatory Visit: Payer: Self-pay

## 2021-12-13 DIAGNOSIS — F419 Anxiety disorder, unspecified: Secondary | ICD-10-CM | POA: Insufficient documentation

## 2021-12-13 DIAGNOSIS — F418 Other specified anxiety disorders: Secondary | ICD-10-CM | POA: Diagnosis not present

## 2021-12-13 DIAGNOSIS — K603 Anal fistula: Secondary | ICD-10-CM

## 2021-12-13 DIAGNOSIS — Z9049 Acquired absence of other specified parts of digestive tract: Secondary | ICD-10-CM | POA: Insufficient documentation

## 2021-12-13 DIAGNOSIS — K219 Gastro-esophageal reflux disease without esophagitis: Secondary | ICD-10-CM | POA: Insufficient documentation

## 2021-12-13 DIAGNOSIS — Z6841 Body Mass Index (BMI) 40.0 and over, adult: Secondary | ICD-10-CM | POA: Diagnosis not present

## 2021-12-13 DIAGNOSIS — F319 Bipolar disorder, unspecified: Secondary | ICD-10-CM

## 2021-12-13 DIAGNOSIS — Z9889 Other specified postprocedural states: Secondary | ICD-10-CM | POA: Diagnosis not present

## 2021-12-13 HISTORY — PX: RECTAL EXAM UNDER ANESTHESIA: SHX6399

## 2021-12-13 HISTORY — PX: PLACEMENT OF SETON: SHX6029

## 2021-12-13 HISTORY — DX: Palpitations: R00.2

## 2021-12-13 LAB — POCT PREGNANCY, URINE: Preg Test, Ur: NEGATIVE

## 2021-12-13 SURGERY — PLACEMENT, SETON
Anesthesia: Monitor Anesthesia Care | Site: Rectum

## 2021-12-13 MED ORDER — TRAMADOL HCL 50 MG PO TABS: 50.0000 mg | ORAL_TABLET | Freq: Four times a day (QID) | ORAL | 1 refills | Status: DC | PRN

## 2021-12-13 MED ORDER — SODIUM CHLORIDE 0.9% FLUSH: 3.0000 mL | Freq: Two times a day (BID) | INTRAVENOUS | Status: DC

## 2021-12-13 MED ORDER — FENTANYL CITRATE (PF) 100 MCG/2ML IJ SOLN
INTRAMUSCULAR | Status: DC | PRN
Start: 2021-12-13 — End: 2021-12-13
  Administered 2021-12-13 (×2): 25 ug via INTRAVENOUS

## 2021-12-13 MED ORDER — BUPIVACAINE-EPINEPHRINE 0.5% -1:200000 IJ SOLN
INTRAMUSCULAR | Status: DC | PRN
  Administered 2021-12-13: 30 mL

## 2021-12-13 MED ORDER — OXYCODONE HCL 5 MG/5ML PO SOLN: 5.0000 mg | Freq: Once | ORAL | Status: DC | PRN

## 2021-12-13 MED ORDER — ACETAMINOPHEN 500 MG PO TABS
ORAL_TABLET | ORAL | Status: AC
  Filled 2021-12-13: qty 2

## 2021-12-13 MED ORDER — DEXAMETHASONE SODIUM PHOSPHATE 10 MG/ML IJ SOLN
INTRAMUSCULAR | Status: AC
  Filled 2021-12-13: qty 1

## 2021-12-13 MED ORDER — CELECOXIB 200 MG PO CAPS
ORAL_CAPSULE | ORAL | Status: AC
  Filled 2021-12-13: qty 1

## 2021-12-13 MED ORDER — OXYCODONE HCL 5 MG PO TABS: 5.0000 mg | ORAL_TABLET | Freq: Once | ORAL | Status: DC | PRN

## 2021-12-13 MED ORDER — LIDOCAINE HCL (CARDIAC) PF 100 MG/5ML IV SOSY
PREFILLED_SYRINGE | INTRAVENOUS | Status: DC | PRN
  Administered 2021-12-13: 60 mg via INTRAVENOUS

## 2021-12-13 MED ORDER — ONDANSETRON HCL 4 MG/2ML IJ SOLN
INTRAMUSCULAR | Status: DC | PRN
  Administered 2021-12-13: 4 mg via INTRAVENOUS

## 2021-12-13 MED ORDER — SODIUM CHLORIDE 0.9% FLUSH: 3.0000 mL | INTRAVENOUS | Status: DC | PRN

## 2021-12-13 MED ORDER — SODIUM CHLORIDE 0.9 % IV SOLN: 250.0000 mL | INTRAVENOUS | Status: DC | PRN

## 2021-12-13 MED ORDER — FENTANYL CITRATE (PF) 100 MCG/2ML IJ SOLN
INTRAMUSCULAR | Status: AC
  Filled 2021-12-13: qty 2

## 2021-12-13 MED ORDER — EPHEDRINE 5 MG/ML INJ
INTRAVENOUS | Status: AC
  Filled 2021-12-13: qty 5

## 2021-12-13 MED ORDER — FENTANYL CITRATE (PF) 100 MCG/2ML IJ SOLN: 25.0000 ug | INTRAMUSCULAR | Status: DC | PRN

## 2021-12-13 MED ORDER — ONDANSETRON HCL 4 MG/2ML IJ SOLN
INTRAMUSCULAR | Status: AC
  Filled 2021-12-13: qty 2

## 2021-12-13 MED ORDER — KETAMINE HCL 10 MG/ML IJ SOLN
INTRAMUSCULAR | Status: DC | PRN
  Administered 2021-12-13 (×2): 10 mg via INTRAVENOUS

## 2021-12-13 MED ORDER — GABAPENTIN 300 MG PO CAPS
ORAL_CAPSULE | ORAL | Status: AC
  Filled 2021-12-13: qty 1

## 2021-12-13 MED ORDER — ACETAMINOPHEN 500 MG PO TABS
1000.0000 mg | ORAL_TABLET | ORAL | Status: AC
  Administered 2021-12-13: 1000 mg via ORAL

## 2021-12-13 MED ORDER — GABAPENTIN 300 MG PO CAPS
300.0000 mg | ORAL_CAPSULE | ORAL | Status: AC
  Administered 2021-12-13: 300 mg via ORAL

## 2021-12-13 MED ORDER — ONDANSETRON HCL 4 MG/2ML IJ SOLN: 4.0000 mg | Freq: Once | INTRAMUSCULAR | Status: DC | PRN

## 2021-12-13 MED ORDER — ACETAMINOPHEN 325 MG RE SUPP: 650.0000 mg | RECTAL | Status: DC | PRN

## 2021-12-13 MED ORDER — PROPOFOL 1000 MG/100ML IV EMUL
INTRAVENOUS | Status: AC
  Filled 2021-12-13: qty 100

## 2021-12-13 MED ORDER — PROPOFOL 500 MG/50ML IV EMUL
INTRAVENOUS | Status: DC | PRN
  Administered 2021-12-13: 100 ug/kg/min via INTRAVENOUS

## 2021-12-13 MED ORDER — 0.9 % SODIUM CHLORIDE (POUR BTL) OPTIME
TOPICAL | Status: DC | PRN
  Administered 2021-12-13: 500 mL

## 2021-12-13 MED ORDER — ACETAMINOPHEN 325 MG PO TABS: 650.0000 mg | ORAL_TABLET | ORAL | Status: DC | PRN

## 2021-12-13 MED ORDER — MIDAZOLAM HCL 2 MG/2ML IJ SOLN
INTRAMUSCULAR | Status: AC
  Filled 2021-12-13: qty 2

## 2021-12-13 MED ORDER — MIDAZOLAM HCL 5 MG/5ML IJ SOLN
INTRAMUSCULAR | Status: DC | PRN
  Administered 2021-12-13: 2 mg via INTRAVENOUS

## 2021-12-13 MED ORDER — AMISULPRIDE (ANTIEMETIC) 5 MG/2ML IV SOLN: 10.0000 mg | Freq: Once | INTRAVENOUS | Status: DC | PRN

## 2021-12-13 MED ORDER — LACTATED RINGERS IV SOLN: INTRAVENOUS | Status: DC

## 2021-12-13 MED ORDER — CELECOXIB 200 MG PO CAPS
200.0000 mg | ORAL_CAPSULE | ORAL | Status: AC
  Administered 2021-12-13: 200 mg via ORAL

## 2021-12-13 MED ORDER — KETAMINE HCL 50 MG/5ML IJ SOSY
PREFILLED_SYRINGE | INTRAMUSCULAR | Status: AC
  Filled 2021-12-13: qty 5

## 2021-12-13 MED ORDER — OXYCODONE HCL 5 MG PO TABS: 5.0000 mg | ORAL_TABLET | ORAL | Status: DC | PRN

## 2021-12-13 SURGICAL SUPPLY — 50 items
APL SKNCLS STERI-STRIP NONHPOA (GAUZE/BANDAGES/DRESSINGS) ×2
BENZOIN TINCTURE PRP APPL 2/3 (GAUZE/BANDAGES/DRESSINGS) ×4 IMPLANT
BLADE EXTENDED COATED 6.5IN (ELECTRODE) IMPLANT
BLADE SURG 10 STRL SS (BLADE) IMPLANT
COVER BACK TABLE 60X90IN (DRAPES) ×2 IMPLANT
COVER MAYO STAND STRL (DRAPES) ×2 IMPLANT
DECANTER SPIKE VIAL GLASS SM (MISCELLANEOUS) ×1 IMPLANT
DRAPE LAPAROTOMY 100X72 PEDS (DRAPES) ×2 IMPLANT
DRAPE UTILITY XL STRL (DRAPES) ×2 IMPLANT
DRSG PAD ABDOMINAL 8X10 ST (GAUZE/BANDAGES/DRESSINGS) ×2 IMPLANT
ELECT REM PT RETURN 9FT ADLT (ELECTROSURGICAL) ×2
ELECTRODE REM PT RTRN 9FT ADLT (ELECTROSURGICAL) ×1 IMPLANT
GAUZE 4X4 16PLY ~~LOC~~+RFID DBL (SPONGE) ×2 IMPLANT
GAUZE SPONGE 4X4 12PLY STRL (GAUZE/BANDAGES/DRESSINGS) ×2 IMPLANT
GAUZE SPONGE 4X4 8PLY NS (GAUZE/BANDAGES/DRESSINGS) ×1 IMPLANT
GLOVE BIO SURGEON STRL SZ 6.5 (GLOVE) ×2 IMPLANT
GLOVE BIOGEL PI IND STRL 7.0 (GLOVE) ×1 IMPLANT
GLOVE BIOGEL PI INDICATOR 7.0 (GLOVE) ×1
GLOVE SURG UNDER LTX SZ6.5 (GLOVE) ×2 IMPLANT
GOWN STRL REUS W/TWL XL LVL3 (GOWN DISPOSABLE) ×2 IMPLANT
HYDROGEN PEROXIDE 16OZ (MISCELLANEOUS) ×1 IMPLANT
IV CATH 14GX2 1/4 (CATHETERS) ×1 IMPLANT
IV CATH 18G SAFETY (IV SOLUTION) ×1 IMPLANT
KIT SIGMOIDOSCOPE (SET/KITS/TRAYS/PACK) IMPLANT
KIT TURNOVER CYSTO (KITS) ×2 IMPLANT
LOOP VESSEL MAXI BLUE (MISCELLANEOUS) ×3 IMPLANT
NEEDLE HYPO 22GX1.5 SAFETY (NEEDLE) ×2 IMPLANT
NS IRRIG 500ML POUR BTL (IV SOLUTION) ×2 IMPLANT
PACK BASIN DAY SURGERY FS (CUSTOM PROCEDURE TRAY) ×2 IMPLANT
PAD ARMBOARD 7.5X6 YLW CONV (MISCELLANEOUS) ×2 IMPLANT
PANTS MESH DISP LRG (UNDERPADS AND DIAPERS) ×1 IMPLANT
PANTS MESH DISPOSABLE L (UNDERPADS AND DIAPERS) ×1
PENCIL SMOKE EVACUATOR (MISCELLANEOUS) ×2 IMPLANT
SPONGE HEMORRHOID 8X3CM (HEMOSTASIS) IMPLANT
SPONGE SURGIFOAM ABS GEL 12-7 (HEMOSTASIS) IMPLANT
SUCTION FRAZIER HANDLE 10FR (MISCELLANEOUS)
SUCTION TUBE FRAZIER 10FR DISP (MISCELLANEOUS) IMPLANT
SUT CHROMIC 2 0 SH (SUTURE) IMPLANT
SUT CHROMIC 3 0 SH 27 (SUTURE) IMPLANT
SUT ETHIBOND 0 (SUTURE) ×2 IMPLANT
SUT VIC AB 2-0 SH 27 (SUTURE)
SUT VIC AB 2-0 SH 27XBRD (SUTURE) IMPLANT
SUT VIC AB 3-0 SH 18 (SUTURE) IMPLANT
SUT VIC AB 3-0 SH 27 (SUTURE)
SUT VIC AB 3-0 SH 27XBRD (SUTURE) IMPLANT
SYR CONTROL 10ML LL (SYRINGE) ×2 IMPLANT
TOWEL OR 17X26 10 PK STRL BLUE (TOWEL DISPOSABLE) ×2 IMPLANT
TRAY DSU PREP LF (CUSTOM PROCEDURE TRAY) ×2 IMPLANT
TUBE CONNECTING 12X1/4 (SUCTIONS) ×2 IMPLANT
YANKAUER SUCT BULB TIP NO VENT (SUCTIONS) ×2 IMPLANT

## 2021-12-13 NOTE — Anesthesia Procedure Notes (Signed)
Procedure Name: [place] ?Date/Time: 12/13/2021 10:31 AM ?Performed by: Justice Rocher, CRNA ?Pre-anesthesia Checklist: Timeout performed, Patient being monitored, Suction available, Emergency Drugs available and Patient identified ?Patient Re-evaluated:Patient Re-evaluated prior to induction ?Oxygen Delivery Method: Simple face mask ?Preoxygenation: Pre-oxygenation with 100% oxygen ?Induction Type: IV induction ?Placement Confirmation: breath sounds checked- equal and bilateral, CO2 detector and positive ETCO2 ? ? ? ? ?

## 2021-12-13 NOTE — Op Note (Signed)
12/13/2021 ? ?10:55 AM ? ?PATIENT:  Chelsea Hoover  26 y.o. female ? ?Patient Care Team: ?Patient, No Pcp Per (Inactive) as PCP - General (General Practice) ? ?PRE-OPERATIVE DIAGNOSIS:  ANAL FISTULA ? ?POST-OPERATIVE DIAGNOSIS:  ANAL FISTULA ? ?PROCEDURE:   ?PLACEMENT OF SETON ?ANORECTAL EXAM UNDER ANESTHESIA ? ? Surgeon(s): ?Leighton Ruff, MD ? ?ASSISTANT: none  ? ?ANESTHESIA:   local and MAC ? ?SPECIMEN:  No Specimen ? ?DISPOSITION OF SPECIMEN:  N/A ? ?COUNTS:  YES ? ?PLAN OF CARE: Discharge to home after PACU ? ?PATIENT DISPOSITION:  PACU - hemodynamically stable. ? ?INDICATION: 26 y.o. F with anterior anal fistula ? ? ?OR FINDINGS: L anterior anal fistula ? ?DESCRIPTION: the patient was identified in the preoperative holding area and taken to the OR where they were laid on the operating room table.  MAC anesthesia was induced without difficulty. The patient was then positioned in prone jackknife position with buttocks gently taped apart.  The patient was then prepped and draped in usual sterile fashion.  SCDs were noted to be in place prior to the initiation of anesthesia. A surgical timeout was performed indicating the correct patient, procedure, positioning and need for preoperative antibiotics.  A rectal block was performed using Marcaine with epinephrine.   ? ?I began with a digital rectal exam.  Sphincter tone was normal.  I then placed a Hill-Ferguson anoscope into the anal canal and evaluated this completely.  There was an internal opening noted at anterior midline.  I used an S shaped fistula probe and placed this through the left anterior external opening.  This easily traversed towards anterior midline.  It was brought out through the internal opening and an Ethibond suture was pulled back through.  The suture was then connected to a vessel loop and pulled through the fistula tract to create a seton.  This was secured in place with 4 Ethibond sutures to make a loop.  Additional Marcaine was placed  around the fistula tract for postoperative pain control.  Hemostasis was good at the end of the procedure.  A dressing was applied.  The patient was then awakened from anesthesia and sent to the post anesthesia care unit in stable condition.  All counts were correct per operating room staff. ? ?Rosario Adie, MD ? ?Colorectal and General Surgery ?[facility]     ?

## 2021-12-13 NOTE — Anesthesia Postprocedure Evaluation (Signed)
Anesthesia Post Note ? ?Patient: Chelsea Hoover ? ?Procedure(s) Performed: PLACEMENT OF SETON (Rectum) ?ANORECTAL EXAM UNDER ANESTHESIA (Rectum) ? ?  ? ?Patient location during evaluation: PACU ?Anesthesia Type: MAC ?Level of consciousness: awake and alert ?Pain management: pain level controlled ?Vital Signs Assessment: post-procedure vital signs reviewed and stable ?Respiratory status: spontaneous breathing, nonlabored ventilation and respiratory function stable ?Cardiovascular status: blood pressure returned to baseline and stable ?Postop Assessment: no apparent nausea or vomiting ?Anesthetic complications: no ? ? ?No notable events documented. ? ?Last Vitals:  ?Vitals:  ? 12/13/21 1145 12/13/21 1200  ?BP: 127/88 129/86  ?Pulse: 77 86  ?Resp: 11 18  ?Temp:  36.7 ?C  ?SpO2: 100% 98%  ?  ?Last Pain:  ?Vitals:  ? 12/13/21 1200  ?TempSrc:   ?PainSc: 0-No pain  ? ? ?  ?  ?  ?  ?  ?  ? ?Lidia Collum ? ? ? ? ?

## 2021-12-13 NOTE — Discharge Instructions (Addendum)
Beginning the day after surgery: ? ?You may sit in a tub of warm water 2-3 times a day to relieve discomfort. ? ?Eat a regular diet high in fiber.  Avoid foods that give you constipation or diarrhea.  Avoid foods that are difficult to digest, such as seeds, nuts, corn or popcorn. ? ?Do not go any longer than 2 days without a bowel movement.  You may take a dose of Milk of Magnesia if you become constipated.   ? ?Drink 6-8 glasses of water daily. ? ?Walking is encouraged.  Avoid strenuous activity and heavy lifting for one month after surgery.   ? ?Call the office if you have any questions or concerns.  Call immediately if you develop: ? ?Excessive rectal bleeding (more than a cup or passing large clots) ?Increased discomfort ?Fever greater than 100 F ?Difficulty urinating  ? ? ?Post Anesthesia Home Care Instructions ? ?Activity: ?Get plenty of rest for the remainder of the day. A responsible individual must stay with you for 24 hours following the procedure.  ?For the next 24 hours, DO NOT: ?-Drive a car ?-Advertising copywriter ?-Drink alcoholic beverages ?-Take any medication unless instructed by your physician ?-Make any legal decisions or sign important papers. ? ?Meals: ?Start with liquid foods such as gelatin or soup. Progress to regular foods as tolerated. Avoid greasy, spicy, heavy foods. If nausea and/or vomiting occur, drink only clear liquids until the nausea and/or vomiting subsides. Call your physician if vomiting continues. ? ?Special Instructions/Symptoms: ?Your throat may feel dry or sore from the anesthesia or the breathing tube placed in your throat during surgery. If this causes discomfort, gargle with warm salt water. The discomfort should disappear within 24 hours. ? ?If you had a scopolamine patch placed behind your ear for the management of post- operative nausea and/or vomiting: ? ?1. The medication in the patch is effective for 72 hours, after which it should be removed.  Wrap patch in a tissue  and discard in the trash. Wash hands thoroughly with soap and water. ?2. You may remove the patch earlier than 72 hours if you experience unpleasant side effects which may include dry mouth, dizziness or visual disturbances. ?3. Avoid touching the patch. Wash your hands with soap and water after contact with the patch. ? ?May take Tylenol beginning at 3 PM as needed for pain/soreness. ?

## 2021-12-13 NOTE — Interval H&P Note (Signed)
History and Physical Interval Note: ? ?12/13/2021 ?8:21 AM ? ?Chelsea Hoover  has presented today for surgery, with the diagnosis of ANAL FISTULA.  The various methods of treatment have been discussed with the patient and family. After consideration of risks, benefits and other options for treatment, the patient has consented to  Procedure(s): ?PLACEMENT OF SETON (N/A) ?ANORECTAL EXAM UNDER ANESTHESIA (N/A) as a surgical intervention.  The patient's history has been reviewed, patient examined, no change in status, stable for surgery.  I have reviewed the patient's chart and labs.  Questions were answered to the patient's satisfaction.   ? ? ?[name], MD ? ?Colorectal and General Surgery ?[facility]  ? ? ?

## 2021-12-13 NOTE — Transfer of Care (Signed)
Immediate Anesthesia Transfer of Care Note ? ?Patient: Chelsea Hoover ? ?Procedure(s) Performed: Procedure(s) (LRB): ?PLACEMENT OF SETON (N/A) ?ANORECTAL EXAM UNDER ANESTHESIA (N/A) ? ?Patient Location: PACU ? ?Anesthesia Type: MAC ? ?Level of Consciousness: awake, sedated, patient cooperative and responds to stimulation ? ?Airway & Oxygen Therapy: Patient Spontanous Breathing and Patient connected to [place] 02  ? ?Post-op Assessment: Report given to PACU RN, Post -op Vital signs reviewed and stable and Patient moving all extremities ? ?Post vital signs: Reviewed and stable ? ?Complications: No apparent anesthesia complications ?

## 2021-12-13 NOTE — Anesthesia Preprocedure Evaluation (Signed)
Anesthesia Evaluation  ?Patient identified by MRN, date of birth, ID band ?Patient awake ? ? ? ?Reviewed: ?Allergy & Precautions, NPO status , Patient's Chart, lab work & pertinent test results ? ?History of Anesthesia Complications ?Negative for: history of anesthetic complications ? ?Airway ?Mallampati: III ? ?TM Distance: >3 FB ?Neck ROM: Full ? ? ? Dental ? ?(+) Teeth Intact ?  ?Pulmonary ?asthma ,  ?  ?Pulmonary exam normal ? ? ? ? ? ? ? Cardiovascular ?negative cardio ROS ?Normal cardiovascular exam ? ? ?  ?Neuro/Psych ?Anxiety Depression Bipolar Disorder negative neurological ROS ?   ? GI/Hepatic ?GERD  ,(+)  ?  ? substance abuse ? marijuana use,   ?Endo/Other  ?Morbid obesity ? Renal/GU ?negative Renal ROS  ?negative genitourinary ?  ?Musculoskeletal ?negative musculoskeletal ROS ?(+)  ? Abdominal ?  ?Peds ? Hematology ?negative hematology ROS ?(+)   ?Anesthesia Other Findings ? ? Reproductive/Obstetrics ? ?  ? ? ? ? ? ? ? ? ? ? ? ? ? ?  ?  ? ? ? ? ? ?Anesthesia Physical ?Anesthesia Plan ? ?ASA: 3 ? ?Anesthesia Plan: MAC  ? ?Post-op Pain Management: Tylenol PO (pre-op)*, Celebrex PO (pre-op)* and Gabapentin PO (pre-op)*  ? ?Induction: Intravenous ? ?PONV Risk Score and Plan: 2 and Propofol infusion, TIVA and Treatment may vary due to age or medical condition ? ?Airway Management Planned: Natural Airway, Nasal Cannula and Simple Face Mask ? ?Additional Equipment: None ? ?Intra-op Plan:  ? ?Post-operative Plan:  ? ?Informed Consent: I have reviewed the patients History and Physical, chart, labs and discussed the procedure including the risks, benefits and alternatives for the proposed anesthesia with the patient or authorized representative who has indicated his/her understanding and acceptance.  ? ? ? ? ? ?Plan Discussed with:  ? ?Anesthesia Plan Comments:   ? ? ? ? ? ?Anesthesia Quick Evaluation ? ?

## 2021-12-17 ENCOUNTER — Encounter (HOSPITAL_BASED_OUTPATIENT_CLINIC_OR_DEPARTMENT_OTHER): Payer: Self-pay | Admitting: General Surgery

## 2021-12-26 ENCOUNTER — Other Ambulatory Visit: Payer: Self-pay | Admitting: Gastroenterology

## 2021-12-26 DIAGNOSIS — K603 Anal fistula: Secondary | ICD-10-CM

## 2022-01-15 ENCOUNTER — Other Ambulatory Visit (INDEPENDENT_AMBULATORY_CARE_PROVIDER_SITE_OTHER): Payer: Commercial Managed Care - HMO

## 2022-01-15 ENCOUNTER — Ambulatory Visit (INDEPENDENT_AMBULATORY_CARE_PROVIDER_SITE_OTHER): Payer: Commercial Managed Care - HMO | Admitting: Physician Assistant

## 2022-01-15 ENCOUNTER — Encounter: Payer: Self-pay | Admitting: Physician Assistant

## 2022-01-15 VITALS — BP 118/81 | HR 84 | Ht 61.0 in | Wt 214.0 lb

## 2022-01-15 DIAGNOSIS — R413 Other amnesia: Secondary | ICD-10-CM

## 2022-01-15 LAB — TSH: TSH: 1.88 u[IU]/mL (ref 0.35–5.50)
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
## 2022-01-15 NOTE — Progress Notes (Signed)
Please inform the patient that her thyroid levels are normal, thank you

## 2022-01-16 DIAGNOSIS — R413 Other amnesia: Secondary | ICD-10-CM | POA: Insufficient documentation

## 2022-01-16 LAB — ANEMIA PANEL
Ferritin: 33 ng/mL (ref 15–150)
Folate, Hemolysate: 546 ng/mL
Iron Saturation: 9 % — CL (ref 15–55)
Iron: 30 ug/dL (ref 27–159)
Total Iron Binding Capacity: 339 ug/dL (ref 250–450)
UIBC: 309 ug/dL (ref 131–425)
Vitamin B-12: 566 pg/mL (ref 232–1245)

## 2022-01-16 NOTE — Progress Notes (Signed)
Please inform patient her Iron saturation is low, needs to be replenished,needs T  Alk to her to her PCP thanks

## 2022-01-20 LAB — VITAMIN B1: Vitamin B1 (Thiamine): 23 nmol/L (ref 8–30)

## 2022-01-29 ENCOUNTER — Ambulatory Visit
Admission: RE | Admit: 2022-01-29 | Discharge: 2022-01-29 | Disposition: A | Discharge status: Home / Self Care | Payer: Commercial Managed Care - HMO | Source: Ambulatory Visit | Attending: Gastroenterology | Admitting: Gastroenterology

## 2022-01-29 ENCOUNTER — Inpatient Hospital Stay
Admission: RE | Admit: 2022-01-29 | Discharge: 2022-01-29 | Disposition: A | Discharge status: Home / Self Care | Payer: Commercial Managed Care - HMO | Source: Ambulatory Visit | Attending: Gastroenterology | Admitting: Gastroenterology

## 2022-01-29 DIAGNOSIS — K603 Anal fistula: Secondary | ICD-10-CM

## 2022-01-29 MED ORDER — GLUCAGON HCL RDNA (DIAGNOSTIC) 1 MG IJ SOLR
1.0000 mg | Freq: Once | INTRAMUSCULAR | Status: AC
  Administered 2022-01-29: 1 mg via INTRAMUSCULAR

## 2022-01-29 MED ORDER — GLUCAGON HCL RDNA (DIAGNOSTIC) 1 MG IJ SOLR: 1.0000 mg | Freq: Once | INTRAMUSCULAR | Status: DC

## 2022-01-29 MED ORDER — GADOBENATE DIMEGLUMINE 529 MG/ML IV SOLN
20.0000 mL | Freq: Once | INTRAVENOUS | Status: AC | PRN
  Administered 2022-01-29: 20 mL via INTRAVENOUS

## 2022-02-06 ENCOUNTER — Ambulatory Visit
Admission: RE | Admit: 2022-02-06 | Discharge: 2022-02-06 | Disposition: A | Discharge status: Home / Self Care | Payer: Commercial Managed Care - HMO | Source: Ambulatory Visit | Attending: Physician Assistant | Admitting: Physician Assistant

## 2022-02-06 DIAGNOSIS — R413 Other amnesia: Secondary | ICD-10-CM

## 2022-02-06 MED ORDER — GADOBENATE DIMEGLUMINE 529 MG/ML IV SOLN
20.0000 mL | Freq: Once | INTRAVENOUS | Status: AC | PRN
Start: 2022-02-06 — End: 2022-02-06
  Administered 2022-02-06: 20 mL via INTRAVENOUS

## 2022-02-10 NOTE — Progress Notes (Signed)
Please inform patient her MRI brain is completely normal, thanks

## 2022-02-12 ENCOUNTER — Ambulatory Visit: Payer: Self-pay | Admitting: General Surgery

## 2022-02-12 NOTE — H&P (Signed)
PROVIDER:  Elenora Gamma, MD  MRN: [id number] DOB: 10/20/1995 DATE OF ENCOUNTER: 02/12/2022 Interval History:   Chelsea Hoover is a 26 y.o. female who underwent I&D of perirectal abscess in August 2022.  Since that time, she has had continued drainage from that area.  She subsequently underwent a laparoscopic cholecystectomy by Dr. Bedelia Person and mentioned to her that she was still having drainage in this area.  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  She underwent an exam under anesthesia with seton placement on December 12, 2021.  She is here today for follow-up.  She is having minimal drainage and pain at this time. 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   Asthma    pt states this is exercise-induced asthma, never really uses inhaler   Bipolar 1 disorder (HCC)    Cannabis abuse 09/03/2015   Depression    GERD (gastroesophageal reflux disease)    Irritable bowel syndrome    Lyme disease    Mood disorder (HCC)    Palpitations    Per pt long time ago   PTSD (post-traumatic stress disorder) 09/01/2015   Past Surgical History:  Procedure Laterality Date   CHOLECYSTECTOMY N/A 10/12/2021   Procedure: LAPAROSCOPIC CHOLECYSTECTOMY;  Surgeon: Diamantina Monks, MD;  Location: [facility] OR;  Service: General;  Laterality: N/A;   INCISION AND DRAINAGE PERIRECTAL ABSCESS N/A 04/29/2021   Procedure: IRRIGATION AND DEBRIDEMENT PERIRECTAL ABSCESS;  Surgeon: Violeta Gelinas, MD;  Location: [facility] OR;  Service: General;  Laterality: N/A;   PLACEMENT OF SETON N/A 12/13/2021   Procedure: PLACEMENT OF SETON;  Surgeon: Romie Levee, MD;  Location: [facility];  Service: General;  Laterality: N/A;   RECTAL EXAM UNDER ANESTHESIA N/A 12/13/2021   Procedure: ANORECTAL EXAM UNDER ANESTHESIA;  Surgeon: Romie Levee, MD;  Location: [facility];  Service: General;  Laterality: N/A;    TONSILECTOMY/ADENOIDECTOMY WITH MYRINGOTOMY Bilateral    August 2020   WISDOM TOOTH EXTRACTION  2018   Social History   Socioeconomic History   Marital status: Single    Spouse name: Not on file   Number of children: 0   Years of education: Not on file   Highest education level: Not on file  Occupational History   Not on file  Tobacco Use   Smoking status: Never   Smokeless tobacco: Never  Vaping Use   Vaping Use: Never used  Substance and Sexual Activity   Alcohol use: Not Currently   Drug use: Not Currently    Types: Marijuana    Comment: maybe every other week per pt, last use ~ 12/10/21 per pt   Sexual activity: Yes    Birth control/protection: Pill  Other Topics Concern   Not on file  Social History Narrative   Left handed   Lives with mother and sisters   Two story   Caffeine 1 or 2 daily   Social Determinants of Health   Financial Resource Strain: Not on file  Food Insecurity: Not on file  Transportation Needs: Not on file  Physical Activity: Not on file  Stress: Not on file  Social Connections: Not on file  Intimate Partner Violence: Not on file   Family History  Problem Relation Age of Onset   Crohn's disease Father    Bipolar disorder Maternal Aunt    Schizophrenia Maternal Grandfather    Bipolar disorder Maternal Grandfather     Current  Outpatient Medications:    albuterol (VENTOLIN HFA) 108 (90 Base) MCG/ACT inhaler, Inhale 1 puff into the lungs as needed for shortness of breath., Disp: , Rfl:    buPROPion (WELLBUTRIN) 100 MG tablet, Take 150 mg by mouth every morning., Disp: , Rfl:    calcium-vitamin D (OSCAL WITH D) 500-5 MG-MCG tablet, Take 1 tablet by mouth. (Patient not taking: Reported on 01/15/2022), Disp: , Rfl:    clobetasol cream (TEMOVATE) 3.57 %, Apply 1 application. topically 2 (two) times daily., Disp: , Rfl:    famotidine (PEPCID) 40 MG tablet, Take 40 mg by mouth at bedtime., Disp: , Rfl:    hydrOXYzine (ATARAX/VISTARIL) 25 MG  tablet, Take 25 mg by mouth daily as needed for anxiety., Disp: , Rfl:    ibuprofen (ADVIL) 600 MG tablet, Take 1 tablet (600 mg total) by mouth every 6 (six) hours as needed., Disp: 120 tablet, Rfl: 1   lamoTRIgine (LAMICTAL) 100 MG tablet, Take 1 tablet (100 mg total) by mouth 2 (two) times daily., Disp: 60 tablet, Rfl: 0   methocarbamol (ROBAXIN) 500 MG tablet, Take 500 mg by mouth. 2 times daily as needed, Disp: , Rfl:    Multiple Vitamin (MULTIVITAMIN WITH MINERALS) TABS tablet, Take 1 tablet by mouth daily. (Patient not taking: Reported on 01/15/2022), Disp: , Rfl:    sucralfate (CARAFATE) 1 g tablet, Take 1 g by mouth 4 (four) times daily -  with meals and at bedtime., Disp: , Rfl:    tinidazole (TINDAMAX) 500 MG tablet, Take 500 mg by mouth daily with breakfast. 2 x daily, Disp: , Rfl:    traMADol (ULTRAM) 50 MG tablet, Take 1 tablet (50 mg total) by mouth every 6 (six) hours as needed. (Patient not taking: Reported on 01/15/2022), Disp: 15 tablet, Rfl: 1   traZODone (DESYREL) 150 MG tablet, Take 0.5 tablets (75 mg total) by mouth at bedtime., Disp: 15 tablet, Rfl: 0 Allergies  Allergen Reactions   Oxycodone Other (See Comments)    Intense vomiting   Lactose Intolerance (Gi) Other (See Comments)    Pain in stomach   Latex Rash   Review of Systems - Negative except as stated above  Physical Examination:   Gen: NAD Abd: soft Incision: seton in place.  Assessment and Plan:   Chelsea Hoover is a 26 y.o. female who underwent seton placement in mid April 2023.  There are no diagnoses linked to this encounter.    We have discussed the lift procedure as well as fistulotomy and MAF.  I recommended proceeding with LIFT and then MAF if this did not work.  Success rate is 80%.  Other risks include bleeding, infection and pain.  She has a 2 week break in July and we will try to get it scheduled in that time period.  No follow-ups on file.   The plan was discussed in detail with the  patient today, who expressed understanding.  The patient has my contact information, and understands to call me with any additional questions or concerns in the interval.  I would be happy to see the patient back sooner if the need arises.   Rosario Adie, MD Colon and Rectal Surgery [facility]

## 2022-02-15 ENCOUNTER — Ambulatory Visit (INDEPENDENT_AMBULATORY_CARE_PROVIDER_SITE_OTHER): Payer: Commercial Managed Care - HMO | Admitting: Physician Assistant

## 2022-02-15 ENCOUNTER — Encounter: Payer: Self-pay | Admitting: Physician Assistant

## 2022-02-15 VITALS — BP 111/78 | HR 92 | Ht 61.0 in | Wt 216.0 lb

## 2022-02-15 DIAGNOSIS — R413 Other amnesia: Secondary | ICD-10-CM

## 2022-02-15 NOTE — Progress Notes (Signed)
Assessment/Plan:   Memory difficulties MRI brain reviewed by me is without abnormalities.    Recommendations:   Follow up as needed  Recommend to continue to address psychiatric/psychological issues.    Case discussed with Dr. Karel Jarvis who agrees with the plan     Subjective:    Chelsea Hoover is a very pleasant 26 y.o. RH female  seen today in follow up for memory loss. This patient is accompanied in the office by her who supplements the history.  Previous records as well as any outside records available were reviewed prior to todays visit.  Patient was last seen at our office on  at which time her  Patient is currently on    Any changes in memory since last visit? Patient lives with: Spouse who noticed changes as well.  Patient lives alone repeats oneself?  Disoriented when walking into a room?  Patient denies   Leaving objects in unusual places?  Patient denies   Ambulates  with difficulty?   Patient denies   Recent falls?  Patient denies   Any head injuries?  Patient denies   History of seizures?   Patient denies   Wandering behavior?  Patient denies   Patient drives?   Patient no longer drives  Patient drives with assistance  Patient uses [employer] to drive   Any mood changes such irritability agitation?  Patient denies   Any history of depression?:  Patient denies   Hallucinations?  Patient denies   Paranoia?  Patient denies   Patient reports that he sleeps well without vivid dreams, REM behavior or sleepwalking   Patient reports vivid dreams   History of sleep apnea?  Patient denies   Any hygiene concerns?  Patient denies   Independent of bathing and dressing?  Endorsed  Does the patient needs help with medications?  pillbox pill pack  Patient denies   Who is in charge of the finances?  Patient is in charge   is in charge   assists the patient  and denies missing any bills   occasionally misses a payment. Any changes in appetite?  Patient denies   Patient have  trouble swallowing? Patient denies   Does the patient cook?  Patient denies   Any kitchen accidents such as leaving the stove on? Patient denies   Any headaches?  Patient denies   The double vision? Patient denies   Any focal numbness or tingling?  Patient denies   Chronic back pain Patient denies   Unilateral weakness?  Patient denies   Any tremors?  Patient denies   Any history of anosmia?  Patient denies   Any incontinence of urine?  Patient denies   Any bowel dysfunction?   Patient denies     Constipation     diarrhea   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   Asthma    pt states this is exercise-induced asthma, never really uses inhaler   Bipolar 1 disorder (HCC)    Cannabis abuse 09/03/2015   Depression    GERD (gastroesophageal reflux disease)    Irritable bowel syndrome    Lyme disease    Mood disorder (HCC)    Palpitations    Per pt long time ago   PTSD (post-traumatic stress disorder) 09/01/2015     Past Surgical History:  Procedure Laterality Date   CHOLECYSTECTOMY N/A 10/12/2021   Procedure: LAPAROSCOPIC CHOLECYSTECTOMY;  Surgeon: Diamantina Monks, MD;  Location: [facility]  OR;  Service: General;  Laterality: N/A;   INCISION AND DRAINAGE PERIRECTAL ABSCESS N/A 04/29/2021   Procedure: IRRIGATION AND DEBRIDEMENT PERIRECTAL ABSCESS;  Surgeon: Violeta Gelinas, MD;  Location: [facility] OR;  Service: General;  Laterality: N/A;   PLACEMENT OF SETON N/A 12/13/2021   Procedure: PLACEMENT OF SETON;  Surgeon: Romie Levee, MD;  Location: [facility];  Service: General;  Laterality: N/A;   RECTAL EXAM UNDER ANESTHESIA N/A 12/13/2021   Procedure: ANORECTAL EXAM UNDER ANESTHESIA;  Surgeon: Romie Levee, MD;  Location: [facility];  Service: General;  Laterality: N/A;   TONSILECTOMY/ADENOIDECTOMY WITH MYRINGOTOMY Bilateral    August 2020   WISDOM TOOTH EXTRACTION  2018     PREVIOUS MEDICATIONS:    CURRENT MEDICATIONS:  Outpatient Encounter Medications as of 02/15/2022  Medication Sig   albuterol (VENTOLIN HFA) 108 (90 Base) MCG/ACT inhaler Inhale 1 puff into the lungs as needed for shortness of breath.   buPROPion (WELLBUTRIN) 100 MG tablet Take 150 mg by mouth every morning.   calcium-vitamin D (OSCAL WITH D) 500-5 MG-MCG tablet Take 1 tablet by mouth. (Patient not taking: Reported on 01/15/2022)   clobetasol cream (TEMOVATE) 0.05 % Apply 1 application. topically 2 (two) times daily.   famotidine (PEPCID) 40 MG tablet Take 40 mg by mouth at bedtime.   hydrOXYzine (ATARAX/VISTARIL) 25 MG tablet Take 25 mg by mouth daily as needed for anxiety.   ibuprofen (ADVIL) 600 MG tablet Take 1 tablet (600 mg total) by mouth every 6 (six) hours as needed.   lamoTRIgine (LAMICTAL) 100 MG tablet Take 1 tablet (100 mg total) by mouth 2 (two) times daily.   methocarbamol (ROBAXIN) 500 MG tablet Take 500 mg by mouth. 2 times daily as needed   Multiple Vitamin (MULTIVITAMIN WITH MINERALS) TABS tablet Take 1 tablet by mouth daily. (Patient not taking: Reported on 01/15/2022)   sucralfate (CARAFATE) 1 g tablet Take 1 g by mouth 4 (four) times daily -  with meals and at bedtime.   tinidazole (TINDAMAX) 500 MG tablet Take 500 mg by mouth daily with breakfast. 2 x daily   traMADol (ULTRAM) 50 MG tablet Take 1 tablet (50 mg total) by mouth every 6 (six) hours as needed. (Patient not taking: Reported on 01/15/2022)   traZODone (DESYREL) 150 MG tablet Take 0.5 tablets (75 mg total) by mouth at bedtime.   No facility-administered encounter medications on file as of 02/15/2022.     Objective:     PHYSICAL EXAMINATION:    VITALS:  There were no vitals filed for this visit.  GEN:  The patient appears stated age and is in NAD. HEENT:  Normocephalic, atraumatic.   Neurological examination:  General: NAD, well-groomed, appears stated age. Orientation: The patient is alert. Oriented to person, place and  date Cranial nerves: There is good facial symmetry.The speech is fluent and clear. No aphasia or dysarthria. Fund of knowledge is appropriate. Recent memory impaired and remote memory is normal.  Attention and concentration are normal.  Able to name objects and repeat phrases.  Hearing is intact to conversational tone.    Sensation: Sensation is intact to light touch throughout Motor: Strength is at least antigravity x4. Tremors: none  DTR's 2/4 in UE/LE      01/16/2022    1:00 PM  Montreal Cognitive Assessment   Visuospatial/ Executive (0/5) 5  Naming (0/3) 3  Attention: Read list of digits (0/2) 2  Attention: Read list of letters (0/1) 1  Attention: Serial 7  subtraction starting at 100 (0/3) 3  Language: Repeat phrase (0/2) 2  Language : Fluency (0/1) 1  Abstraction (0/2) 1  Delayed Recall (0/5) 5  Orientation (0/6) 6  Total 29  Adjusted Score (based on education) 29        No data to display             Movement examination: Tone: There is normal tone in the UE/LE Abnormal movements:  no tremor.  No myoclonus.  No asterixis.   Coordination:  There is no decremation with RAM's. Normal finger to nose  Gait and Station: The patient has no difficulty arising out of a deep-seated chair without the use of the hands. The patient's stride length is good.  Gait is cautious and narrow.   Thank you for allowing [place] the opportunity to participate in the care of this nice patient. Please do not hesitate to contact us for any questions or concerns.   Total time spent on today's visit was *** minutes dedicated to this patient today, preparing to see patient, examining the patient, ordering tests and/or medications and counseling the patient, documenting clinical information in the EHR or other health record, independently interpreting results and communicating results to the patient/family, discussing treatment and goals, answering patient's questions and coordinating care.  Cc:  Center,  [facility]  Marlowe Kays 02/15/2022 12:41 PM   Cc:  Center, [facility] Marlowe Kays, [place]

## 2022-02-16 NOTE — Patient Instructions (Signed)
Follow up as needed

## 2022-04-10 ENCOUNTER — Encounter (HOSPITAL_BASED_OUTPATIENT_CLINIC_OR_DEPARTMENT_OTHER): Payer: Self-pay | Admitting: General Surgery

## 2022-04-12 ENCOUNTER — Encounter (HOSPITAL_BASED_OUTPATIENT_CLINIC_OR_DEPARTMENT_OTHER): Payer: Self-pay | Admitting: General Surgery

## 2022-04-12 NOTE — Progress Notes (Signed)
Spoke w/ via phone for pre-op interview--- pt Lab needs dos----   urine preg            Lab results------ no COVID test -----patient states asymptomatic no test needed Arrive at -------  0600 on 04-19-2022 NPO after MN NO Solid Food.  Clear liquids from MN until--- 0500 Med rec completed Medications to take morning of surgery ----- wellbutrin, lamictal Diabetic medication ----- n/a Patient instructed no nail polish to be worn day of surgery Patient instructed to bring photo id and insurance card day of surgery Patient aware to have Driver (ride ) / caregiver for 24 hours after surgery -- mother, nalani Patient Special Instructions ----- asked to bring rescue inhaler dos Pre-Op special Istructions ----- n/a Patient verbalized understanding of instructions that were given at this phone interview. Patient denies shortness of breath, chest pain, fever, cough at this phone interview.

## 2022-04-19 ENCOUNTER — Encounter (HOSPITAL_BASED_OUTPATIENT_CLINIC_OR_DEPARTMENT_OTHER)
Admission: RE | Disposition: A | Discharge status: Home / Self Care | Payer: Self-pay | Source: Ambulatory Visit | Attending: General Surgery

## 2022-04-19 ENCOUNTER — Other Ambulatory Visit: Payer: Self-pay

## 2022-04-19 ENCOUNTER — Ambulatory Visit (HOSPITAL_BASED_OUTPATIENT_CLINIC_OR_DEPARTMENT_OTHER): Payer: Commercial Managed Care - HMO | Admitting: Anesthesiology

## 2022-04-19 ENCOUNTER — Ambulatory Visit (HOSPITAL_BASED_OUTPATIENT_CLINIC_OR_DEPARTMENT_OTHER)
Admission: RE | Admit: 2022-04-19 | Discharge: 2022-04-19 | Disposition: A | Discharge status: Home / Self Care | Payer: Commercial Managed Care - HMO | Source: Ambulatory Visit | Attending: General Surgery | Admitting: General Surgery

## 2022-04-19 ENCOUNTER — Encounter (HOSPITAL_BASED_OUTPATIENT_CLINIC_OR_DEPARTMENT_OTHER): Payer: Self-pay | Admitting: General Surgery

## 2022-04-19 DIAGNOSIS — G4730 Sleep apnea, unspecified: Secondary | ICD-10-CM

## 2022-04-19 DIAGNOSIS — K603 Anal fistula: Secondary | ICD-10-CM | POA: Diagnosis not present

## 2022-04-19 DIAGNOSIS — J45990 Exercise induced bronchospasm: Secondary | ICD-10-CM | POA: Insufficient documentation

## 2022-04-19 DIAGNOSIS — F319 Bipolar disorder, unspecified: Secondary | ICD-10-CM

## 2022-04-19 DIAGNOSIS — K219 Gastro-esophageal reflux disease without esophagitis: Secondary | ICD-10-CM | POA: Insufficient documentation

## 2022-04-19 DIAGNOSIS — Z6841 Body Mass Index (BMI) 40.0 and over, adult: Secondary | ICD-10-CM | POA: Diagnosis not present

## 2022-04-19 DIAGNOSIS — F419 Anxiety disorder, unspecified: Secondary | ICD-10-CM | POA: Diagnosis not present

## 2022-04-19 DIAGNOSIS — J45909 Unspecified asthma, uncomplicated: Secondary | ICD-10-CM | POA: Diagnosis not present

## 2022-04-19 DIAGNOSIS — Z01818 Encounter for other preprocedural examination: Secondary | ICD-10-CM

## 2022-04-19 HISTORY — DX: Generalized anxiety disorder: F41.1

## 2022-04-19 HISTORY — DX: Obstructive sleep apnea (adult) (pediatric): G47.33

## 2022-04-19 HISTORY — DX: Iron deficiency anemia, unspecified: D50.9

## 2022-04-19 HISTORY — DX: Crohn's disease, unspecified, without complications: K50.90

## 2022-04-19 HISTORY — DX: Anal fistula: K60.3

## 2022-04-19 HISTORY — DX: Major depressive disorder, single episode, unspecified: F32.9

## 2022-04-19 HISTORY — DX: Anal fistula, unspecified: K60.30

## 2022-04-19 HISTORY — DX: Personal history of other infectious and parasitic diseases: Z86.19

## 2022-04-19 HISTORY — DX: Bipolar II disorder: F31.81

## 2022-04-19 HISTORY — DX: Personal history of other specified conditions: Z87.898

## 2022-04-19 HISTORY — DX: Exercise induced bronchospasm: J45.990

## 2022-04-19 HISTORY — PX: LIGATION OF INTERNAL FISTULA TRACT: SHX6551

## 2022-04-19 LAB — POCT PREGNANCY, URINE: Preg Test, Ur: NEGATIVE

## 2022-04-19 SURGERY — LIGATION, INTERNAL FISTULA TRACT
Anesthesia: General | Site: Rectum

## 2022-04-19 MED ORDER — MIDAZOLAM HCL 2 MG/2ML IJ SOLN
INTRAMUSCULAR | Status: DC | PRN
  Administered 2022-04-19: 2 mg via INTRAVENOUS

## 2022-04-19 MED ORDER — 0.9 % SODIUM CHLORIDE (POUR BTL) OPTIME
TOPICAL | Status: DC | PRN
  Administered 2022-04-19: 1000 mL

## 2022-04-19 MED ORDER — ONDANSETRON HCL 4 MG/2ML IJ SOLN
INTRAMUSCULAR | Status: AC
  Filled 2022-04-19: qty 2

## 2022-04-19 MED ORDER — DEXMEDETOMIDINE (PRECEDEX) IN NS 20 MCG/5ML (4 MCG/ML) IV SYRINGE
PREFILLED_SYRINGE | INTRAVENOUS | Status: DC | PRN
  Administered 2022-04-19 (×2): 8 ug via INTRAVENOUS

## 2022-04-19 MED ORDER — FENTANYL CITRATE (PF) 250 MCG/5ML IJ SOLN
INTRAMUSCULAR | Status: DC | PRN
  Administered 2022-04-19: 25 ug via INTRAVENOUS
  Administered 2022-04-19: 50 ug via INTRAVENOUS
  Administered 2022-04-19 (×2): 25 ug via INTRAVENOUS

## 2022-04-19 MED ORDER — SODIUM CHLORIDE 0.9% FLUSH
3.0000 mL | Freq: Two times a day (BID) | INTRAVENOUS | Status: DC
Start: 2022-04-19 — End: 2022-04-19

## 2022-04-19 MED ORDER — DEXAMETHASONE SODIUM PHOSPHATE 10 MG/ML IJ SOLN
INTRAMUSCULAR | Status: AC
  Filled 2022-04-19: qty 1

## 2022-04-19 MED ORDER — PROPOFOL 10 MG/ML IV BOLUS
INTRAVENOUS | Status: AC
  Filled 2022-04-19: qty 20

## 2022-04-19 MED ORDER — TRAMADOL HCL 50 MG PO TABS: 50.0000 mg | ORAL_TABLET | Freq: Four times a day (QID) | ORAL | 0 refills | Status: DC | PRN

## 2022-04-19 MED ORDER — BUPIVACAINE LIPOSOME 1.3 % IJ SUSP: 20.0000 mL | Freq: Once | INTRAMUSCULAR | Status: DC

## 2022-04-19 MED ORDER — ONDANSETRON HCL 4 MG/2ML IJ SOLN
INTRAMUSCULAR | Status: DC | PRN
  Administered 2022-04-19: 4 mg via INTRAVENOUS

## 2022-04-19 MED ORDER — LIDOCAINE 2% (20 MG/ML) 5 ML SYRINGE
INTRAMUSCULAR | Status: DC | PRN
  Administered 2022-04-19: 60 mg via INTRAVENOUS

## 2022-04-19 MED ORDER — KETOROLAC TROMETHAMINE 30 MG/ML IJ SOLN
INTRAMUSCULAR | Status: DC | PRN
  Administered 2022-04-19: 30 mg via INTRAVENOUS

## 2022-04-19 MED ORDER — SUGAMMADEX SODIUM 200 MG/2ML IV SOLN
INTRAVENOUS | Status: DC | PRN
  Administered 2022-04-19: 200 mg via INTRAVENOUS

## 2022-04-19 MED ORDER — ROCURONIUM BROMIDE 10 MG/ML (PF) SYRINGE
PREFILLED_SYRINGE | INTRAVENOUS | Status: AC
  Filled 2022-04-19: qty 10

## 2022-04-19 MED ORDER — FENTANYL CITRATE (PF) 100 MCG/2ML IJ SOLN
INTRAMUSCULAR | Status: AC
  Filled 2022-04-19: qty 2

## 2022-04-19 MED ORDER — ACETAMINOPHEN 500 MG PO TABS
1000.0000 mg | ORAL_TABLET | ORAL | Status: AC
  Administered 2022-04-19: 1000 mg via ORAL

## 2022-04-19 MED ORDER — BUPIVACAINE-EPINEPHRINE (PF) 0.5% -1:200000 IJ SOLN
INTRAMUSCULAR | Status: DC | PRN
  Administered 2022-04-19: 40 mL

## 2022-04-19 MED ORDER — LIDOCAINE HCL (PF) 2 % IJ SOLN
INTRAMUSCULAR | Status: AC
  Filled 2022-04-19: qty 10

## 2022-04-19 MED ORDER — DEXAMETHASONE SODIUM PHOSPHATE 10 MG/ML IJ SOLN
INTRAMUSCULAR | Status: DC | PRN
  Administered 2022-04-19: 10 mg via INTRAVENOUS

## 2022-04-19 MED ORDER — ROCURONIUM BROMIDE 10 MG/ML (PF) SYRINGE
PREFILLED_SYRINGE | INTRAVENOUS | Status: DC | PRN
  Administered 2022-04-19: 50 mg via INTRAVENOUS

## 2022-04-19 MED ORDER — FENTANYL CITRATE (PF) 100 MCG/2ML IJ SOLN: 25.0000 ug | INTRAMUSCULAR | Status: DC | PRN

## 2022-04-19 MED ORDER — PROPOFOL 10 MG/ML IV BOLUS
INTRAVENOUS | Status: DC | PRN
  Administered 2022-04-19: 200 mg via INTRAVENOUS

## 2022-04-19 MED ORDER — PROMETHAZINE HCL 25 MG/ML IJ SOLN: 6.2500 mg | INTRAMUSCULAR | Status: DC | PRN

## 2022-04-19 MED ORDER — LACTATED RINGERS IV SOLN: INTRAVENOUS | Status: DC

## 2022-04-19 MED ORDER — MIDAZOLAM HCL 2 MG/2ML IJ SOLN
INTRAMUSCULAR | Status: AC
  Filled 2022-04-19: qty 2

## 2022-04-19 SURGICAL SUPPLY — 54 items
APL SKNCLS STERI-STRIP NONHPOA (GAUZE/BANDAGES/DRESSINGS) ×2
BENZOIN TINCTURE PRP APPL 2/3 (GAUZE/BANDAGES/DRESSINGS) ×2 IMPLANT
BLADE EXTENDED COATED 6.5IN (ELECTRODE) IMPLANT
BLADE SURG 10 STRL SS (BLADE) ×1 IMPLANT
COVER BACK TABLE 60X90IN (DRAPES) ×1 IMPLANT
COVER MAYO STAND STRL (DRAPES) ×1 IMPLANT
DRAPE LAPAROTOMY 100X72 PEDS (DRAPES) ×1 IMPLANT
DRAPE UTILITY XL STRL (DRAPES) ×1 IMPLANT
DRSG PAD ABDOMINAL 8X10 ST (GAUZE/BANDAGES/DRESSINGS) ×1 IMPLANT
ELECT REM PT RETURN 9FT ADLT (ELECTROSURGICAL) ×1
ELECTRODE REM PT RTRN 9FT ADLT (ELECTROSURGICAL) ×1 IMPLANT
GAUZE 4X4 16PLY ~~LOC~~+RFID DBL (SPONGE) ×1 IMPLANT
GAUZE SPONGE 4X4 12PLY STRL (GAUZE/BANDAGES/DRESSINGS) ×1 IMPLANT
GLOVE BIO SURGEON STRL SZ 6.5 (GLOVE) ×1 IMPLANT
GLOVE BIOGEL PI IND STRL 7.0 (GLOVE) ×1 IMPLANT
GLOVE BIOGEL PI INDICATOR 7.0 (GLOVE) ×1
GLOVE INDICATOR 6.5 STRL GRN (GLOVE) ×1 IMPLANT
GOWN STRL REUS W/TWL XL LVL3 (GOWN DISPOSABLE) ×1 IMPLANT
HYDROGEN PEROXIDE 16OZ (MISCELLANEOUS) ×1 IMPLANT
IV CATH 14GX2 1/4 (CATHETERS) ×1 IMPLANT
IV CATH 18G SAFETY (IV SOLUTION) ×1 IMPLANT
KIT SIGMOIDOSCOPE (SET/KITS/TRAYS/PACK) IMPLANT
KIT TURNOVER CYSTO (KITS) ×1 IMPLANT
NEEDLE HYPO 22GX1.5 SAFETY (NEEDLE) ×1 IMPLANT
NS IRRIG 500ML POUR BTL (IV SOLUTION) ×1 IMPLANT
PACK BASIN DAY SURGERY FS (CUSTOM PROCEDURE TRAY) ×1 IMPLANT
PAD ARMBOARD 7.5X6 YLW CONV (MISCELLANEOUS) IMPLANT
PANTS MESH DISP LRG (UNDERPADS AND DIAPERS) ×1 IMPLANT
PENCIL SMOKE EVACUATOR (MISCELLANEOUS) ×1 IMPLANT
RETRACTOR STAY HOOK 5MM (MISCELLANEOUS) IMPLANT
RETRACTOR STERILE 25.8CMX11.3 (INSTRUMENTS) IMPLANT
SPONGE SURGIFOAM ABS GEL 12-7 (HEMOSTASIS) IMPLANT
SUCTION FRAZIER HANDLE 10FR (MISCELLANEOUS) ×1
SUCTION TUBE FRAZIER 10FR DISP (MISCELLANEOUS) ×1 IMPLANT
SUT CHROMIC 2 0 SH (SUTURE) IMPLANT
SUT CHROMIC 3 0 SH 27 (SUTURE) IMPLANT
SUT SILK 2 0 (SUTURE) ×1
SUT SILK 2 0 SH CR/8 (SUTURE) IMPLANT
SUT SILK 2-0 18XBRD TIE 12 (SUTURE) ×1 IMPLANT
SUT SILK 3 0 SH 30 (SUTURE) IMPLANT
SUT SILK 3 0 SH CR/8 (SUTURE) IMPLANT
SUT VIC AB 2-0 SH 27 (SUTURE)
SUT VIC AB 2-0 SH 27XBRD (SUTURE) IMPLANT
SUT VIC AB 3-0 SH 27 (SUTURE)
SUT VIC AB 3-0 SH 27X BRD (SUTURE) IMPLANT
SUT VIC AB 3-0 SH 8-18 (SUTURE) ×1 IMPLANT
SUT VICRYL 3 0 UR 6 27 (SUTURE) IMPLANT
SYR 10ML LL (SYRINGE) ×1 IMPLANT
SYR BULB IRRIG 60ML STRL (SYRINGE) ×1 IMPLANT
SYR CONTROL 10ML LL (SYRINGE) ×1 IMPLANT
TOWEL OR 17X26 10 PK STRL BLUE (TOWEL DISPOSABLE) ×1 IMPLANT
TRAY DSU PREP LF (CUSTOM PROCEDURE TRAY) ×1 IMPLANT
TUBE CONNECTING 12X1/4 (SUCTIONS) ×1 IMPLANT
YANKAUER SUCT BULB TIP NO VENT (SUCTIONS) ×1 IMPLANT

## 2022-04-19 NOTE — Anesthesia Postprocedure Evaluation (Signed)
Anesthesia Post Note  Patient: Chelsea Hoover  Procedure(s) Performed: LIGATION OF INTERNAL FISTULA TRACT (Rectum)     Patient location during evaluation: PACU Anesthesia Type: General Level of consciousness: awake and alert Pain management: pain level controlled Vital Signs Assessment: post-procedure vital signs reviewed and stable Respiratory status: spontaneous breathing, nonlabored ventilation and respiratory function stable Cardiovascular status: stable and blood pressure returned to baseline Anesthetic complications: no   No notable events documented.  Last Vitals:  Vitals:   04/19/22 1030 04/19/22 1045  BP: 117/76 117/78  Pulse: 85 85  Resp: (!) 25 15  Temp: (!) 36.4 C   SpO2: 95% 100%    Last Pain:  Vitals:   04/19/22 1030  TempSrc:   PainSc: Asleep                 Beryle Lathe

## 2022-04-19 NOTE — Transfer of Care (Signed)
Immediate Anesthesia Transfer of Care Note  Patient: Chelsea Hoover  Procedure(s) Performed: LIGATION OF INTERNAL FISTULA TRACT (Rectum)  Patient Location: PACU  Anesthesia Type:General  Level of Consciousness: awake, alert  and oriented  Airway & Oxygen Therapy: Patient Spontanous Breathing  Post-op Assessment: Report given to RN and Post -op Vital signs reviewed and stable  Post vital signs: Reviewed and stable  Last Vitals:  Vitals Value Taken Time  BP 144/85 04/19/22 1003  Temp    Pulse    Resp 28 04/19/22 1005  SpO2    Vitals shown include unvalidated device data.  Last Pain:  Vitals:   04/19/22 0635  TempSrc: Oral  PainSc: 0-No pain      Patients Stated Pain Goal: 4 (04/19/22 9604)  Complications: No notable events documented.

## 2022-04-19 NOTE — Discharge Instructions (Addendum)
ANORECTAL SURGERY: POST OP INSTRUCTIONS Take your usually prescribed home medications unless otherwise directed. DIET: During the first few hours after surgery sip on some liquids until you are able to urinate.  It is normal to not urinate for several hours after this surgery.  If you feel uncomfortable, please contact the office for instructions.  After you are able to urinate,you may eat, if you feel like it.  Follow a light bland diet the first 24 hours after arrival home, such as soup, liquids, crackers, etc.  Be sure to include lots of fluids daily (6-8 glasses).  Avoid fast food or heavy meals, as your are more likely to get nauseated.  Eat a low fat diet the next few days after surgery.  Limit caffeine intake to 1-2 servings a day. PAIN CONTROL: Pain is best controlled by a usual combination of several different methods TOGETHER: Muscle relaxation: Soak in a warm bath (or Sitz bath) three times a day and after bowel movements.  Continue to do this until all pain is resolved. Over the counter pain medication Prescription pain medication Most patients will experience some swelling and discomfort in the anus/rectal area and incisions.  Heat such as warm towels, sitz baths, warm baths, etc to help relax tight/sore spots and speed recovery.  Some people prefer to use ice, especially in the first couple days after surgery, as it may decrease the pain and swelling, or alternate between ice & heat.  Experiment to what works for you.  Swelling and bruising can take several weeks to resolve.  Pain can take even longer to completely resolve. It is helpful to take an over-the-counter pain medication regularly for the first few weeks.  Choose one of the following that works best for you: Naproxen (Aleve, etc)  Two 220mg tabs twice a day Ibuprofen (Advil, etc) Three 200mg tabs four times a day (every meal & bedtime) A  prescription for pain medication (such as percocet, oxycodone, hydrocodone, etc) should be  given to you upon discharge.  Take your pain medication as prescribed.  If you are having problems/concerns with the prescription medicine (does not control pain, nausea, vomiting, rash, itching, etc), please call us [phone] to see if we need to switch you to a different pain medicine that will work better for you and/or control your side effect better. If you need a refill on your pain medication, please contact your pharmacy.  They will contact our office to request authorization. Prescriptions will not be filled after 5 pm or on week-ends. KEEP YOUR BOWELS REGULAR and AVOID CONSTIPATION The goal is one to two soft bowel movements a day.  You should at least have a bowel movement every other day. Avoid getting constipated.  Between the surgery and the pain medications, it is common to experience some constipation. This can be very painful after rectal surgery.  Increasing fluid intake and taking a fiber supplement (such as Metamucil, Citrucel, FiberCon, etc) 1-2 times a day regularly will usually help prevent this problem from occurring.  A stool softener like colace is also recommended.  This can be purchased over the counter at your pharmacy.  You can take it up to 3 times a day.  If you do not have a bowel movement after 24 hrs since your surgery, take one does of milk of magnesia.  If you still haven't had a bowel movement 8-12 hours after that dose, take another dose.  If you don't have a bowel movement 48 hrs after surgery,   purchase a Fleets enema from the drug store and administer gently per package instructions.  If you still are having trouble with your bowel movements after that, please call the office for further instructions. If you develop diarrhea or have many loose bowel movements, simplify your diet to bland foods & liquids for a few days.  Stop any stool softeners and decrease your fiber supplement.  Switching to mild anti-diarrheal medications (Kayopectate, Pepto Bismol) can help.   If this worsens or does not improve, please call us.  Wound Care Remove your bandages before your first bowel movement or 8 hours after surgery.     Remove any wound packing material at this tim,e as well.  You do not need to repack the wound unless instructed otherwise.  Wear an absorbent pad or soft cotton gauze in your underwear to catch any drainage and help keep the area clean. You should change this every 2-3 hours while awake. Keep the area clean and dry.  Bathe / shower every day, especially after bowel movements.  Keep the area clean by showering / bathing over the incision / wound.   It is okay to soak an open wound to help wash it.  Wet wipes or showers / gentle washing after bowel movements is often less traumatic than regular toilet paper. You may have some styrofoam-like soft packing in the rectum which will come out with the first bowel movement.  You will often notice bleeding with bowel movements.  This should slow down by the end of the first week of surgery Expect some drainage.  This should slow down, too, by the end of the first week of surgery.  Wear an absorbent pad or soft cotton gauze in your underwear until the drainage stops. Do Not sit on a rubber or pillow ring.  This can make you symptoms worse.  You may sit on a soft pillow if needed.  ACTIVITIES as tolerated:   You may resume regular (light) daily activities beginning the next day--such as daily self-care, walking, climbing stairs--gradually increasing activities as tolerated.  If you can walk 30 minutes without difficulty, it is safe to try more intense activity such as jogging, treadmill, bicycling, low-impact aerobics, swimming, etc. Save the most intensive and strenuous activity for last such as sit-ups, heavy lifting, contact sports, etc  Refrain from any heavy lifting or straining until you are off narcotics for pain control.   You may drive when you are no longer taking prescription pain medication, you can  comfortably sit for long periods of time, and you can safely maneuver your car and apply brakes. You may have sexual intercourse when it is comfortable.  FOLLOW UP in our office Please call [facility] at [phone] to set up an appointment to see your surgeon in the office for a follow-up appointment approximately 3-4 weeks after your surgery. Make sure that you call for this appointment the day you arrive home to insure a convenient appointment time. 10. IF YOU HAVE DISABILITY OR FAMILY LEAVE FORMS, BRING THEM TO THE OFFICE FOR PROCESSING.  DO NOT GIVE THEM TO YOUR DOCTOR.     WHEN TO CALL us [phone]: Poor pain control Reactions / problems with new medications (rash/itching, nausea, etc)  Fever over 101.5 F (38.5 C) Inability to urinate Nausea and/or vomiting Worsening swelling or bruising Continued bleeding from incision. Increased pain, redness, or drainage from the incision  The clinic staff is available to answer your questions during regular business hours (8:30am-5pm).  Please don't hesitate to call and ask to speak to one of our nurses for clinical concerns.   A surgeon from [facility] is always on call at the hospitals   If you have a medical emergency, go to the nearest emergency room or call 911.    [facility] [address], [place], [place]  [address] ? MAIN: [phone] ? TOLL FREE: [phone] ? FAX [phone] [email or web address]         No ibuprofen, Advil, Aleve, Motrin, ketorolac, meloxicam, naproxen, or other NSAIDS until after 3:45 pm today if needed.   No acetaminophen/Tylenol until after 12.;30 pm today if needed.     Post Anesthesia Home Care Instructions  Activity: Get plenty of rest for the remainder of the day. A responsible individual must stay with you for 24 hours following the procedure.  For the next 24 hours, DO NOT: -Drive a car -Advertising copywriter -Drink alcoholic  beverages -Take any medication unless instructed by your physician -Make any legal decisions or sign important papers.  Meals: Start with liquid foods such as gelatin or soup. Progress to regular foods as tolerated. Avoid greasy, spicy, heavy foods. If nausea and/or vomiting occur, drink only clear liquids until the nausea and/or vomiting subsides. Call your physician if vomiting continues.  Special Instructions/Symptoms: Your throat may feel dry or sore from the anesthesia or the breathing tube placed in your throat during surgery. If this causes discomfort, gargle with warm salt water. The discomfort should disappear within 24 hours.     Information for Discharge Teaching: EXPAREL (bupivacaine liposome injectable suspension)   Your surgeon or anesthesiologist gave you EXPAREL(bupivacaine) to help control your pain after surgery.  EXPAREL is a local anesthetic that provides pain relief by numbing the tissue around the surgical site. EXPAREL is designed to release pain medication over time and can control pain for up to 72 hours. Depending on how you respond to EXPAREL, you may require less pain medication during your recovery.  Possible side effects: Temporary loss of sensation or ability to move in the area where bupivacaine was injected. Nausea, vomiting, constipation Rarely, numbness and tingling in your mouth or lips, lightheadedness, or anxiety may occur. Call your doctor right away if you think you may be experiencing any of these sensations, or if you have other questions regarding possible side effects.  Follow all other discharge instructions given to you by your surgeon or nurse. Eat a healthy diet and drink plenty of water or other fluids.  If you return to the hospital for any reason within 96 hours following the administration of EXPAREL, it is important for health care providers to know that you have received this anesthetic. A teal colored band has been placed on your arm  with the date, time and amount of EXPAREL you have received in order to alert and inform your health care providers. Please leave this armband in place for the full 96 hours following administration, and then you may remove the band.

## 2022-04-19 NOTE — H&P (Signed)
PROVIDER:  Monico Blitz, MD   MRN: [id number] DOB: December 30, 1995 DATE OF ENCOUNTER: 02/12/2022 Interval History:    Chelsea Hoover is a 25 y.o. female who underwent I&D of perirectal abscess in August 2022.  Since that time, she has had continued drainage from that area.  She subsequently underwent a laparoscopic cholecystectomy by Dr. Bobbye Morton and mentioned to her that she was still having drainage in this area.  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  She underwent an exam under anesthesia with seton placement on December 12, 2021.  She is here today for follow-up.  She is having minimal drainage and pain at this time.     Past Medical History:  Diagnosis Date   Anxiety     Anxiety disorder of adolescence 09/01/2015   Asthma      pt states this is exercise-induced asthma, never really uses inhaler   Bipolar 1 disorder ([place])     Cannabis abuse 09/03/2015   Depression     GERD (gastroesophageal reflux disease)     Irritable bowel syndrome     Lyme disease     Mood disorder ([place])     Palpitations      Per pt long time ago   PTSD (post-traumatic stress disorder) 09/01/2015         Past Surgical History:  Procedure Laterality Date   CHOLECYSTECTOMY N/A 10/12/2021    Procedure: LAPAROSCOPIC CHOLECYSTECTOMY;  Surgeon: Jesusita Oka, MD;  Location: [facility];  Service: General;  Laterality: N/A;   INCISION AND DRAINAGE PERIRECTAL ABSCESS N/A 04/29/2021    Procedure: IRRIGATION AND DEBRIDEMENT PERIRECTAL ABSCESS;  Surgeon: Georganna Skeans, MD;  Location: [facility];  Service: General;  Laterality: N/A;   [place] N/A 12/13/2021    Procedure: PLACEMENT OF SETON;  Surgeon: Leighton Ruff, MD;  Location: [facility];  Service: General;  Laterality: N/A;   RECTAL EXAM UNDER ANESTHESIA N/A 12/13/2021    Procedure: ANORECTAL EXAM UNDER ANESTHESIA;  Surgeon: Leighton Ruff, MD;  Location: [facility];  Service: General;  Laterality:  N/A;   TONSILECTOMY/ADENOIDECTOMY WITH MYRINGOTOMY Bilateral      August 2020   WISDOM TOOTH EXTRACTION   2018    Social History         Socioeconomic History   Marital status: Single      Spouse name: Not on file   Number of children: 0   Years of education: Not on file   Highest education level: Not on file  Occupational History   Not on file  Tobacco Use   Smoking status: Never   Smokeless tobacco: Never  Vaping Use   Vaping Use: Never used  Substance and Sexual Activity   Alcohol use: Not Currently   Drug use: Not Currently      Types: Marijuana      Comment: maybe every other week per pt, last use ~ 12/10/21 per pt   Sexual activity: Yes      Birth control/protection: Pill  Other Topics Concern   Not on file  Social History Narrative    Left handed    Lives with mother and sisters    Two story    Caffeine 1 or 2 daily    Social Determinants of Health    Financial Resource Strain: Not on file  Food Insecurity: Not on file  Transportation Needs: Not on file  Physical Activity: Not on file  Stress: Not on file  Social Connections: Not on file  Intimate Partner Violence: Not on file         Family History  Problem Relation Age of Onset   Crohn's disease Father     Bipolar disorder Maternal Aunt     Schizophrenia Maternal Grandfather     Bipolar disorder Maternal Grandfather        Current Outpatient Medications:    albuterol (VENTOLIN HFA) 108 (90 Base) MCG/ACT inhaler, Inhale 1 puff into the lungs as needed for shortness of breath., Disp: , Rfl:    buPROPion (WELLBUTRIN) 100 MG tablet, Take 150 mg by mouth every morning., Disp: , Rfl:    calcium-vitamin D (OSCAL WITH D) 500-5 MG-MCG tablet, Take 1 tablet by mouth. (Patient not taking: Reported on 01/15/2022), Disp: , Rfl:    clobetasol cream (TEMOVATE) 3.70 %, Apply 1 application. topically 2 (two) times daily., Disp: , Rfl:    famotidine (PEPCID) 40 MG tablet, Take 40 mg by mouth at bedtime., Disp: , Rfl:     hydrOXYzine (ATARAX/VISTARIL) 25 MG tablet, Take 25 mg by mouth daily as needed for anxiety., Disp: , Rfl:    ibuprofen (ADVIL) 600 MG tablet, Take 1 tablet (600 mg total) by mouth every 6 (six) hours as needed., Disp: 120 tablet, Rfl: 1   lamoTRIgine (LAMICTAL) 100 MG tablet, Take 1 tablet (100 mg total) by mouth 2 (two) times daily., Disp: 60 tablet, Rfl: 0   methocarbamol (ROBAXIN) 500 MG tablet, Take 500 mg by mouth. 2 times daily as needed, Disp: , Rfl:    Multiple Vitamin (MULTIVITAMIN WITH MINERALS) TABS tablet, Take 1 tablet by mouth daily. (Patient not taking: Reported on 01/15/2022), Disp: , Rfl:    sucralfate (CARAFATE) 1 g tablet, Take 1 g by mouth 4 (four) times daily -  with meals and at bedtime., Disp: , Rfl:    tinidazole (TINDAMAX) 500 MG tablet, Take 500 mg by mouth daily with breakfast. 2 x daily, Disp: , Rfl:    traMADol (ULTRAM) 50 MG tablet, Take 1 tablet (50 mg total) by mouth every 6 (six) hours as needed. (Patient not taking: Reported on 01/15/2022), Disp: 15 tablet, Rfl: 1   traZODone (DESYREL) 150 MG tablet, Take 0.5 tablets (75 mg total) by mouth at bedtime., Disp: 15 tablet, Rfl: 0      Allergies  Allergen Reactions   Oxycodone Other (See Comments)      Intense vomiting   Lactose Intolerance (Gi) Other (See Comments)      Pain in stomach   Latex Rash   Review of Systems - Negative except as stated above   Physical Examination:    Gen: NAD Abd: soft CV: RRR Lungs: CTA Incision: seton in place.   Assessment and Plan:    Chelsea Hoover is a 26 y.o. female who underwent seton placement in mid April 2023.   There are no diagnoses linked to this encounter.     We have discussed the lift procedure as well as fistulotomy and MAF.  I recommended proceeding with LIFT and then MAF if this did not work.  Success rate is 80%.  Other risks include bleeding, infection and pain. All questions answered.   No follow-ups on file.     The plan was discussed in detail  with the patient today, who expressed understanding.  The patient has my contact information, and understands to call me with any additional questions or concerns in the interval.  I would be happy to see the patient back  sooner if the need arises.    Rosario Adie, MD Colon and Rectal Surgery [facility]

## 2022-04-19 NOTE — Op Note (Addendum)
04/19/2022  9:49 AM  PATIENT:  Chelsea Hoover  26 y.o. female  Patient Care Team: Eliezer Lofts, MD as PCP - General (General Practice)  PRE-OPERATIVE DIAGNOSIS:  anal fistula  POST-OPERATIVE DIAGNOSIS:  anal fistula  PROCEDURE:  LIGATION OF INTERNAL FISTULA TRACT   Surgeon(s): Romie Levee, MD  ASSISTANT: Cristi Loron, MD   ANESTHESIA:   local and general  SPECIMEN:  No Specimen  DISPOSITION OF SPECIMEN:  PATHOLOGY  COUNTS:  YES  PLAN OF CARE: Discharge to home after PACU  PATIENT DISPOSITION:  PACU - hemodynamically stable.  INDICATION: 26 y.o. F with trans-sphincteric anterior anal fistula s/p seton placement   OR FINDINGS: seton in place, anterior anal fistula  DESCRIPTION: the patient was identified in the preoperative holding area and taken to the OR where they were laid on the operating room table.  General anesthesia was induced without difficulty. The patient was then positioned in prone jackknife position with buttocks gently taped apart.  The patient was then prepped and draped in usual sterile fashion.  SCDs were noted to be in place prior to the initiation of anesthesia. A surgical timeout was performed indicating the correct patient, procedure, positioning and need for preoperative antibiotics.  A rectal block was performed using Marcaine with epinephrine mixed with Experel.    I began with a digital rectal exam.  The anal canal was dilated to approximately 2 fingerbreadths.  I then placed a Hill-Ferguson anoscope into the anal canal and evaluated this completely.  The anterior opening could be noted.  An S shaped fistula probe was placed into the fistula tract and the seton was removed.  An incision was made over the intersphincteric groove.  Dissection was carried down between the sphincter complexes on either side.  I then carefully dissected posterior to the fistula tract using a right angle dissector.  Once I was able to get all the way underneath the  fistula tract 2-0 silk sutures were pulled through and tied to the internal portion of the fistula tract.  An incision was made through the fistula tract distal to these 2 sutures.  The internal portion of the external fistula tract was closed with a 2-0 silk suture as well in a figure-of-eight fashion.  We then injected hydrogen peroxide in both fistula tracts and closed any leaks that were noted on each side using 2-0 Vicryl figure-of-eight sutures.  Once this was complete the fistula tracts were irrigated with normal saline.  2-0 Vicryl sutures were used to close the intersphincteric groove and a 2-0 chromic suture was used to close the incision.  An additional 2-0 chromic suture was used to close the mucosa internally at the fistula opening.  The external fistula tract was opened to allow for drainage.  The area was then infused with local anesthesia.  A sterile dressing was applied.  The patient was awakened from anesthesia and sent to the postanesthesia care unit in stable condition.  All counts were correct per operating room staff.  I was personally present during the key and critical portions of this procedure and immediately available throughout the entire procedure, as documented in my operative note.   [name], MD  Colorectal and General Surgery [facility]

## 2022-04-19 NOTE — Anesthesia Procedure Notes (Signed)
Procedure Name: Intubation Date/Time: 04/19/2022 8:49 AM  Performed by: Clearnce Sorrel, CRNAPre-anesthesia Checklist: Patient identified, Emergency Drugs available, Suction available and Patient being monitored Patient Re-evaluated:Patient Re-evaluated prior to induction Oxygen Delivery Method: Circle System Utilized Preoxygenation: Pre-oxygenation with 100% oxygen Induction Type: IV induction Ventilation: Mask ventilation without difficulty Laryngoscope Size: Mac and 3 Grade View: Grade I Tube type: Oral Tube size: 7.0 mm Number of attempts: 1 Airway Equipment and Method: Stylet and Oral airway Placement Confirmation: ETT inserted through vocal cords under direct vision, positive ETCO2 and breath sounds checked- equal and bilateral Secured at: 21 cm Tube secured with: Tape Dental Injury: Teeth and Oropharynx as per pre-operative assessment

## 2022-04-19 NOTE — Anesthesia Preprocedure Evaluation (Addendum)
Anesthesia Evaluation  Patient identified by MRN, date of birth, ID band Patient awake    Reviewed: Allergy & Precautions, NPO status , Patient's Chart, lab work & pertinent test results  History of Anesthesia Complications Negative for: history of anesthetic complications  Airway Mallampati: III  TM Distance: >3 FB Neck ROM: Full    Dental  (+) Dental Advisory Given   Pulmonary asthma , sleep apnea , Current Smoker and Patient abstained from smoking.,    Pulmonary exam normal        Cardiovascular negative cardio ROS Normal cardiovascular exam     Neuro/Psych PSYCHIATRIC DISORDERS Anxiety Depression Bipolar Disorder  Mood d/o negative neurological ROS     GI/Hepatic GERD  Medicated and Controlled,(+)     substance abuse  marijuana use,  Crohn's    Endo/Other  Morbid obesity  Renal/GU negative Renal ROS     Musculoskeletal negative musculoskeletal ROS (+)   Abdominal   Peds  Hematology negative hematology ROS (+)   Anesthesia Other Findings   Reproductive/Obstetrics                            Anesthesia Physical Anesthesia Plan  ASA: 3  Anesthesia Plan: General   Post-op Pain Management: Tylenol PO (pre-op)*   Induction: Intravenous  PONV Risk Score and Plan: 2 and Treatment may vary due to age or medical condition, Ondansetron, Dexamethasone and Midazolam  Airway Management Planned: Oral ETT  Additional Equipment: None  Intra-op Plan:   Post-operative Plan: Extubation in OR  Informed Consent: I have reviewed the patients History and Physical, chart, labs and discussed the procedure including the risks, benefits and alternatives for the proposed anesthesia with the patient or authorized representative who has indicated his/her understanding and acceptance.     Dental advisory given  Plan Discussed with: CRNA, Anesthesiologist and Surgeon  Anesthesia Plan  Comments:       Anesthesia Quick Evaluation

## 2022-04-22 ENCOUNTER — Encounter (HOSPITAL_BASED_OUTPATIENT_CLINIC_OR_DEPARTMENT_OTHER): Payer: Self-pay | Admitting: General Surgery

## 2022-05-05 ENCOUNTER — Other Ambulatory Visit: Payer: Self-pay

## 2022-05-05 ENCOUNTER — Emergency Department (HOSPITAL_COMMUNITY): Payer: Commercial Managed Care - HMO

## 2022-05-05 ENCOUNTER — Encounter (HOSPITAL_COMMUNITY): Payer: Self-pay | Admitting: Emergency Medicine

## 2022-05-05 ENCOUNTER — Observation Stay (HOSPITAL_COMMUNITY)
Admission: EM | Admit: 2022-05-05 | Discharge: 2022-05-06 | Disposition: A | Discharge status: Home / Self Care | Payer: Commercial Managed Care - HMO | Attending: General Surgery | Admitting: General Surgery

## 2022-05-05 DIAGNOSIS — T8141XA Infection following a procedure, superficial incisional surgical site, initial encounter: Secondary | ICD-10-CM | POA: Diagnosis not present

## 2022-05-05 DIAGNOSIS — Z79899 Other long term (current) drug therapy: Secondary | ICD-10-CM | POA: Insufficient documentation

## 2022-05-05 DIAGNOSIS — Y834 Other reconstructive surgery as the cause of abnormal reaction of the patient, or of later complication, without mention of misadventure at the time of the procedure: Secondary | ICD-10-CM | POA: Diagnosis not present

## 2022-05-05 DIAGNOSIS — Z91040 Latex allergy status: Secondary | ICD-10-CM | POA: Insufficient documentation

## 2022-05-05 DIAGNOSIS — K610 Anal abscess: Secondary | ICD-10-CM | POA: Diagnosis not present

## 2022-05-05 DIAGNOSIS — J45909 Unspecified asthma, uncomplicated: Secondary | ICD-10-CM | POA: Insufficient documentation

## 2022-05-05 LAB — CBC WITH DIFFERENTIAL/PLATELET
Abs Immature Granulocytes: 0.03 10*3/uL (ref 0.00–0.07)
Basophils Absolute: 0.1 10*3/uL (ref 0.0–0.1)
Basophils Relative: 1 %
Eosinophils Absolute: 0.3 10*3/uL (ref 0.0–0.5)
Eosinophils Relative: 2 %
HCT: 38.4 % (ref 36.0–46.0)
Hemoglobin: 12.7 g/dL (ref 12.0–15.0)
Immature Granulocytes: 0 %
Lymphocytes Relative: 31 %
Lymphs Abs: 3.9 10*3/uL (ref 0.7–4.0)
MCH: 28.5 pg (ref 26.0–34.0)
MCHC: 33.1 g/dL (ref 30.0–36.0)
MCV: 86.1 fL (ref 80.0–100.0)
Monocytes Absolute: 0.7 10*3/uL (ref 0.1–1.0)
Monocytes Relative: 6 %
Neutro Abs: 7.4 10*3/uL (ref 1.7–7.7)
Neutrophils Relative %: 60 %
Platelets: 370 10*3/uL (ref 150–400)
RBC: 4.46 MIL/uL (ref 3.87–5.11)
RDW: 12.9 % (ref 11.5–15.5)
WBC: 12.4 10*3/uL — ABNORMAL HIGH (ref 4.0–10.5)
nRBC: 0 % (ref 0.0–0.2)

## 2022-05-05 LAB — URINALYSIS, ROUTINE W REFLEX MICROSCOPIC
Bilirubin Urine: NEGATIVE
Glucose, UA: NEGATIVE mg/dL
Hgb urine dipstick: NEGATIVE
Ketones, ur: NEGATIVE mg/dL
Nitrite: NEGATIVE
Protein, ur: NEGATIVE mg/dL
Specific Gravity, Urine: 1.027 (ref 1.005–1.030)
pH: 6 (ref 5.0–8.0)

## 2022-05-05 LAB — COMPREHENSIVE METABOLIC PANEL
ALT: 36 U/L (ref 0–44)
AST: 28 U/L (ref 15–41)
Albumin: 4 g/dL (ref 3.5–5.0)
Alkaline Phosphatase: 75 U/L (ref 38–126)
Anion gap: 7 (ref 5–15)
BUN: 14 mg/dL (ref 6–20)
CO2: 26 mmol/L (ref 22–32)
Calcium: 9.3 mg/dL (ref 8.9–10.3)
Chloride: 105 mmol/L (ref 98–111)
Creatinine, Ser: 0.93 mg/dL (ref 0.44–1.00)
GFR, Estimated: >60 mL/min (ref >60)
Glucose, Bld: 97 mg/dL (ref 70–99)
Potassium: 3.7 mmol/L (ref 3.5–5.1)
Sodium: 138 mmol/L (ref 135–145)
Total Bilirubin: 0.4 mg/dL (ref 0.3–1.2)
Total Protein: 6.9 g/dL (ref 6.5–8.1)

## 2022-05-05 LAB — I-STAT BETA HCG BLOOD, ED (MC, WL, AP ONLY): I-stat hCG, quantitative: <5 m[IU]/mL (ref <5)

## 2022-05-05 LAB — LACTIC ACID, PLASMA: Lactic Acid, Venous: 1 mmol/L (ref 0.5–1.9)

## 2022-05-05 MED ORDER — TRAMADOL HCL 50 MG PO TABS: 50.0000 mg | ORAL_TABLET | Freq: Four times a day (QID) | ORAL | Status: DC | PRN

## 2022-05-05 MED ORDER — IOHEXOL 300 MG/ML  SOLN
100.0000 mL | Freq: Once | INTRAMUSCULAR | Status: AC | PRN
Start: 2022-05-05 — End: 2022-05-05
  Administered 2022-05-05: 100 mL via INTRAVENOUS

## 2022-05-05 MED ORDER — TRAZODONE HCL 50 MG PO TABS
75.0000 mg | ORAL_TABLET | Freq: Every day | ORAL | Status: DC
  Administered 2022-05-06: 75 mg via ORAL
  Filled 2022-05-05: qty 2

## 2022-05-05 MED ORDER — PIPERACILLIN-TAZOBACTAM 3.375 G IVPB
3.3750 g | Freq: Three times a day (TID) | INTRAVENOUS | Status: DC
  Administered 2022-05-06 (×2): 3.375 g via INTRAVENOUS
  Filled 2022-05-05 (×2): qty 50

## 2022-05-05 MED ORDER — LAMOTRIGINE 100 MG PO TABS
100.0000 mg | ORAL_TABLET | Freq: Two times a day (BID) | ORAL | Status: DC
  Administered 2022-05-06 (×2): 100 mg via ORAL
  Filled 2022-05-05 (×2): qty 1
  Filled 2022-05-05: qty 4

## 2022-05-05 MED ORDER — ACETAMINOPHEN 325 MG PO TABS: 650.0000 mg | ORAL_TABLET | ORAL | Status: DC | PRN

## 2022-05-05 MED ORDER — FAMOTIDINE 20 MG PO TABS
40.0000 mg | ORAL_TABLET | Freq: Every day | ORAL | Status: DC
  Administered 2022-05-06: 40 mg via ORAL
  Filled 2022-05-05: qty 2

## 2022-05-05 MED ORDER — MORPHINE SULFATE (PF) 4 MG/ML IV SOLN: 4.0000 mg | INTRAVENOUS | Status: DC | PRN

## 2022-05-05 MED ORDER — DIPHENHYDRAMINE HCL 25 MG PO CAPS: 25.0000 mg | ORAL_CAPSULE | Freq: Four times a day (QID) | ORAL | Status: DC | PRN

## 2022-05-05 MED ORDER — DIPHENHYDRAMINE HCL 50 MG/ML IJ SOLN: 25.0000 mg | Freq: Four times a day (QID) | INTRAMUSCULAR | Status: DC | PRN

## 2022-05-05 MED ORDER — HYDROXYZINE HCL 25 MG PO TABS: 25.0000 mg | ORAL_TABLET | Freq: Every day | ORAL | Status: DC | PRN

## 2022-05-05 MED ORDER — BUPROPION HCL ER (XL) 150 MG PO TB24
150.0000 mg | ORAL_TABLET | Freq: Every day | ORAL | Status: DC
  Filled 2022-05-05: qty 1

## 2022-05-05 MED ORDER — LACTATED RINGERS IV SOLN: INTRAVENOUS | Status: DC

## 2022-05-05 MED ORDER — ONDANSETRON HCL 4 MG/2ML IJ SOLN
4.0000 mg | Freq: Once | INTRAMUSCULAR | Status: AC
  Administered 2022-05-05: 4 mg via INTRAVENOUS
  Filled 2022-05-05: qty 2

## 2022-05-05 MED ORDER — MORPHINE SULFATE (PF) 4 MG/ML IV SOLN
4.0000 mg | Freq: Once | INTRAVENOUS | Status: AC
  Administered 2022-05-05: 4 mg via INTRAVENOUS
  Filled 2022-05-05: qty 1

## 2022-05-05 MED ORDER — ENOXAPARIN SODIUM 40 MG/0.4ML IJ SOSY
40.0000 mg | PREFILLED_SYRINGE | INTRAMUSCULAR | Status: DC
  Administered 2022-05-06: 40 mg via SUBCUTANEOUS
  Filled 2022-05-05: qty 0.4

## 2022-05-05 NOTE — ED Notes (Signed)
Pt ambulatory to room.

## 2022-05-05 NOTE — ED Triage Notes (Signed)
Patient arrives ambulatory by POV with mother. States she had anal fistula surgery at [facility] on 8/18. Starting today having pain and discharge.

## 2022-05-05 NOTE — ED Notes (Signed)
Patient returned to room from CT. 

## 2022-05-05 NOTE — ED Notes (Signed)
This RN chaperoned with ED provider Amjad, PA-C in patient exam.

## 2022-05-05 NOTE — ED Provider Notes (Signed)
[facility] EMERGENCY DEPARTMENT Provider Note   CSN: [id number] Arrival date & time: 05/05/22  1704     History  Chief Complaint  Patient presents with   Post-op Problem    Chelsea Hoover is a 26 y.o. female.  26 year old female presents today for evaluation of concern for infection at surgical site.  Patient on 8/18 underwent fistula repair.  She states she was feeling well since the surgery until today when she noticed significant pain, and purulent drainage.  She denies fever, chills.  Reports a headache as of today as well.  Poor pain control at home today.  Patient spoke with the on-call surgeon today and was recommended to come into the emergency room for evaluation.   The history is provided by the patient. No language interpreter was used.       Home Medications Prior to Admission medications   Medication Sig Start Date End Date Taking? Authorizing Provider  Adalimumab (HUMIRA Woodruff) Inject into the skin every 14 (fourteen) days. Friday's    [provider]  albuterol (VENTOLIN HFA) 108 (90 Base) MCG/ACT inhaler Inhale 1 puff into the lungs as needed for shortness of breath.    [provider]  buPROPion (WELLBUTRIN XL) 150 MG 24 hr tablet Take 150 mg by mouth daily.    [provider]  calcium-vitamin D (OSCAL WITH D) 500-5 MG-MCG tablet Take 1 tablet by mouth. Patient not taking: Reported on 01/15/2022    [provider]  clobetasol cream (TEMOVATE) [id number] % Apply 1 application. topically 2 (two) times daily. Patient not taking: Reported on 02/15/2022    [provider]  Esketamine HCl, 84 MG Dose, (SPRAVATO, 84 MG DOSE,) 28 MG/DEVICE SOPK Place into the nose 2 (two) times a week.    [provider]  famotidine (PEPCID) 40 MG tablet Take 40 mg by mouth at bedtime. 07/24/21   [provider]  ferrous sulfate 324 MG TBEC Take 324 mg by mouth daily with breakfast.    [provider]   hydrOXYzine (ATARAX/VISTARIL) 25 MG tablet Take 25 mg by mouth daily as needed for anxiety. 03/22/21   [provider]  ibuprofen (ADVIL) 600 MG tablet Take 1 tablet (600 mg total) by mouth every 6 (six) hours as needed. Patient not taking: Reported on 02/15/2022 10/12/21   Jesusita Oka, MD  lamoTRIgine (LAMICTAL) 100 MG tablet Take 1 tablet (100 mg total) by mouth 2 (two) times daily. 11/29/19   Connye Burkitt, NP  Multiple Vitamin (MULTIVITAMIN WITH MINERALS) TABS tablet Take 1 tablet by mouth daily.    [provider]  PARAGARD INTRAUTERINE COPPER IU by Intrauterine route once.    [provider]  traMADol (ULTRAM) 50 MG tablet Take 1-2 tablets (50-100 mg total) by mouth every 6 (six) hours as needed. 0000000   Leighton Ruff, MD  traZODone (DESYREL) 150 MG tablet Take 0.5 tablets (75 mg total) by mouth at bedtime. 11/29/19   Connye Burkitt, NP      Allergies    Oxycodone, Lactose intolerance (gi), and Latex    Review of Systems   Review of Systems  Constitutional:  Negative for chills and fever.  Skin:  Positive for wound.  All other systems reviewed and are negative.   Physical Exam Updated Vital Signs BP 119/78 (BP Location: Left Arm)   Pulse 98   Temp 98.1 F (36.7 C) (Oral)   Resp 16   Ht 5\' 1"  (1.549 m)  Wt 98.4 kg   LMP 04/27/2022 (Exact Date)   SpO2 97%   BMI 41.00 kg/m  Physical Exam Vitals and nursing note reviewed.  Constitutional:      General: She is not in acute distress.    Appearance: Normal appearance. She is not ill-appearing.  HENT:     Head: Normocephalic and atraumatic.     Nose: Nose normal.  Eyes:     Conjunctiva/sclera: Conjunctivae normal.  Pulmonary:     Effort: Pulmonary effort is normal. No respiratory distress.  Musculoskeletal:        General: No deformity.  Skin:    Findings: No rash.  Neurological:     Mental Status: She is alert.     ED Results / Procedures / Treatments   Labs (all labs ordered  are listed, but only abnormal results are displayed) Labs Reviewed  CBC WITH DIFFERENTIAL/PLATELET - Abnormal; Notable for the following components:      Result Value   WBC 12.4 (*)    All other components within normal limits  URINALYSIS, ROUTINE W REFLEX MICROSCOPIC - Abnormal; Notable for the following components:   APPearance HAZY (*)    Leukocytes,Ua MODERATE (*)    Bacteria, UA RARE (*)    All other components within normal limits  LACTIC ACID, PLASMA  COMPREHENSIVE METABOLIC PANEL  I-STAT BETA HCG BLOOD, ED ([facility], [facility], [facility] ONLY)    EKG None  Radiology No results found.  Procedures Procedures    Medications Ordered in ED Medications  morphine (PF) 4 MG/ML injection 4 mg (has no administration in time range)  ondansetron (ZOFRAN) injection 4 mg (has no administration in time range)    ED Course/ Medical Decision Making/ A&P Clinical Course as of 05/05/22 2246  Wynelle Link May 05, 2022  2046 Discussed with Dr. [name] with general surgery who recommends obtaining the CT pelvis.  Given her presentation and the open fistula with drainage as long as there is no collection of abscess noted on the CT scan patient is appropriate for discharge with p.o. Augmentin.  He recommends follow-up in clinic with Dr. Maisie Fus Tuesday or Wednesday. [AA]  2137 CT pelvis results are as follows IMPRESSION: 1. Left perianal soft tissue thickening with small abscess measuring 12 x 6 x 7 mm. 2. Small peripherally enhancing air-fluid collection in the low perineal midline measuring 15 x 13 x 14 mm consistent with abscess. [AA]  2138 CT results show evidence of abscess.  Will discuss with Dr. [name]. [AA]  2139 Discussed with Dr. [name] who will come evaluate patient and provide further recommendations. [AA]    Clinical Course User Index [AA] Marita Kansas, PA-C                           Medical Decision Making Amount and/or Complexity of Data Reviewed Labs: ordered. Radiology:  ordered.  Risk Prescription drug management. Decision regarding hospitalization.   Medical Decision Making / ED Course   This patient presents to the ED for concern of surgical site infection, this involves an extensive number of treatment options, and is a complaint that carries with it a high risk of complications and morbidity.  The differential diagnosis includes surgical site infection, perirectal abscess  MDM: 26 year old female presents today for evaluation of concern for infection at surgical site.  Underwent fistula ligation at 8/18.  Patient reports pain and discharge from the site started today.  Spoke to on-call surgeon who recommended ED evaluation.  On  exam patient does have opening at the 7 o'clock position just inferior and lateral to the anus.  Purulent discharge noted from this opening.  Will discuss with on-call general surgeon.  Patient evaluated by Dr. Grandville Silos who will admit patient for further management.   Additional history obtained: -Additional history obtained from recent procedure note for fistula ligation on 8/18 -External records from outside source obtained and reviewed including: Chart review including previous notes, labs, imaging, consultation notes   Lab Tests: -I ordered, reviewed, and interpreted labs.   The pertinent results include:   Labs Reviewed  CBC WITH DIFFERENTIAL/PLATELET - Abnormal; Notable for the following components:      Result Value   WBC 12.4 (*)    All other components within normal limits  URINALYSIS, ROUTINE W REFLEX MICROSCOPIC - Abnormal; Notable for the following components:   APPearance HAZY (*)    Leukocytes,Ua MODERATE (*)    Bacteria, UA RARE (*)    All other components within normal limits  LACTIC ACID, PLASMA  COMPREHENSIVE METABOLIC PANEL  CBC  I-STAT BETA HCG BLOOD, ED ([facility], [facility], [facility] ONLY)      EKG  EKG Interpretation  Date/Time:    Ventricular Rate:    PR Interval:    QRS Duration:   QT Interval:     QTC Calculation:   R Axis:     Text Interpretation:           Imaging Studies ordered: I ordered imaging studies including CT pelvis with contrast I independently visualized and interpreted imaging. I agree with the radiologist interpretation   Medicines ordered and prescription drug management: Meds ordered this encounter  Medications   morphine (PF) 4 MG/ML injection 4 mg   ondansetron (ZOFRAN) injection 4 mg   iohexol (OMNIPAQUE) 300 MG/ML solution 100 mL   enoxaparin (LOVENOX) injection 40 mg   lactated ringers infusion   piperacillin-tazobactam (ZOSYN) IVPB 3.375 g    Order Specific Question:   Antibiotic Indication:    Answer:   Wound Infection   traMADol (ULTRAM) tablet 50 mg   acetaminophen (TYLENOL) tablet 650 mg   morphine (PF) 4 MG/ML injection 4 mg   OR Linked Order Group    diphenhydrAMINE (BENADRYL) capsule 25 mg    diphenhydrAMINE (BENADRYL) injection 25 mg   famotidine (PEPCID) tablet 40 mg   lamoTRIgine (LAMICTAL) tablet 100 mg   traZODone (DESYREL) tablet 75 mg   buPROPion (WELLBUTRIN XL) 24 hr tablet 150 mg   hydrOXYzine (ATARAX) tablet 25 mg    -I have reviewed the patients home medicines and have made adjustments as needed  Reevaluation: After the interventions noted above, I reevaluated the patient and found that they have :improved  Co morbidities that complicate the patient evaluation  Past Medical History:  Diagnosis Date   Anal fistula    Asthma, exercise induced    Bipolar 2 disorder ([place])    Crohn disease ([place])    followed by GI w/ [facility] in [facility],  Dr Alan Ripper ,   dx 06/ 2023   GAD (generalized anxiety disorder)    GERD (gastroesophageal reflux disease)    H/o Lyme disease    per pt dx 2013  now resolved   History of cannabis abuse 2017   History of palpitations    evaluated by peds cardiology 12/ 2016 normal ekg and event monitor   Iron deficiency anemia    MDD (major depressive disorder)    Mood disorder ([place])     OSA (  obstructive sleep apnea)    per pt study showed moderate osa ,  s/p  nasal septoplasy 08/ 2020   PTSD (post-traumatic stress disorder) 09/01/2015      Dispostion: Patient discussed with Dr. [name] of general surgery who will evaluate patient for admission.   Final Clinical Impression(s) / ED Diagnoses Final diagnoses:  Perianal abscess    Rx / DC Orders ED Discharge Orders     None         Marita Kansas, PA-C 05/05/22 2313    Mardene Sayer, MD 05/05/22 2350

## 2022-05-05 NOTE — ED Notes (Signed)
Patient transported to CT 

## 2022-05-05 NOTE — H&P (Signed)
Chelsea Hoover is an 26 y.o. female.   Chief Complaint: perianal pain and drainage HPI: 26yo F S/P ligation of internal anal fistula tract by Dr. Maisie Fus 8/18. She developed increased anal pain and drainage today. She spoke with one of our partners and was directed to the ED. Here, WBC is 12,400 and CT pelvis showed two small perianal abscesses. I was asked to see her for surgical management.  Past Medical History:  Diagnosis Date   Anal fistula    Asthma, exercise induced    Bipolar 2 disorder ([facility])    Crohn disease ([facility])    followed by GI w/ [facility] in [facility],  Dr Cloretta Ned ,   dx 06/ 2023   GAD (generalized anxiety disorder)    GERD (gastroesophageal reflux disease)    H/o Lyme disease    per pt dx 2013  now resolved   History of cannabis abuse 2017   History of palpitations    evaluated by peds cardiology 12/ 2016 normal ekg and event monitor   Iron deficiency anemia    MDD (major depressive disorder)    Mood disorder ([facility])    OSA (obstructive sleep apnea)    per pt study showed moderate osa ,  s/p  nasal septoplasy 08/ 2020   PTSD (post-traumatic stress disorder) 09/01/2015    Past Surgical History:  Procedure Laterality Date   CHOLECYSTECTOMY N/A 10/12/2021   Procedure: LAPAROSCOPIC CHOLECYSTECTOMY;  Surgeon: Diamantina Monks, MD;  Location: [facility] OR;  Service: General;  Laterality: N/A;   INCISION AND DRAINAGE PERIRECTAL ABSCESS N/A 04/29/2021   Procedure: IRRIGATION AND DEBRIDEMENT PERIRECTAL ABSCESS;  Surgeon: Violeta Gelinas, MD;  Location: [facility] OR;  Service: General;  Laterality: N/A;   LIGATION OF INTERNAL FISTULA TRACT N/A 04/19/2022   Procedure: LIGATION OF INTERNAL FISTULA TRACT;  Surgeon: Romie Levee, MD;  Location: [facility];  Service: General;  Laterality: N/A;   PLACEMENT OF SETON N/A 12/13/2021   Procedure: PLACEMENT OF SETON;  Surgeon: Romie Levee, MD;  Location: [facility];  Service: General;  Laterality: N/A;   RECTAL  EXAM UNDER ANESTHESIA N/A 12/13/2021   Procedure: ANORECTAL EXAM UNDER ANESTHESIA;  Surgeon: Romie Levee, MD;  Location: [facility];  Service: General;  Laterality: N/A;   TONSILLECTOMY/ADENOIDECTOMY/TURBINATE REDUCTION Bilateral 04/14/2019   @[facility] ;    AND SEPTOPLASTY   WISDOM TOOTH EXTRACTION  2018    Family History  Problem Relation Age of Onset   Crohn's disease Father    Bipolar disorder Maternal Aunt    Schizophrenia Maternal Grandfather    Bipolar disorder Maternal Grandfather    Social History:  reports that she has never smoked. She has never used smokeless tobacco. She reports that she does not currently use alcohol. She reports that she does not currently use drugs after having used the following drugs: Marijuana.  Allergies:  Allergies  Allergen Reactions   Oxycodone Other (See Comments)    Intense vomiting   Lactose Intolerance (Gi) Other (See Comments)    Pain in stomach   Latex Rash    (Not in a hospital admission)   Results for orders placed or performed during the hospital encounter of 05/05/22 (from the past 48 hour(s))  Lactic acid, plasma     Status: None   Collection Time: 05/05/22  5:29 PM  Result Value Ref Range   Lactic Acid, Venous 1.0 0.5 - 1.9 mmol/L    Comment: Performed at [facility], [address] [place], [place] [address]  Urinalysis, Routine w reflex microscopic Urine, Clean Catch     Status: Abnormal   Collection Time: 05/05/22  5:29 PM  Result Value Ref Range   Color, Urine YELLOW YELLOW   APPearance HAZY (A) CLEAR   Specific Gravity, Urine 1.027 1.005 - 1.030   pH 6.0 5.0 - 8.0   Glucose, UA NEGATIVE NEGATIVE mg/dL   Hgb urine dipstick NEGATIVE NEGATIVE   Bilirubin Urine NEGATIVE NEGATIVE   Ketones, ur NEGATIVE NEGATIVE mg/dL   Protein, ur NEGATIVE NEGATIVE mg/dL   Nitrite NEGATIVE NEGATIVE   Leukocytes,Ua MODERATE (A) NEGATIVE   RBC / HPF 11-20 0 - 5 RBC/hpf   WBC, UA 21-50 0 - 5 WBC/hpf   Bacteria, UA  RARE (A) NONE SEEN   Squamous Epithelial / LPF 11-20 0 - 5    Comment: Performed at [facility], [address] [place], [place] [address]  Comprehensive metabolic panel     Status: None   Collection Time: 05/05/22  5:35 PM  Result Value Ref Range   Sodium 138 135 - 145 mmol/L   Potassium 3.7 3.5 - 5.1 mmol/L   Chloride 105 98 - 111 mmol/L   CO2 26 22 - 32 mmol/L   Glucose, Bld 97 70 - 99 mg/dL    Comment: Glucose reference range applies only to samples taken after fasting for at least 8 hours.   BUN 14 6 - 20 mg/dL   Creatinine, Ser 8.29 0.44 - 1.00 mg/dL   Calcium 9.3 8.9 - 93.7 mg/dL   Total Protein 6.9 6.5 - 8.1 g/dL   Albumin 4.0 3.5 - 5.0 g/dL   AST 28 15 - 41 U/L   ALT 36 0 - 44 U/L   Alkaline Phosphatase 75 38 - 126 U/L   Total Bilirubin 0.4 0.3 - 1.2 mg/dL   GFR, Estimated >16 >96 mL/min    Comment: (NOTE) Calculated using the CKD-EPI Creatinine Equation (2021)    Anion gap 7 5 - 15    Comment: Performed at [facility], [address], [place], [place] [address]  CBC with Differential     Status: Abnormal   Collection Time: 05/05/22  5:35 PM  Result Value Ref Range   WBC 12.4 (H) 4.0 - 10.5 K/uL   RBC 4.46 3.87 - 5.11 MIL/uL   Hemoglobin 12.7 12.0 - 15.0 g/dL   HCT 10.1 75.1 - 02.5 %   MCV 86.1 80.0 - 100.0 fL   MCH 28.5 26.0 - 34.0 pg   MCHC 33.1 30.0 - 36.0 g/dL   RDW 85.2 77.8 - 24.2 %   Platelets 370 150 - 400 K/uL   nRBC 0.0 0.0 - 0.2 %   Neutrophils Relative % 60 %   Neutro Abs 7.4 1.7 - 7.7 K/uL   Lymphocytes Relative 31 %   Lymphs Abs 3.9 0.7 - 4.0 K/uL   Monocytes Relative 6 %   Monocytes Absolute 0.7 0.1 - 1.0 K/uL   Eosinophils Relative 2 %   Eosinophils Absolute 0.3 0.0 - 0.5 K/uL   Basophils Relative 1 %   Basophils Absolute 0.1 0.0 - 0.1 K/uL   Immature Granulocytes 0 %   Abs Immature Granulocytes 0.03 0.00 - 0.07 K/uL    Comment: Performed at [facility], [address] [place], [place] [address]  I-Stat beta hCG  blood, ED     Status: None   Collection Time: 05/05/22  5:52 PM  Result  Value Ref Range   I-stat hCG, quantitative <5.0 <5 mIU/mL   Comment 3            Comment:   GEST. AGE      CONC.  (mIU/mL)   <=1 WEEK        5 - 50     2 WEEKS       50 - 500     3 WEEKS       100 - 10,000     4 WEEKS     1,000 - 30,000        FEMALE AND NON-PREGNANT FEMALE:     LESS THAN 5 mIU/mL    CT PELVIS W CONTRAST  Result Date: 05/05/2022 CLINICAL DATA:  Worsening pain and drainage after anal fistula repair. EXAM: CT PELVIS WITH CONTRAST TECHNIQUE: Multidetector CT imaging of the pelvis was performed using the standard protocol following the bolus administration of intravenous contrast. RADIATION DOSE REDUCTION: This exam was performed according to the departmental dose-optimization program which includes automated exposure control, adjustment of the mA and/or kV according to patient size and/or use of iterative reconstruction technique. CONTRAST:  OMNIPAQUE IOHEXOL 300 MG/ML  SOLN COMPARISON:  Pelvic MRI 11/14/2021, pelvic CT 04/27/2021 FINDINGS: Urinary Tract: Included kidneys, ureters, and bladder are unremarkable. Bowel: There is left perianal soft tissue thickening. Small fluid collection in the area of soft tissue thickening measures 12 x 6 x 7 mm, series 3, image 54, series 5, image 92. Small peripherally enhancing air-fluid collection in the low perineal midline measures 15 x 13 x 14 mm, series 3, image 57 and series 5, image 93. There is no inflammation of pelvic small or large bowel. Normal appendix. Vascular/Lymphatic: 10 mm left inguinal node, likely reactive. Additional small bilateral pelvic and inguinal nodes are not enlarged by size criteria. There are no acute vascular findings. Reproductive: IUD in the uterus which may be low in positioning in the endocervical canal, but stable from prior. Symmetric appearance of the ovaries, no cyst or adnexal mass. Other:  No pelvic free fluid.  Tiny fat containing  umbilical hernia. Musculoskeletal: There are no acute or suspicious osseous abnormalities. IMPRESSION: 1. Left perianal soft tissue thickening with small abscess measuring 12 x 6 x 7 mm. 2. Small peripherally enhancing air-fluid collection in the low perineal midline measuring 15 x 13 x 14 mm consistent with abscess. 3. IUD in the uterus which may be low in positioning in the endocervical canal, but stable from prior. Electronically Signed   By: Narda Rutherford M.D.   On: 05/05/2022 21:11    Review of Systems  Blood pressure 102/68, pulse 75, temperature 98 F (36.7 C), temperature source Oral, resp. rate 16, height 5\' 1"  (1.549 m), weight 98.4 kg, last menstrual period 04/27/2022, SpO2 98 %. Physical Exam Constitutional:      General: She is not in acute distress. HENT:     Nose: Nose normal.  Cardiovascular:     Rate and Rhythm: Normal rate and regular rhythm.  Pulmonary:     Effort: Pulmonary effort is normal.     Breath sounds: Normal breath sounds.  Abdominal:     General: There is no distension.     Palpations: Abdomen is soft.     Tenderness: There is no abdominal tenderness.  Genitourinary:    Comments: Anal area appears post-op, mild purulent drainage, no large induration Skin:    General: Skin is warm.  Neurological:     Mental Status: She  is alert and oriented to person, place, and time.  Psychiatric:        Mood and Affect: Mood normal.      Assessment/Plan S/P ligation of internal anal fistula tract by Dr. Maisie Fus 8/18 Now with 2 small perianal abscesses  Will admit for IV ABX, pain control. She does not need emergent drainage and these should improve on ABX. I will D/W Dr. Maisie Fus in the AM. Liz Malady, MD 05/05/2022, 10:30 PM

## 2022-05-06 LAB — CBC
HCT: 35.8 % — ABNORMAL LOW (ref 36.0–46.0)
Hemoglobin: 12.3 g/dL (ref 12.0–15.0)
MCH: 29.1 pg (ref 26.0–34.0)
MCHC: 34.4 g/dL (ref 30.0–36.0)
MCV: 84.6 fL (ref 80.0–100.0)
Platelets: 320 10*3/uL (ref 150–400)
RBC: 4.23 MIL/uL (ref 3.87–5.11)
RDW: 12.9 % (ref 11.5–15.5)
WBC: 10.1 10*3/uL (ref 4.0–10.5)
nRBC: 0 % (ref 0.0–0.2)

## 2022-05-06 LAB — SURGICAL PCR SCREEN
MRSA, PCR: NEGATIVE
Staphylococcus aureus: NEGATIVE

## 2022-05-06 MED ORDER — BUPROPION HCL ER (XL) 150 MG PO TB24
150.0000 mg | ORAL_TABLET | Freq: Every day | ORAL | Status: DC
  Administered 2022-05-06: 150 mg via ORAL
  Filled 2022-05-06: qty 1

## 2022-05-06 MED ORDER — MUPIROCIN 2 % EX OINT
1.0000 | TOPICAL_OINTMENT | Freq: Two times a day (BID) | CUTANEOUS | Status: DC
  Administered 2022-05-06: 1 via NASAL
  Filled 2022-05-06: qty 22

## 2022-05-06 MED ORDER — TRAMADOL HCL 50 MG PO TABS: 50.0000 mg | ORAL_TABLET | Freq: Four times a day (QID) | ORAL | 0 refills | Status: DC | PRN

## 2022-05-06 NOTE — Plan of Care (Signed)

## 2022-05-06 NOTE — Discharge Summary (Signed)
Physician Discharge Summary  Patient ID: Chelsea Hoover MRN: [id number] DOB/AGE: [birth year] 26 y.o.  Admit date: 05/05/2022 Discharge date: 05/06/2022  Admission Diagnoses:Perianal abscess  Discharge Diagnoses:  Principal Problem: post op pain      Discharged Condition: good  Hospital Course: patient admitted for concern of perianal abscess.  None noted on physical exam.  CT showing fluid from surgery only.  No signs of infection on exam and wounds draining appropriately.  Consults: None  Significant Diagnostic Studies: labs: cbc, CT scan  Treatments: IV hydration and analgesia: acetaminophen  Discharge Exam: Blood pressure 108/61, pulse 66, temperature 98 F (36.7 C), temperature source Oral, resp. rate 16, height 5\' 1"  (1.549 m), weight 98.4 kg, last menstrual period 04/27/2022, SpO2 98 %. General appearance: alert GI: nontender Rectal: no cellulitis, expected post op drainage, no abscess Disposition: home   Allergies as of 05/06/2022       Reactions   Roxicodone [oxycodone] Nausea And Vomiting   Lactose Intolerance (gi) Diarrhea, Other (See Comments)   Stomach pain   Latex Rash        Medication List     TAKE these medications    albuterol 108 (90 Base) MCG/ACT inhaler Commonly known as: VENTOLIN HFA Inhale 1 puff into the lungs as needed for shortness of breath.   Azelastine-Fluticasone 137-50 MCG/ACT Susp Place 1 spray into the nose daily as needed for allergies.   buPROPion 150 MG 24 hr tablet Commonly known as: WELLBUTRIN XL Take 150 mg by mouth daily.   famotidine 40 MG tablet Commonly known as: PEPCID Take 40 mg by mouth at bedtime.   famotidine 20 MG tablet Commonly known as: PEPCID Take 20 mg by mouth every 12 (twelve) hours as needed for heartburn.   ferrous sulfate 324 MG Tbec Take 324 mg by mouth daily with breakfast.   Humira 40 MG/0.4ML Pskt Generic drug: Adalimumab Inject 0.4 mg into the skin every 14 (fourteen) days.   hydrOXYzine  25 MG tablet Commonly known as: ATARAX Take 25 mg by mouth daily as needed for anxiety.   lamoTRIgine 100 MG tablet Commonly known as: LAMICTAL Take 1 tablet (100 mg total) by mouth 2 (two) times daily.   multivitamin with minerals Tabs tablet Take 1 tablet by mouth daily.   PARAGARD INTRAUTERINE COPPER IU by Intrauterine route once.   Spravato (84 MG Dose) 28 MG/DEVICE Sopk Generic drug: Esketamine HCl (84 MG Dose) Place into the nose 2 (two) times a week.   traMADol 50 MG tablet Commonly known as: ULTRAM Take 1-2 tablets (50-100 mg total) by mouth every 6 (six) hours as needed (pain).   traZODone 150 MG tablet Commonly known as: DESYREL Take 0.5 tablets (75 mg total) by mouth at bedtime.        Follow-up Information     07/06/2022, MD. Schedule an appointment as soon as possible for a visit in 2 week(s).   Specialties: General Surgery, Colon and Rectal Surgery Contact information: [address] [place] [phone]                 Signed: [phone] 05/06/2022, 10:00 AM

## 2022-05-06 NOTE — Discharge Instructions (Addendum)
ANORECTAL SURGERY: POST OP INSTRUCTIONS Take your usually prescribed home medications unless otherwise directed. DIET: During the first few hours after surgery sip on some liquids until you are able to urinate.  It is normal to not urinate for several hours after this surgery.  If you feel uncomfortable, please contact the office for instructions.  After you are able to urinate,you may eat, if you feel like it.  Follow a light bland diet the first 24 hours after arrival home, such as soup, liquids, crackers, etc.  Be sure to include lots of fluids daily (6-8 glasses).  Avoid fast food or heavy meals, as your are more likely to get nauseated.  Eat a low fat diet the next few days after surgery.  Limit caffeine intake to 1-2 servings a day. PAIN CONTROL: Pain is best controlled by a usual combination of several different methods TOGETHER:  Soak in a warm bath (or Sitz bath) three times a day and after bowel movements.  Continue to do this until all pain is resolved. Over the counter pain medication Prescription pain medication Most patients will experience some swelling and discomfort in the anus/rectal area and incisions.  Heat such as warm towels, sitz baths, warm baths, etc to help relax tight/sore spots and speed recovery.  Some people prefer to use ice, especially in the first couple days after surgery, as it may decrease the pain and swelling, or alternate between ice & heat.  Experiment to what works for you.  Swelling and bruising can take several weeks to resolve.  Pain can take even longer to completely resolve. It is helpful to take an over-the-counter pain medication regularly for the first few weeks.  Choose one of the following that works best for you: Naproxen (Aleve, etc)  Two 220mg  tabs twice a day Ibuprofen (Advil, etc) Three 200mg  tabs four times a day (every meal & bedtime) A  prescription for pain medication (such as percocet, oxycodone, hydrocodone, etc) should be given to you upon  discharge.  Take your pain medication as prescribed.  If you are having problems/concerns with the prescription medicine (does not control pain, nausea, vomiting, rash, itching, etc), please call [phone] to see if we need to switch you to a different pain medicine that will work better for you and/or control your side effect better. If you need a refill on your pain medication, please contact your pharmacy.  They will contact our office to request authorization. Prescriptions will not be filled after 5 pm or on week-ends. KEEP YOUR BOWELS REGULAR and AVOID CONSTIPATION The goal is one to two soft bowel movements a day.  You should at least have a bowel movement every other day. Avoid getting constipated.  Between the surgery and the pain medications, it is common to experience some constipation. This can be very painful after rectal surgery.  Increasing fluid intake and taking a fiber supplement (such as Metamucil, Citrucel, FiberCon, etc) 1-2 times a day regularly will usually help prevent this problem from occurring.  A stool softener like colace is also recommended.  This can be purchased over the counter at your pharmacy.  You can take it up to 3 times a day.  If you do not have a bowel movement after 24 hrs since your surgery, take one does of milk of magnesia.  If you still haven't had a bowel movement 8-12 hours after that dose, take another dose.  If you don't have a bowel movement 48 hrs after surgery, purchase  a Fleets enema from the drug store and administer gently per package instructions.  If you still are having trouble with your bowel movements after that, please call the office for further instructions. If you develop diarrhea or have many loose bowel movements, simplify your diet to bland foods & liquids for a few days.  Stop any stool softeners and decrease your fiber supplement.  Switching to mild anti-diarrheal medications (Kayopectate, Pepto Bismol) can help.  If this worsens or  does not improve, please call us.  Wound Care Remove your bandages before your first bowel movement or 8 hours after surgery.     Remove any wound packing material at this tim,e as well.  You do not need to repack the wound unless instructed otherwise.  Wear an absorbent pad or soft cotton gauze in your underwear to catch any drainage and help keep the area clean. You should change this every 2-3 hours while awake. Keep the area clean and dry.  Bathe / shower every day, especially after bowel movements.  Keep the area clean by showering / bathing over the incision / wound.   It is okay to soak an open wound to help wash it.  Wet wipes or showers / gentle washing after bowel movements is often less traumatic than regular toilet paper. You may have some styrofoam-like soft packing in the rectum which will come out with the first bowel movement.  You will often notice bleeding with bowel movements.  This should slow down by the end of the first week of surgery Expect some drainage.  This should slow down, too, by the end of the first week of surgery.  Wear an absorbent pad or soft cotton gauze in your underwear until the drainage stops. Do Not sit on a rubber or pillow ring.  This can make you symptoms worse.  You may sit on a soft pillow if needed.  ACTIVITIES as tolerated:   You may resume regular (light) daily activities beginning the next day--such as daily self-care, walking, climbing stairs--gradually increasing activities as tolerated.  If you can walk 30 minutes without difficulty, it is safe to try more intense activity such as jogging, treadmill, bicycling, low-impact aerobics, swimming, etc. Save the most intensive and strenuous activity for last such as sit-ups, heavy lifting, contact sports, etc  Refrain from any heavy lifting or straining until you are off narcotics for pain control.   You may drive when you are no longer taking prescription pain medication, you can comfortably sit for long  periods of time, and you can safely maneuver your car and apply brakes. You may have sexual intercourse when it is comfortable.  FOLLOW UP in our office Please call [facility] at [phone] to set up an appointment to see your surgeon in the office for a follow-up appointment approximately 3-4 weeks after your surgery. Make sure that you call for this appointment the day you arrive home to insure a convenient appointment time. 10. IF YOU HAVE DISABILITY OR FAMILY LEAVE FORMS, BRING THEM TO THE OFFICE FOR PROCESSING.  DO NOT GIVE THEM TO YOUR DOCTOR.     WHEN TO CALL us [phone]: Poor pain control Reactions / problems with new medications (rash/itching, nausea, etc)  Fever over 101.5 F (38.5 C) Inability to urinate Nausea and/or vomiting Worsening swelling or bruising Continued bleeding from incision. Increased pain, redness, or drainage from the incision  The clinic staff is available to answer your questions during regular business hours (8:30am-5pm).  Please  don't hesitate to call and ask to speak to one of our nurses for clinical concerns.   A surgeon from [facility] is always on call at the hospitals   If you have a medical emergency, go to the nearest emergency room or call 911.    [facility] [address], [place], [place]  [address] ? MAIN: [phone] ? TOLL FREE: [phone] ? FAX [phone] [email or web address]

## 2022-05-20 ENCOUNTER — Other Ambulatory Visit: Payer: Self-pay | Admitting: Obstetrics

## 2022-08-02 ENCOUNTER — Encounter (HOSPITAL_BASED_OUTPATIENT_CLINIC_OR_DEPARTMENT_OTHER): Payer: Self-pay | Admitting: Obstetrics

## 2022-08-02 HISTORY — PX: BILATERAL SALPINGECTOMY: SHX5743

## 2022-08-02 NOTE — Progress Notes (Signed)
Spoke w/ via phone for pre-op interview--- pt Lab needs dos---- cbc, t&s, urine preg              Lab results------ no COVID test -----patient states asymptomatic no test needed Arrive at ------- 0630 on [phone] NPO after MN NO Solid Food.  Clear liquids from MN until--- 0530 Med rec completed Medications to take morning of surgery ----- wellbutrin, lamictal Diabetic medication ----- n/a Patient instructed no nail polish to be worn day of surgery Patient instructed to bring photo id and insurance card day of surgery Patient aware to have Driver (ride ) / caregiver    for 24 hours after surgery -- mother, nalani Patient Special Instructions -----asked to bring rescue inhaler dos Pre-Op special Istructions ----- n/a Patient verbalized understanding of instructions that were given at this phone interview. Patient denies shortness of breath, chest pain, fever, cough at this phone interview.

## 2022-08-07 ENCOUNTER — Encounter (HOSPITAL_BASED_OUTPATIENT_CLINIC_OR_DEPARTMENT_OTHER): Payer: Self-pay | Admitting: Obstetrics

## 2022-08-07 ENCOUNTER — Ambulatory Visit (HOSPITAL_BASED_OUTPATIENT_CLINIC_OR_DEPARTMENT_OTHER)
Admission: RE | Admit: 2022-08-07 | Discharge: 2022-08-07 | Disposition: A | Discharge status: Home / Self Care | Payer: Commercial Managed Care - HMO | Attending: Obstetrics | Admitting: Obstetrics

## 2022-08-07 ENCOUNTER — Other Ambulatory Visit: Payer: Self-pay

## 2022-08-07 ENCOUNTER — Encounter (HOSPITAL_BASED_OUTPATIENT_CLINIC_OR_DEPARTMENT_OTHER)
Admission: RE | Disposition: A | Discharge status: Home / Self Care | Payer: Self-pay | Source: Home / Self Care | Attending: Obstetrics

## 2022-08-07 ENCOUNTER — Ambulatory Visit (HOSPITAL_BASED_OUTPATIENT_CLINIC_OR_DEPARTMENT_OTHER): Payer: Commercial Managed Care - HMO | Admitting: Anesthesiology

## 2022-08-07 DIAGNOSIS — F418 Other specified anxiety disorders: Secondary | ICD-10-CM

## 2022-08-07 DIAGNOSIS — J45909 Unspecified asthma, uncomplicated: Secondary | ICD-10-CM | POA: Diagnosis not present

## 2022-08-07 DIAGNOSIS — G4733 Obstructive sleep apnea (adult) (pediatric): Secondary | ICD-10-CM | POA: Diagnosis not present

## 2022-08-07 DIAGNOSIS — Z01818 Encounter for other preprocedural examination: Secondary | ICD-10-CM

## 2022-08-07 DIAGNOSIS — G4730 Sleep apnea, unspecified: Secondary | ICD-10-CM | POA: Diagnosis not present

## 2022-08-07 DIAGNOSIS — Z302 Encounter for sterilization: Secondary | ICD-10-CM | POA: Diagnosis not present

## 2022-08-07 DIAGNOSIS — K219 Gastro-esophageal reflux disease without esophagitis: Secondary | ICD-10-CM | POA: Insufficient documentation

## 2022-08-07 HISTORY — PX: IUD REMOVAL: SHX5392

## 2022-08-07 HISTORY — PX: LAPAROSCOPIC TUBAL LIGATION: SHX1937

## 2022-08-07 HISTORY — DX: Personal history of other diseases of the digestive system: Z87.19

## 2022-08-07 LAB — CBC
HCT: 40.5 % (ref 36.0–46.0)
Hemoglobin: 13.5 g/dL (ref 12.0–15.0)
MCH: 28.4 pg (ref 26.0–34.0)
MCHC: 33.3 g/dL (ref 30.0–36.0)
MCV: 85.1 fL (ref 80.0–100.0)
Platelets: 494 10*3/uL — ABNORMAL HIGH (ref 150–400)
RBC: 4.76 MIL/uL (ref 3.87–5.11)
RDW: 12.3 % (ref 11.5–15.5)
WBC: 18 10*3/uL — ABNORMAL HIGH (ref 4.0–10.5)
nRBC: 0 % (ref 0.0–0.2)

## 2022-08-07 LAB — POCT PREGNANCY, URINE: Preg Test, Ur: NEGATIVE

## 2022-08-07 LAB — TYPE AND SCREEN
ABO/RH(D): O POS
Antibody Screen: NEGATIVE

## 2022-08-07 LAB — ABO/RH: ABO/RH(D): O POS

## 2022-08-07 SURGERY — LIGATION, FALLOPIAN TUBE, LAPAROSCOPIC
Anesthesia: General | Site: Vagina | Laterality: Bilateral

## 2022-08-07 MED ORDER — ACETAMINOPHEN 10 MG/ML IV SOLN: 1000.0000 mg | Freq: Once | INTRAVENOUS | Status: DC | PRN

## 2022-08-07 MED ORDER — OXYCODONE HCL 5 MG PO TABS: 5.0000 mg | ORAL_TABLET | Freq: Once | ORAL | Status: DC | PRN

## 2022-08-07 MED ORDER — MIDAZOLAM HCL 5 MG/5ML IJ SOLN
INTRAMUSCULAR | Status: DC | PRN
  Administered 2022-08-07: 2 mg via INTRAVENOUS

## 2022-08-07 MED ORDER — BUPIVACAINE HCL (PF) 0.25 % IJ SOLN
INTRAMUSCULAR | Status: AC
  Filled 2022-08-07: qty 30

## 2022-08-07 MED ORDER — DEXAMETHASONE SODIUM PHOSPHATE 4 MG/ML IJ SOLN
INTRAMUSCULAR | Status: DC | PRN
  Administered 2022-08-07: 5 mg via INTRAVENOUS

## 2022-08-07 MED ORDER — FENTANYL CITRATE (PF) 100 MCG/2ML IJ SOLN
INTRAMUSCULAR | Status: AC
  Filled 2022-08-07: qty 2

## 2022-08-07 MED ORDER — FENTANYL CITRATE (PF) 100 MCG/2ML IJ SOLN
25.0000 ug | INTRAMUSCULAR | Status: DC | PRN
  Administered 2022-08-07 (×2): 25 ug via INTRAVENOUS

## 2022-08-07 MED ORDER — PROPOFOL 10 MG/ML IV BOLUS
INTRAVENOUS | Status: AC
  Filled 2022-08-07: qty 20

## 2022-08-07 MED ORDER — BACITRACIN ZINC 500 UNIT/GM EX OINT
TOPICAL_OINTMENT | CUTANEOUS | Status: AC
  Filled 2022-08-07: qty 28.35

## 2022-08-07 MED ORDER — PROMETHAZINE HCL 25 MG/ML IJ SOLN: 6.2500 mg | INTRAMUSCULAR | Status: DC | PRN

## 2022-08-07 MED ORDER — ROCURONIUM BROMIDE 10 MG/ML (PF) SYRINGE
PREFILLED_SYRINGE | INTRAVENOUS | Status: AC
  Filled 2022-08-07: qty 10

## 2022-08-07 MED ORDER — ACETAMINOPHEN 325 MG PO TABS: 325.0000 mg | ORAL_TABLET | ORAL | Status: DC | PRN

## 2022-08-07 MED ORDER — KETOROLAC TROMETHAMINE 30 MG/ML IJ SOLN
INTRAMUSCULAR | Status: DC | PRN
  Administered 2022-08-07: 30 mg via INTRAVENOUS

## 2022-08-07 MED ORDER — SODIUM CHLORIDE 0.9 % IR SOLN
Status: DC | PRN
  Administered 2022-08-07: 1000 mL

## 2022-08-07 MED ORDER — PROPOFOL 10 MG/ML IV BOLUS
INTRAVENOUS | Status: DC | PRN
  Administered 2022-08-07: 200 mg via INTRAVENOUS

## 2022-08-07 MED ORDER — ROCURONIUM BROMIDE 100 MG/10ML IV SOLN
INTRAVENOUS | Status: DC | PRN
  Administered 2022-08-07: 60 mg via INTRAVENOUS
  Administered 2022-08-07: 20 mg via INTRAVENOUS

## 2022-08-07 MED ORDER — BUPIVACAINE HCL (PF) 0.25 % IJ SOLN
INTRAMUSCULAR | Status: DC | PRN
  Administered 2022-08-07: 30 mL

## 2022-08-07 MED ORDER — SILVER NITRATE-POT NITRATE 75-25 % EX MISC
CUTANEOUS | Status: AC
  Filled 2022-08-07: qty 10

## 2022-08-07 MED ORDER — DEXMEDETOMIDINE HCL IN NACL 80 MCG/20ML IV SOLN
INTRAVENOUS | Status: DC | PRN
  Administered 2022-08-07 (×4): 4 ug via BUCCAL

## 2022-08-07 MED ORDER — ONDANSETRON HCL 4 MG/2ML IJ SOLN
INTRAMUSCULAR | Status: AC
  Filled 2022-08-07: qty 2

## 2022-08-07 MED ORDER — MIDAZOLAM HCL 2 MG/2ML IJ SOLN
INTRAMUSCULAR | Status: AC
  Filled 2022-08-07: qty 2

## 2022-08-07 MED ORDER — FENTANYL CITRATE (PF) 100 MCG/2ML IJ SOLN
INTRAMUSCULAR | Status: DC | PRN
  Administered 2022-08-07 (×4): 25 ug via INTRAVENOUS
  Administered 2022-08-07: 100 ug via INTRAVENOUS

## 2022-08-07 MED ORDER — IBUPROFEN 800 MG PO TABS: 800.0000 mg | ORAL_TABLET | Freq: Three times a day (TID) | ORAL | 1 refills | Status: DC | PRN

## 2022-08-07 MED ORDER — HYDROCODONE-ACETAMINOPHEN 5-325 MG PO TABS: 2.0000 | ORAL_TABLET | Freq: Four times a day (QID) | ORAL | 0 refills | Status: DC | PRN

## 2022-08-07 MED ORDER — DEXAMETHASONE SODIUM PHOSPHATE 10 MG/ML IJ SOLN
INTRAMUSCULAR | Status: AC
  Filled 2022-08-07: qty 1

## 2022-08-07 MED ORDER — ONDANSETRON HCL 4 MG/2ML IJ SOLN
INTRAMUSCULAR | Status: DC | PRN
  Administered 2022-08-07: 4 mg via INTRAVENOUS

## 2022-08-07 MED ORDER — LACTATED RINGERS IV SOLN: INTRAVENOUS | Status: DC

## 2022-08-07 MED ORDER — POVIDONE-IODINE 10 % EX SWAB: 2.0000 | Freq: Once | CUTANEOUS | Status: DC

## 2022-08-07 MED ORDER — LIDOCAINE HCL (CARDIAC) PF 100 MG/5ML IV SOSY
PREFILLED_SYRINGE | INTRAVENOUS | Status: DC | PRN
  Administered 2022-08-07: 60 mg via INTRAVENOUS

## 2022-08-07 MED ORDER — OXYCODONE HCL 5 MG/5ML PO SOLN: 5.0000 mg | Freq: Once | ORAL | Status: DC | PRN

## 2022-08-07 MED ORDER — LIDOCAINE HCL (PF) 2 % IJ SOLN
INTRAMUSCULAR | Status: AC
  Filled 2022-08-07: qty 5

## 2022-08-07 MED ORDER — SCOPOLAMINE 1 MG/3DAYS TD PT72: 1.0000 | MEDICATED_PATCH | TRANSDERMAL | Status: DC

## 2022-08-07 MED ORDER — ACETAMINOPHEN 160 MG/5ML PO SOLN: 325.0000 mg | ORAL | Status: DC | PRN

## 2022-08-07 MED ORDER — AMISULPRIDE (ANTIEMETIC) 5 MG/2ML IV SOLN: 10.0000 mg | Freq: Once | INTRAVENOUS | Status: DC | PRN

## 2022-08-07 MED ORDER — SUGAMMADEX SODIUM 200 MG/2ML IV SOLN
INTRAVENOUS | Status: DC | PRN
  Administered 2022-08-07: 200 mg via INTRAVENOUS

## 2022-08-07 SURGICAL SUPPLY — 35 items
ADH SKN CLS APL DERMABOND .7 (GAUZE/BANDAGES/DRESSINGS) ×2
CABLE HIGH FREQUENCY MONO STRZ (ELECTRODE) IMPLANT
COVER MAYO STAND STRL (DRAPES) ×2 IMPLANT
DERMABOND ADVANCED .7 DNX12 (GAUZE/BANDAGES/DRESSINGS) ×2 IMPLANT
DRSG OPSITE POSTOP 3X4 (GAUZE/BANDAGES/DRESSINGS) IMPLANT
DURAPREP 26ML APPLICATOR (WOUND CARE) ×2 IMPLANT
FORCEPS CUTTING 33CM 5MM (CUTTING FORCEPS) ×2 IMPLANT
FORCEPS CUTTING 45CM 5MM (CUTTING FORCEPS) IMPLANT
GAUZE 4X4 16PLY ~~LOC~~+RFID DBL (SPONGE) ×4 IMPLANT
GLOVE BIO SURGEON STRL SZ 6.5 (GLOVE) ×2 IMPLANT
GLOVE BIOGEL PI IND STRL 7.0 (GLOVE) ×8 IMPLANT
GOWN STRL REUS W/TWL LRG LVL3 (GOWN DISPOSABLE) ×6 IMPLANT
KIT TURNOVER CYSTO (KITS) ×2 IMPLANT
MANIPULATOR UTERINE 4.5 ZUMI (MISCELLANEOUS) ×2 IMPLANT
NDL INSUFFLATION 14GA 120MM (NEEDLE) ×2 IMPLANT
NEEDLE INSUFFLATION 14GA 120MM (NEEDLE) ×2 IMPLANT
PACK LAPAROSCOPY BASIN (CUSTOM PROCEDURE TRAY) ×2 IMPLANT
PACK TRENDGUARD 450 HYBRID PRO (MISCELLANEOUS) IMPLANT
PENCIL SMOKE EVACUATOR (MISCELLANEOUS) ×2 IMPLANT
PROTECTOR NERVE ULNAR (MISCELLANEOUS) ×4 IMPLANT
SET SUCTION IRRIG HYDROSURG (IRRIGATION / IRRIGATOR) IMPLANT
SET TUBE SMOKE EVAC HIGH FLOW (TUBING) ×2 IMPLANT
SLEEVE Z-THREAD 5X100MM (TROCAR) ×2 IMPLANT
SOLUTION ELECTROLUBE (MISCELLANEOUS) ×2 IMPLANT
SUT VIC AB 4-0 PS2 18 (SUTURE) ×2 IMPLANT
SUT VICRYL 0 UR6 27IN ABS (SUTURE) ×2 IMPLANT
SYS BAG RETRIEVAL 10MM (BASKET)
SYSTEM BAG RETRIEVAL 10MM (BASKET) IMPLANT
TOWEL OR 17X26 10 PK STRL BLUE (TOWEL DISPOSABLE) ×2 IMPLANT
TRAY FOLEY W/BAG SLVR 14FR LF (SET/KITS/TRAYS/PACK) ×2 IMPLANT
TRENDGUARD 450 HYBRID PRO PACK (MISCELLANEOUS)
TROCAR 5MMX150MM (TROCAR) IMPLANT
TROCAR BALLN 12MMX100 BLUNT (TROCAR) ×2 IMPLANT
TROCAR Z-THREAD FIOS 5X100MM (TROCAR) ×4 IMPLANT
WARMER LAPAROSCOPE (MISCELLANEOUS) ×2 IMPLANT

## 2022-08-07 NOTE — Brief Op Note (Signed)
08/07/2022  11:57 AM  PATIENT:  Chelsea Hoover  26 y.o. female  PRE-OPERATIVE DIAGNOSIS:  Elective Sterilization  POST-OPERATIVE DIAGNOSIS:  Elective Sterilization  PROCEDURE:  Procedure(s): LAPAROSCOPIC TUBAL LIGATION By Salpingectomy (Bilateral) INTRAUTERINE DEVICE (IUD) REMOVAL  SURGEON:  Surgeon(s) and Role:    * Noland Fordyce, MD - Primary  PHYSICIAN ASSISTANT:   ASSISTANTS: Reina Fuse, CNM  ANESTHESIA:   local and general  EBL:  25 mL   BLOOD ADMINISTERED:none  DRAINS: Urinary Catheter (Foley)   LOCAL MEDICATIONS USED:  MARCAINE     SPECIMEN:  Source of Specimen:  Bilateral fallopian tubes  DISPOSITION OF SPECIMEN:  PATHOLOGY  COUNTS:  YES  TOURNIQUET:  * No tourniquets in log *  DICTATION: .Note written in EPIC  PLAN OF CARE: Discharge to home after PACU  PATIENT DISPOSITION:  PACU - hemodynamically stable.   Delay start of Pharmacological VTE agent (>24hrs) due to surgical blood loss or risk of bleeding: yes

## 2022-08-07 NOTE — Anesthesia Procedure Notes (Signed)
Procedure Name: Intubation Date/Time: 08/07/2022 10:34 AM  Performed by: Justice Rocher, CRNAPre-anesthesia Checklist: Patient identified, Emergency Drugs available, Suction available, Patient being monitored and Timeout performed Patient Re-evaluated:Patient Re-evaluated prior to induction Oxygen Delivery Method: Circle system utilized Preoxygenation: Pre-oxygenation with 100% oxygen Induction Type: IV induction Ventilation: Mask ventilation without difficulty Laryngoscope Size: Mac and 3 Grade View: Grade II Tube type: Oral Tube size: 7.0 mm Number of attempts: 1 Airway Equipment and Method: Stylet and Oral airway Placement Confirmation: ETT inserted through vocal cords under direct vision, positive ETCO2, breath sounds checked- equal and bilateral and CO2 detector Secured at: 23 cm Tube secured with: Tape Dental Injury: Teeth and Oropharynx as per pre-operative assessment

## 2022-08-07 NOTE — Discharge Instructions (Addendum)
DISCHARGE INSTRUCTIONS: Laparoscopy  The following instructions have been prepared to help you care for yourself upon your return home today.  Wound care:  Do not get the incision wet for the first 24 hours. The incision should be kept clean and dry.  The Band-Aids or dressings may be removed the day after surgery.  Should the incision become sore, red, and swollen after the first week, check with your doctor.  Personal hygiene:  Shower the day after your procedure.  Activity and limitations:  Do NOT drive or operate any equipment today.  Do NOT lift anything more than 15 pounds for 2-3 weeks after surgery.  Do NOT rest in bed all day.  Walking is encouraged. Walk each day, starting slowly with 5-minute walks 3 or 4 times a day. Slowly increase the length of your walks.  Walk up and down stairs slowly.  Do NOT do strenuous activities, such as golfing, playing tennis, bowling, running, biking, weight lifting, gardening, mowing, or vacuuming for 2-4 weeks. Ask your doctor when it is okay to start.  Diet: Eat a light meal as desired this evening. You may resume your usual diet tomorrow.  Return to work: This is dependent on the type of work you do. For the most part you can return to a desk job within a week of surgery. If you are more active at work, please discuss this with your doctor.  What to expect after your surgery: You may have a slight burning sensation when you urinate on the first day. You may have a very small amount of blood in the urine. Expect to have a small amount of vaginal discharge/light bleeding for 1-2 weeks. It is not unusual to have abdominal soreness and bruising for up to 2 weeks. You may be tired and need more rest for about 1 week. You may experience shoulder pain for 24-72 hours. Lying flat in bed may relieve it.  Call your doctor for any of the following:  Develop a fever of 100.4 or greater  Inability to urinate 6 hours after discharge from hospital  Severe  pain not relieved by pain medications  Persistent of heavy bleeding at incision site  Redness or swelling around incision site after a week  Increasing nausea or vomiting     Post Anesthesia Home Care Instructions  Activity: Get plenty of rest for the remainder of the day. A responsible individual must stay with you for 24 hours following the procedure.  For the next 24 hours, DO NOT: -Drive a car -Operate machinery -Drink alcoholic beverages -Take any medication unless instructed by your physician -Make any legal decisions or sign important papers.  Meals: Start with liquid foods such as gelatin or soup. Progress to regular foods as tolerated. Avoid greasy, spicy, heavy foods. If nausea and/or vomiting occur, drink only clear liquids until the nausea and/or vomiting subsides. Call your physician if vomiting continues.  Special Instructions/Symptoms: Your throat may feel dry or sore from the anesthesia or the breathing tube placed in your throat during surgery. If this causes discomfort, gargle with warm salt water. The discomfort should disappear within 24 hours.       

## 2022-08-07 NOTE — H&P (Signed)
Chief complaint: Elective sterilization  History of present illness: 26 year old G0 with never desire for childbearing presents for elective sterilization via bilateral salpingectomy.  Patient has been contraception with copper IUD but notes heavy 7-day menses.  Patient is not interested in alternative contraception after many years of consults.  Patient wants to avoid hormones.  Patient is up-to-date on normal Pap smear and negative STD screening.  Patient states her anxiety over unplanned pregnancy is affecting her ability to enter into relationships and intercourse.   Past medical history: Crohn's, bipolar, anxiety, depression, obesity Past surgical history: Laparoscopic cholecystectomy, perirectal fistula with seton placement    Past Medical History:  Diagnosis Date   Anal fistula    chronic   Asthma, exercise induced    Bipolar 2 disorder (HCC)    Crohn disease (HCC)    followed by GI w/ [facility] medical in [facility],  Dr Cloretta Ned ,   dx 06/ 2023   GAD (generalized anxiety disorder)    GERD (gastroesophageal reflux disease)    H/o Lyme disease    per pt dx 2013  now resolved   History of anal fissures    History of cannabis abuse 2017   History of palpitations    evaluated by peds cardiology 12/ 2016 normal ekg and event monitor   Iron deficiency anemia    MDD (major depressive disorder)    Mood disorder (HCC)    OSA (obstructive sleep apnea)    per pt study showed moderate osa ,  s/p  nasal septoplasy 08/ 2020   PTSD (post-traumatic stress disorder) 09/01/2015    Past Surgical History:  Procedure Laterality Date   CHOLECYSTECTOMY N/A 10/12/2021   Procedure: LAPAROSCOPIC CHOLECYSTECTOMY;  Surgeon: Diamantina Monks, MD;  Location: [facility] OR;  Service: General;  Laterality: N/A;   INCISION AND DRAINAGE PERIRECTAL ABSCESS N/A 04/29/2021   Procedure: IRRIGATION AND DEBRIDEMENT PERIRECTAL ABSCESS;  Surgeon: Violeta Gelinas, MD;  Location: [facility] OR;  Service: General;  Laterality: N/A;    LIGATION OF INTERNAL FISTULA TRACT N/A 04/19/2022   Procedure: LIGATION OF INTERNAL FISTULA TRACT;  Surgeon: Romie Levee, MD;  Location: [facility];  Service: General;  Laterality: N/A;   PLACEMENT OF SETON N/A 12/13/2021   Procedure: PLACEMENT OF SETON;  Surgeon: Romie Levee, MD;  Location: [facility];  Service: General;  Laterality: N/A;   RECTAL EXAM UNDER ANESTHESIA N/A 12/13/2021   Procedure: ANORECTAL EXAM UNDER ANESTHESIA;  Surgeon: Romie Levee, MD;  Location: [facility];  Service: General;  Laterality: N/A;   TONSILLECTOMY/ADENOIDECTOMY/TURBINATE REDUCTION Bilateral 04/14/2019   @[facility] ;    AND SEPTOPLASTY   WISDOM TOOTH EXTRACTION  2018    Allergies: Lactose intolerance, oxycodone intolerance, latex with rash  Physical exam: Vitals:   08/07/22 0713  BP: 111/72  Pulse: 94  Resp: 17  Temp: 98.6 F (37 C)  SpO2: 99%   General: Well-appearing, obese, no distress Abdomen: Obese, nontender GU: Deferred to or Lower extremity: Nontender, no edema  CBC    Component Value Date/Time   WBC 18.0 (H) 08/07/2022 0725   RBC 4.76 08/07/2022 0725   HGB 13.5 08/07/2022 0725   HCT 40.5 08/07/2022 0725   HCT CANCELED 01/15/2022 0000   PLT 494 (H) 08/07/2022 0725   MCV 85.1 08/07/2022 0725   MCH 28.4 08/07/2022 0725   MCHC 33.3 08/07/2022 0725   RDW 12.3 08/07/2022 0725   LYMPHSABS 3.9 05/05/2022 1735   MONOABS 0.7 05/05/2022 1735   EOSABS 0.3  05/05/2022 1735   BASOSABS 0.1 05/05/2022 1735   O positive, UPT negative  Assessment and plan: 26 year old G0 with no desire for ever bearing children presents for elective sterilization via bilateral salpingectomy.  Patient is sure of this desire and declines other contraceptives.  We will plan laparoscopic bilateral salpingectomy and IUD removal today risk benefits discussed with patient.  Patient accepts blood transfusion as needed.  Patient understands risk of regret in  approximately 30% given her young age and nulliparity.  Lendon Colonel 08/07/2022 10:22 AM

## 2022-08-07 NOTE — Anesthesia Preprocedure Evaluation (Addendum)
Anesthesia Evaluation  Patient identified by MRN, date of birth, ID band Patient awake    Reviewed: Allergy & Precautions, NPO status , Patient's Chart, lab work & pertinent test results  Airway Mallampati: III  TM Distance: >3 FB Neck ROM: Full    Dental  (+) Teeth Intact, Dental Advisory Given   Pulmonary asthma , sleep apnea    breath sounds clear to auscultation       Cardiovascular negative cardio ROS  Rhythm:Regular Rate:Normal     Neuro/Psych  PSYCHIATRIC DISORDERS Anxiety Depression Bipolar Disorder      GI/Hepatic Neg liver ROS,GERD  Controlled and Medicated,,  Endo/Other  negative endocrine ROS    Renal/GU      Musculoskeletal   Abdominal   Peds  Hematology negative hematology ROS (+)   Anesthesia Other Findings   Reproductive/Obstetrics                             Anesthesia Physical Anesthesia Plan  ASA: 3  Anesthesia Plan: General   Post-op Pain Management: Celebrex PO (pre-op)* and Tylenol PO (pre-op)*   Induction: Intravenous  PONV Risk Score and Plan: 4 or greater and Ondansetron, Dexamethasone, Midazolam and Scopolamine patch - Pre-op  Airway Management Planned: Oral ETT  Additional Equipment: None  Intra-op Plan:   Post-operative Plan: Extubation in OR  Informed Consent: I have reviewed the patients History and Physical, chart, labs and discussed the procedure including the risks, benefits and alternatives for the proposed anesthesia with the patient or authorized representative who has indicated his/her understanding and acceptance.     Dental advisory given  Plan Discussed with: CRNA  Anesthesia Plan Comments:        Anesthesia Quick Evaluation

## 2022-08-07 NOTE — Anesthesia Postprocedure Evaluation (Signed)
Anesthesia Post Note  Patient: Kennadi Albany  Procedure(s) Performed: LAPAROSCOPIC TUBAL LIGATION By Salpingectomy (Bilateral: Abdomen) INTRAUTERINE DEVICE (IUD) REMOVAL (Vagina )     Patient location during evaluation: PACU Anesthesia Type: General Level of consciousness: awake and alert Pain management: pain level controlled Vital Signs Assessment: post-procedure vital signs reviewed and stable Respiratory status: spontaneous breathing, nonlabored ventilation, respiratory function stable and patient connected to nasal cannula oxygen Cardiovascular status: blood pressure returned to baseline and stable Postop Assessment: no apparent nausea or vomiting Anesthetic complications: no   No notable events documented.  Last Vitals:  Vitals:   08/07/22 1245 08/07/22 1325  BP: 108/61 94/61  Pulse: 71 72  Resp: 14 16  Temp: 37 C   SpO2: 99% 96%    Last Pain:  Vitals:   08/07/22 1325  TempSrc:   PainSc: 2                  Shelton Silvas

## 2022-08-07 NOTE — Op Note (Addendum)
08/07/2022  11:57 AM  PATIENT:  Chelsea Hoover  26 y.o. female  PRE-OPERATIVE DIAGNOSIS:  Elective Sterilization  POST-OPERATIVE DIAGNOSIS:  Elective Sterilization  PROCEDURE:  Procedure(s): LAPAROSCOPIC TUBAL LIGATION By Salpingectomy (Bilateral) INTRAUTERINE DEVICE (IUD) REMOVAL  SURGEON:  Surgeon(s) and Role:    * Noland Fordyce, MD - Primary  PHYSICIAN ASSISTANT:   ASSISTANTS: Reina Fuse, CNM  ANESTHESIA:   local and general  EBL:  25 mL   BLOOD ADMINISTERED:none  DRAINS: Urinary Catheter (Foley)   LOCAL MEDICATIONS USED:  MARCAINE     SPECIMEN:  Source of Specimen:  Bilateral fallopian tubes  DISPOSITION OF SPECIMEN:  PATHOLOGY  COUNTS:  YES  TOURNIQUET:  * No tourniquets in log *  DICTATION: .Note written in EPIC  PLAN OF CARE: Discharge to home after PACU  PATIENT DISPOSITION:  PACU - hemodynamically stable.   Delay start of Pharmacological VTE agent (>24hrs) due to surgical blood loss or risk of bleeding: yes   Antibiotics: None Complications: None  INDICATIONS: 26 year old G0 with desire for permanent sterilization.  Patient understands all surgical risks , risk of regret, and understands all alternatives to permanent contraception. Risks of procedure discussed with patient including bleeding, infection, damage to surrounding organs.     FINDINGS: Normal bilateral tubes and ovaries. Normal uterus.absence of significant adhesions. normal liver edge and absent gallbladder.  TECHNIQUE:  The patient was taken to the operating room where general anesthesia was obtained without difficulty.  She was then placed in the dorsal lithotomy position and prepared and draped in sterile fashion.  After an adequate timeout was performed, a foley catheter was placed and  a bimanual exam was done to assess the size and position of the uterus. A bivalved speculum was then placed in the patient's vagina, and the anterior lip of cervix grasped with the single-tooth  tenaculum.  The uterus was sounded then dilated to a #17 Pratt.  A ZUMI uterine manipulator was placed into the uterus.    Attention was then turned to the patient's abdomen where a 5 mm skin incision was made on the umbilical fold after first injecting with quarter percent Marcaine.  Using blunt dissection with Kocher clamps and reflects the fascia was identified.  Kocher clamps was placed on the fascia which was elevated up.  Mayo scissors was used to obtain entry into the peritoneum.  Entry was confirmed with the use of the laparoscopic pneumoperitoneum was created.  A survey of the patient's pelvis and abdomen revealed findings as above.  5 mm skin incisions were made in the left and right lower quadrant after injecting with anasthetic. Non-bladed trocars were placed under direct visualization. Using blunt and grasping instruments, anatomy was surveyed with findings as above.  Bilateral ureters were identified in their course was left.  With placing the uterus on the stretch to the patient's right.  The left fallopian tube was grasped at its fimbriated end and elevated up with a bland grasper.  Using a Gyrus device  for serial bipolar cautery the mesosalpynx was cauterized and transected just under the fallopian tube with care to stay clear of the IP ligament.  When we approached the proximal edge of the fallopian tube at the cornua the Gyrus device was used to transect the tube.  The tube was free and removed through a lateral port.  An identical procedure was carried out on the right side.  Hemostasis was noted.  The remainder of the quarter percent Marcaine, approximately 20 cc was placed along the  cut edges.     Pneumoperitoneum removed, sites still hemostatic. Instruments and ports removed.   Umbilical fascial incision was closed with 0 vicryl in purse string suture.  4-0 vicryl used for skin incisions. Dermabond applied by nurse.   Uterine manipulator was removed.  Repeat speculum exam  revealed hemostasis of the tenaculum site.    The patient tolerated the procedure well.  Sponge, lap, and needle counts were correct times two.  The patient was then taken to the recovery room awake, extubated and  in stable condition.  Lendon Colonel 08/07/2022 11:59 AM    Addendum: after the sterile speculum was placed in the vagina a ring forcep was used to grasp the IUD string and the IUD was removed. The uterus was then sounded and the procedure continued according to the note above.  Lendon Colonel 08/14/2022 9:36 PM

## 2022-08-07 NOTE — Transfer of Care (Signed)
Immediate Anesthesia Transfer of Care Note  Patient: Chelsea Hoover  Procedure(s) Performed: Procedure(s) (LRB): LAPAROSCOPIC TUBAL LIGATION By Salpingectomy (Bilateral) INTRAUTERINE DEVICE (IUD) REMOVAL  Patient Location: PACU  Anesthesia Type: General  Level of Consciousness: awake, sedated, patient cooperative and responds to stimulation  Airway & Oxygen Therapy: Patient Spontanous Breathing and Patient connected to [place] oxygen  Post-op Assessment: Report given to PACU RN, Post -op Vital signs reviewed and stable and Patient moving all extremities  Post vital signs: Reviewed and stable  Complications: No apparent anesthesia complications

## 2022-08-08 ENCOUNTER — Encounter (HOSPITAL_BASED_OUTPATIENT_CLINIC_OR_DEPARTMENT_OTHER): Payer: Self-pay | Admitting: Obstetrics

## 2022-08-08 LAB — SURGICAL PATHOLOGY

## 2022-08-14 ENCOUNTER — Encounter (HOSPITAL_BASED_OUTPATIENT_CLINIC_OR_DEPARTMENT_OTHER): Payer: Self-pay

## 2022-08-14 ENCOUNTER — Other Ambulatory Visit: Payer: Self-pay

## 2022-08-14 ENCOUNTER — Emergency Department (HOSPITAL_BASED_OUTPATIENT_CLINIC_OR_DEPARTMENT_OTHER): Payer: Commercial Managed Care - HMO

## 2022-08-14 ENCOUNTER — Emergency Department (HOSPITAL_BASED_OUTPATIENT_CLINIC_OR_DEPARTMENT_OTHER)
Admission: EM | Admit: 2022-08-14 | Discharge: 2022-08-14 | Disposition: A | Discharge status: Home / Self Care | Payer: Commercial Managed Care - HMO | Attending: Emergency Medicine | Admitting: Emergency Medicine

## 2022-08-14 DIAGNOSIS — K50913 Crohn's disease, unspecified, with fistula: Secondary | ICD-10-CM | POA: Insufficient documentation

## 2022-08-14 DIAGNOSIS — Z91040 Latex allergy status: Secondary | ICD-10-CM | POA: Insufficient documentation

## 2022-08-14 LAB — CBC WITH DIFFERENTIAL/PLATELET
Abs Immature Granulocytes: 0.06 10*3/uL (ref 0.00–0.07)
Basophils Absolute: 0.1 10*3/uL (ref 0.0–0.1)
Basophils Relative: 0 %
Eosinophils Absolute: 0.2 10*3/uL (ref 0.0–0.5)
Eosinophils Relative: 1 %
HCT: 38.2 % (ref 36.0–46.0)
Hemoglobin: 12.7 g/dL (ref 12.0–15.0)
Immature Granulocytes: 0 %
Lymphocytes Relative: 13 %
Lymphs Abs: 2.3 10*3/uL (ref 0.7–4.0)
MCH: 28.2 pg (ref 26.0–34.0)
MCHC: 33.2 g/dL (ref 30.0–36.0)
MCV: 84.9 fL (ref 80.0–100.0)
Monocytes Absolute: 1 10*3/uL (ref 0.1–1.0)
Monocytes Relative: 6 %
Neutro Abs: 13.7 10*3/uL — ABNORMAL HIGH (ref 1.7–7.7)
Neutrophils Relative %: 80 %
Platelets: 358 10*3/uL (ref 150–400)
RBC: 4.5 MIL/uL (ref 3.87–5.11)
RDW: 12.5 % (ref 11.5–15.5)
WBC: 17.3 10*3/uL — ABNORMAL HIGH (ref 4.0–10.5)
nRBC: 0 % (ref 0.0–0.2)

## 2022-08-14 LAB — BASIC METABOLIC PANEL
Anion gap: 10 (ref 5–15)
BUN: 11 mg/dL (ref 6–20)
CO2: 25 mmol/L (ref 22–32)
Calcium: 9.5 mg/dL (ref 8.9–10.3)
Chloride: 100 mmol/L (ref 98–111)
Creatinine, Ser: 0.9 mg/dL (ref 0.44–1.00)
GFR, Estimated: >60 mL/min (ref >60)
Glucose, Bld: 89 mg/dL (ref 70–99)
Potassium: 4 mmol/L (ref 3.5–5.1)
Sodium: 135 mmol/L (ref 135–145)

## 2022-08-14 LAB — HCG, SERUM, QUALITATIVE: Preg, Serum: NEGATIVE

## 2022-08-14 MED ORDER — IOHEXOL 300 MG/ML  SOLN
100.0000 mL | Freq: Once | INTRAMUSCULAR | Status: AC | PRN
  Administered 2022-08-14: 85 mL via INTRAVENOUS

## 2022-08-14 MED ORDER — METHYLPREDNISOLONE SODIUM SUCC 40 MG IJ SOLR
40.0000 mg | Freq: Once | INTRAMUSCULAR | Status: AC
  Administered 2022-08-14: 40 mg via INTRAVENOUS
  Filled 2022-08-14: qty 1

## 2022-08-14 MED ORDER — DIPHENHYDRAMINE HCL 25 MG PO CAPS: 50.0000 mg | ORAL_CAPSULE | Freq: Once | ORAL | Status: AC

## 2022-08-14 MED ORDER — SODIUM CHLORIDE 0.9 % IV BOLUS
1000.0000 mL | Freq: Once | INTRAVENOUS | Status: AC
  Administered 2022-08-14: 1000 mL via INTRAVENOUS

## 2022-08-14 MED ORDER — DOCUSATE SODIUM 100 MG PO CAPS: 100.0000 mg | ORAL_CAPSULE | Freq: Two times a day (BID) | ORAL | 0 refills | Status: AC

## 2022-08-14 MED ORDER — AMOXICILLIN-POT CLAVULANATE 875-125 MG PO TABS
1.0000 | ORAL_TABLET | Freq: Once | ORAL | Status: AC
  Administered 2022-08-14: 1 via ORAL
  Filled 2022-08-14: qty 1

## 2022-08-14 MED ORDER — DIPHENHYDRAMINE HCL 50 MG/ML IJ SOLN
50.0000 mg | Freq: Once | INTRAMUSCULAR | Status: AC
  Administered 2022-08-14: 50 mg via INTRAVENOUS
  Filled 2022-08-14: qty 1

## 2022-08-14 MED ORDER — AMOXICILLIN-POT CLAVULANATE 875-125 MG PO TABS: 1.0000 | ORAL_TABLET | Freq: Two times a day (BID) | ORAL | 0 refills | Status: DC

## 2022-08-14 NOTE — ED Notes (Signed)
Late entry -- This nurse to bedside with Dr. Judd Lien -- moderate amount of tan thick drainage noted between gluteal folds; when area cleansed with 4x4; tab drainage noted appears to be coming from wound vs abscess to L medial gluteal fold with scant amount of blood.  No visible blood noted at anus.

## 2022-08-14 NOTE — ED Provider Notes (Addendum)
[facility] EMERGENCY DEPT Provider Note   CSN: [id number] Arrival date & time: 08/14/22  9767     History  Chief Complaint  Patient presents with   Personal Problem    Chelsea Hoover is a 26 y.o. female.  Patient is a 26 year old female with past medical history of Crohn's disease.  She underwent surgery for an anal fistula in August with Dr. Maisie Fus.  She was again hospitalized in September over concerns of a possible perianal abscess.  Patient has been having issues with this intermittently since August.  Most recently, she had a tubal ligation performed and was given pain medication postoperatively.  She has been passing harder stools because of this and is now having worsening pain in her rectum along with drainage.  She also reports intermittent bloody stools.  She denies any fevers or chills.  The history is provided by the patient.       Home Medications Prior to Admission medications   Medication Sig Start Date End Date Taking? Authorizing Provider  acetaminophen (TYLENOL) 500 MG tablet Take 1,000 mg by mouth every 6 (six) hours as needed (pain).    [provider]  Adalimumab (HUMIRA) 40 MG/0.4ML PSKT Inject 0.4 mg into the skin every 14 (fourteen) days.    [provider]  albuterol (VENTOLIN HFA) 108 (90 Base) MCG/ACT inhaler Inhale 1 puff into the lungs as needed for shortness of breath.    [provider]  buPROPion (WELLBUTRIN XL) 150 MG 24 hr tablet Take 150 mg by mouth daily.    [provider]  Esketamine HCl, 84 MG Dose, (SPRAVATO, 84 MG DOSE,) 28 MG/DEVICE SOPK Place 1 spray into the nose See admin instructions. Instill 1 spray into each nostril Monday, Friday    [provider]  famotidine (PEPCID) 40 MG tablet Take 40 mg by mouth at bedtime. 07/24/21   [provider]  ferrous sulfate 324 MG TBEC Take 324 mg by mouth daily with breakfast.    [provider]  HYDROcodone-acetaminophen  (NORCO/VICODIN) 5-325 MG tablet Take 2 tablets by mouth every 6 (six) hours as needed for moderate pain. 08/07/22   Noland Fordyce, MD  hydrOXYzine (ATARAX/VISTARIL) 25 MG tablet Take 25 mg by mouth daily as needed for anxiety. 03/22/21   [provider]  ibuprofen (ADVIL) 800 MG tablet Take 1 tablet (800 mg total) by mouth every 8 (eight) hours as needed for moderate pain. 08/07/22   Noland Fordyce, MD  lamoTRIgine (LAMICTAL) 100 MG tablet Take 1 tablet (100 mg total) by mouth 2 (two) times daily. 11/29/19   Aldean Baker, NP  MAGNESIUM PO Take 1 tablet by mouth daily.    [provider]  Multiple Minerals-Vitamins (CALCIUM CITRATE +) TABS Take 2 tablets by mouth at bedtime.    [provider]  Multiple Vitamin (MULTIVITAMIN WITH MINERALS) TABS tablet Take 1 tablet by mouth daily.    [provider]  PARAGARD INTRAUTERINE COPPER IU by Intrauterine route once.    [provider]  traMADol (ULTRAM) 50 MG tablet Take 1-2 tablets (50-100 mg total) by mouth every 6 (six) hours as needed (pain). 05/06/22   Romie Levee, MD  traZODone (DESYREL) 150 MG tablet Take 0.5 tablets (75 mg total) by mouth at bedtime. 11/29/19   Aldean Baker, NP      Allergies    Roxicodone [oxycodone], Lactose intolerance (gi), and Latex    Review of Systems   Review of Systems  All other systems reviewed  and are negative.   Physical Exam Updated Vital Signs BP 119/76 (BP Location: Right Arm)   Temp 98.3 F (36.8 C) (Oral)   Resp 18   Ht 5\' 1"  (1.549 m)   Wt 97.1 kg   LMP 08/07/2022 (Exact Date)   SpO2 99%   BMI 40.43 kg/m  Physical Exam Vitals and nursing note reviewed.  Constitutional:      General: She is not in acute distress.    Appearance: She is well-developed. She is not diaphoretic.  HENT:     Head: Normocephalic and atraumatic.  Cardiovascular:     Rate and Rhythm: Normal rate and regular rhythm.     Heart sounds: No murmur heard.    No friction rub.  No gallop.  Pulmonary:     Effort: Pulmonary effort is normal. No respiratory distress.     Breath sounds: Normal breath sounds. No wheezing.  Abdominal:     General: Bowel sounds are normal. There is no distension.     Palpations: Abdomen is soft.     Tenderness: There is no abdominal tenderness.  Genitourinary:    Comments: To the perianal area, there is a purulent discharge noted.  This appears to be coming from the perianal tissue of the left buttock.  There is tenderness in this area, but no significant redness or warmth. Musculoskeletal:        General: Normal range of motion.     Cervical back: Normal range of motion and neck supple.  Skin:    General: Skin is warm and dry.  Neurological:     General: No focal deficit present.     Mental Status: She is alert and oriented to person, place, and time.     ED Results / Procedures / Treatments   Labs (all labs ordered are listed, but only abnormal results are displayed) Labs Reviewed  BASIC METABOLIC PANEL  CBC WITH DIFFERENTIAL/PLATELET  HCG, SERUM, QUALITATIVE    EKG None  Radiology No results found.  Procedures Procedures    Medications Ordered in ED Medications  sodium chloride 0.9 % bolus 1,000 mL (has no administration in time range)    ED Course/ Medical Decision Making/ A&P  Patient is a 26 year old female with history of Crohn's disease status post surgery to repair an anal fistula in August.  She developed infection and possible abscess in September and was seen again for this.  Patient presenting today with complaints of pain in her rectal area and also notices occasional bloody stools.  Patient arrives here with stable vital signs and is in no acute distress.  Physical examination is unremarkable with the exception of fullness of the perianal tissue of the left buttock along with purulent discharge that appears to be coming from the perianal skin.  Laboratory studies show a white count of 17,000, so a  CT scan of the pelvis has been ordered to further evaluate.  My concerns are for a possible perianal or perirectal abscess.  Care will be signed out to Dr. October at shift change.  He will obtain the results of the CT scan and determine the final disposition.  She does have a contrast dye allergy documented so we will have to go through the dye allergy prep.  Final Clinical Impression(s) / ED Diagnoses Final diagnoses:  None    Rx / DC Orders ED Discharge Orders     None         Criss Alvine, MD 08/14/22 08/16/22    Theophil Thivierge,  Riley Lam, MD 08/14/22 [phone]

## 2022-08-14 NOTE — ED Notes (Signed)
Pt now reporting diff breathing when IV Contrast administered for prior CT scan -- Dr.Delo notified via secure chat

## 2022-08-14 NOTE — Discharge Instructions (Addendum)
If you develop fever, new or worsening pain, or any other new/concerning symptoms then return to the ER for evaluation. 

## 2022-08-14 NOTE — ED Provider Notes (Signed)
Care transferred to me. CT does not show an obvious abscess. On my exam (with chaperone) there is a small wound to the left buttocks a few centimeters away from the rectum.  There is no fluctuance/induration.  There is a small amount of purulence that is able to be expressed out of this.  I discussed with general surgery regarding her CT results. Based on their recommendations, will have outpatient follow up closely with her surgeon, Dr Maisie Fus, and will put on Augmentin.  Will also recommend stool softeners in addition to her Dulcolax.  At this point, she has pain control at home and otherwise appears stable.  She has normal vitals.  I do not think she needs IV antibiotics.  Will discharge home with return precautions.   Pricilla Loveless, MD 08/14/22 1319

## 2022-08-14 NOTE — ED Triage Notes (Signed)
Peri-rectal fistula repair on 04/2022, since then healing has been slow.  Patient now complains with peri-rectal pain with some bleeding with bowel movements.

## 2022-08-19 ENCOUNTER — Ambulatory Visit: Payer: Self-pay | Admitting: General Surgery

## 2022-08-19 NOTE — H&P (View-Only) (Signed)
Chelsea Hoover is a 26 y.o. female who underwent I&D of perirectal abscess in August 2022.  Since that time, she has had continued drainage from that area.  She subsequently underwent a laparoscopic cholecystectomy by Dr. Bedelia Person and mentioned to her that she was still having drainage in this area.  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  Pt with well controlled Crohn's on Humira.  She underwent an exam under anesthesia with seton placement on December 12, 2021.  She underwent a lift procedure on April 19, 2022.  She has had trouble with persistent drainage since then   Past Medical History:  Diagnosis Date   Anal fistula    chronic   Asthma, exercise induced    Bipolar 2 disorder (HCC)    Crohn disease (HCC)    followed by GI w/ [facility] in [facility],  Dr Cloretta Ned ,   dx 06/ 2023   GAD (generalized anxiety disorder)    GERD (gastroesophageal reflux disease)    H/o Lyme disease    per pt dx 2013  now resolved   History of anal fissures    History of cannabis abuse 2017   History of palpitations    evaluated by peds cardiology 12/ 2016 normal ekg and event monitor   Iron deficiency anemia    MDD (major depressive disorder)    Mood disorder (HCC)    OSA (obstructive sleep apnea)    per pt study showed moderate osa ,  s/p  nasal septoplasy 08/ 2020   PTSD (post-traumatic stress disorder) 09/01/2015   Past Surgical History:  Procedure Laterality Date   CHOLECYSTECTOMY N/A 10/12/2021   Procedure: LAPAROSCOPIC CHOLECYSTECTOMY;  Surgeon: Diamantina Monks, MD;  Location: [facility] OR;  Service: General;  Laterality: N/A;   INCISION AND DRAINAGE PERIRECTAL ABSCESS N/A 04/29/2021   Procedure: IRRIGATION AND DEBRIDEMENT PERIRECTAL ABSCESS;  Surgeon: Violeta Gelinas, MD;  Location: [facility] OR;  Service: General;  Laterality: N/A;   IUD REMOVAL  08/07/2022   Procedure: INTRAUTERINE DEVICE (IUD) REMOVAL;  Surgeon: Noland Fordyce, MD;  Location: [facility];   Service: Gynecology;;   LAPAROSCOPIC TUBAL LIGATION Bilateral 08/07/2022   Procedure: LAPAROSCOPIC TUBAL LIGATION By Salpingectomy;  Surgeon: Noland Fordyce, MD;  Location: [facility];  Service: Gynecology;  Laterality: Bilateral;   LIGATION OF INTERNAL FISTULA TRACT N/A 04/19/2022   Procedure: LIGATION OF INTERNAL FISTULA TRACT;  Surgeon: Romie Levee, MD;  Location: [facility];  Service: General;  Laterality: N/A;   PLACEMENT OF SETON N/A 12/13/2021   Procedure: PLACEMENT OF SETON;  Surgeon: Romie Levee, MD;  Location: [facility];  Service: General;  Laterality: N/A;   RECTAL EXAM UNDER ANESTHESIA N/A 12/13/2021   Procedure: ANORECTAL EXAM UNDER ANESTHESIA;  Surgeon: Romie Levee, MD;  Location: [facility];  Service: General;  Laterality: N/A;   TONSILLECTOMY/ADENOIDECTOMY/TURBINATE REDUCTION Bilateral 04/14/2019   @[facility] ;    AND SEPTOPLASTY   WISDOM TOOTH EXTRACTION  2018   Social History   Socioeconomic History   Marital status: Single    Spouse name: Not on file   Number of children: 0   Years of education: Not on file   Highest education level: Not on file  Occupational History   Not on file  Tobacco Use   Smoking status: Never   Smokeless tobacco: Never  Vaping Use   Vaping Use: Never used  Substance and Sexual Activity   Alcohol use: Not Currently  Comment: rare   Drug use: Not Currently   Sexual activity: Yes    Birth control/protection: I.U.D.  Other Topics Concern   Not on file  Social History Narrative   Left handed   Lives with mother and sisters   Two story   Caffeine 1 or 2 daily   Social Determinants of Health   Financial Resource Strain: Not on file  Food Insecurity: Not on file  Transportation Needs: Not on file  Physical Activity: Not on file  Stress: Not on file  Social Connections: Not on file  Intimate Partner Violence: Not on file   Family History  Problem Relation Age of  Onset   Crohn's disease Father    Bipolar disorder Maternal Aunt    Schizophrenia Maternal Grandfather    Bipolar disorder Maternal Grandfather     Current Outpatient Medications:    acetaminophen (TYLENOL) 500 MG tablet, Take 1,000 mg by mouth every 6 (six) hours as needed (pain)., Disp: , Rfl:    Adalimumab (HUMIRA) 40 MG/0.4ML PSKT, Inject 0.4 mg into the skin every 14 (fourteen) days., Disp: , Rfl:    albuterol (VENTOLIN HFA) 108 (90 Base) MCG/ACT inhaler, Inhale 1 puff into the lungs as needed for shortness of breath., Disp: , Rfl:    amoxicillin-clavulanate (AUGMENTIN) 875-125 MG tablet, Take 1 tablet by mouth every 12 (twelve) hours., Disp: 13 tablet, Rfl: 0   buPROPion (WELLBUTRIN XL) 150 MG 24 hr tablet, Take 150 mg by mouth daily., Disp: , Rfl:    docusate sodium (COLACE) 100 MG capsule, Take 1 capsule (100 mg total) by mouth every 12 (twelve) hours., Disp: 60 capsule, Rfl: 0   Esketamine HCl, 84 MG Dose, (SPRAVATO, 84 MG DOSE,) 28 MG/DEVICE SOPK, Place 1 spray into the nose See admin instructions. Instill 1 spray into each nostril Monday, Friday, Disp: , Rfl:    famotidine (PEPCID) 40 MG tablet, Take 40 mg by mouth at bedtime., Disp: , Rfl:    ferrous sulfate 324 MG TBEC, Take 324 mg by mouth daily with breakfast., Disp: , Rfl:    HYDROcodone-acetaminophen (NORCO/VICODIN) 5-325 MG tablet, Take 2 tablets by mouth every 6 (six) hours as needed for moderate pain., Disp: 20 tablet, Rfl: 0   hydrOXYzine (ATARAX/VISTARIL) 25 MG tablet, Take 25 mg by mouth daily as needed for anxiety., Disp: , Rfl:    ibuprofen (ADVIL) 800 MG tablet, Take 1 tablet (800 mg total) by mouth every 8 (eight) hours as needed for moderate pain., Disp: 60 tablet, Rfl: 1   lamoTRIgine (LAMICTAL) 100 MG tablet, Take 1 tablet (100 mg total) by mouth 2 (two) times daily., Disp: 60 tablet, Rfl: 0   MAGNESIUM PO, Take 1 tablet by mouth daily., Disp: , Rfl:    Multiple Minerals-Vitamins (CALCIUM CITRATE +) TABS, Take 2  tablets by mouth at bedtime., Disp: , Rfl:    Multiple Vitamin (MULTIVITAMIN WITH MINERALS) TABS tablet, Take 1 tablet by mouth daily., Disp: , Rfl:    PARAGARD INTRAUTERINE COPPER IU, by Intrauterine route once., Disp: , Rfl:    traMADol (ULTRAM) 50 MG tablet, Take 1-2 tablets (50-100 mg total) by mouth every 6 (six) hours as needed (pain)., Disp: 30 tablet, Rfl: 0   traZODone (DESYREL) 150 MG tablet, Take 0.5 tablets (75 mg total) by mouth at bedtime., Disp: 15 tablet, Rfl: 0 Allergies  Allergen Reactions   Ivp Dye [Iodinated Contrast Media]     "Hard to breathe"   Roxicodone [Oxycodone] Nausea And Vomiting   Lactose   Intolerance (Gi) Diarrhea and Other (See Comments)    Stomach pain   Latex Rash   Review of Systems  All other systems reviewed and are negative.     Physical Examination:    Gen: NAD Abd: soft Incision: clean, ext opening with min amount of purulence, tender to palpation   Assessment and Plan:    Chelsea Hoover is a 26 y.o. female who underwent LIFT.   There are no diagnoses linked to this encounter.     26-year-old with anal fistula.  It does not appear that she is healing and has developed a recurrent fistula.  I have discussed this with her gastroenterologist and with the patient.  I recommend placing a seton to help with drainage of infection.  Once this is in place, she can be placed on further immunosuppressive medications at the discretion of her gastroenterologist.  We will then remove the seton once her inflammation is cleared.   No follow-ups on file.     The plan was discussed in detail with the patient today, who expressed understanding.  The patient has my contact information, and understands to call me with any additional questions or concerns in the interval.  I would be happy to see the patient back sooner if the need arises.    Chelsea Renegar C Jd Mccaster, MD Colon and Rectal Surgery [facility]  

## 2022-08-19 NOTE — H&P (Signed)
Chelsea Hoover is a 26 y.o. female who underwent I&D of perirectal abscess in August 2022.  Since that time, she has had continued drainage from that area.  She subsequently underwent a laparoscopic cholecystectomy by Dr. Bedelia Person and mentioned to her that she was still having drainage in this area.  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  Pt with well controlled Crohn's on Humira.  She underwent an exam under anesthesia with seton placement on December 12, 2021.  She underwent a lift procedure on April 19, 2022.  She has had trouble with persistent drainage since then   Past Medical History:  Diagnosis Date   Anal fistula    chronic   Asthma, exercise induced    Bipolar 2 disorder (HCC)    Crohn disease (HCC)    followed by GI w/ [facility] medical in [facility],  Dr Cloretta Ned ,   dx 06/ 2023   GAD (generalized anxiety disorder)    GERD (gastroesophageal reflux disease)    H/o Lyme disease    per pt dx 2013  now resolved   History of anal fissures    History of cannabis abuse 2017   History of palpitations    evaluated by peds cardiology 12/ 2016 normal ekg and event monitor   Iron deficiency anemia    MDD (major depressive disorder)    Mood disorder (HCC)    OSA (obstructive sleep apnea)    per pt study showed moderate osa ,  s/p  nasal septoplasy 08/ 2020   PTSD (post-traumatic stress disorder) 09/01/2015   Past Surgical History:  Procedure Laterality Date   CHOLECYSTECTOMY N/A 10/12/2021   Procedure: LAPAROSCOPIC CHOLECYSTECTOMY;  Surgeon: Diamantina Monks, MD;  Location: [facility] OR;  Service: General;  Laterality: N/A;   INCISION AND DRAINAGE PERIRECTAL ABSCESS N/A 04/29/2021   Procedure: IRRIGATION AND DEBRIDEMENT PERIRECTAL ABSCESS;  Surgeon: Violeta Gelinas, MD;  Location: [facility] OR;  Service: General;  Laterality: N/A;   IUD REMOVAL  08/07/2022   Procedure: INTRAUTERINE DEVICE (IUD) REMOVAL;  Surgeon: Noland Fordyce, MD;  Location: [facility];   Service: Gynecology;;   LAPAROSCOPIC TUBAL LIGATION Bilateral 08/07/2022   Procedure: LAPAROSCOPIC TUBAL LIGATION By Salpingectomy;  Surgeon: Noland Fordyce, MD;  Location: [facility];  Service: Gynecology;  Laterality: Bilateral;   LIGATION OF INTERNAL FISTULA TRACT N/A 04/19/2022   Procedure: LIGATION OF INTERNAL FISTULA TRACT;  Surgeon: Romie Levee, MD;  Location: [facility];  Service: General;  Laterality: N/A;   PLACEMENT OF SETON N/A 12/13/2021   Procedure: PLACEMENT OF SETON;  Surgeon: Romie Levee, MD;  Location: [facility];  Service: General;  Laterality: N/A;   RECTAL EXAM UNDER ANESTHESIA N/A 12/13/2021   Procedure: ANORECTAL EXAM UNDER ANESTHESIA;  Surgeon: Romie Levee, MD;  Location: [facility];  Service: General;  Laterality: N/A;   TONSILLECTOMY/ADENOIDECTOMY/TURBINATE REDUCTION Bilateral 04/14/2019   @[facility] ;    AND SEPTOPLASTY   WISDOM TOOTH EXTRACTION  2018   Social History   Socioeconomic History   Marital status: Single    Spouse name: Not on file   Number of children: 0   Years of education: Not on file   Highest education level: Not on file  Occupational History   Not on file  Tobacco Use   Smoking status: Never   Smokeless tobacco: Never  Vaping Use   Vaping Use: Never used  Substance and Sexual Activity   Alcohol use: Not Currently  Comment: rare   Drug use: Not Currently   Sexual activity: Yes    Birth control/protection: I.U.D.  Other Topics Concern   Not on file  Social History Narrative   Left handed   Lives with mother and sisters   Two story   Caffeine 1 or 2 daily   Social Determinants of Health   Financial Resource Strain: Not on file  Food Insecurity: Not on file  Transportation Needs: Not on file  Physical Activity: Not on file  Stress: Not on file  Social Connections: Not on file  Intimate Partner Violence: Not on file   Family History  Problem Relation Age of  Onset   Crohn's disease Father    Bipolar disorder Maternal Aunt    Schizophrenia Maternal Grandfather    Bipolar disorder Maternal Grandfather     Current Outpatient Medications:    acetaminophen (TYLENOL) 500 MG tablet, Take 1,000 mg by mouth every 6 (six) hours as needed (pain)., Disp: , Rfl:    Adalimumab (HUMIRA) 40 MG/0.4ML PSKT, Inject 0.4 mg into the skin every 14 (fourteen) days., Disp: , Rfl:    albuterol (VENTOLIN HFA) 108 (90 Base) MCG/ACT inhaler, Inhale 1 puff into the lungs as needed for shortness of breath., Disp: , Rfl:    amoxicillin-clavulanate (AUGMENTIN) 875-125 MG tablet, Take 1 tablet by mouth every 12 (twelve) hours., Disp: 13 tablet, Rfl: 0   buPROPion (WELLBUTRIN XL) 150 MG 24 hr tablet, Take 150 mg by mouth daily., Disp: , Rfl:    docusate sodium (COLACE) 100 MG capsule, Take 1 capsule (100 mg total) by mouth every 12 (twelve) hours., Disp: 60 capsule, Rfl: 0   Esketamine HCl, 84 MG Dose, (SPRAVATO, 84 MG DOSE,) 28 MG/DEVICE SOPK, Place 1 spray into the nose See admin instructions. Instill 1 spray into each nostril Monday, Friday, Disp: , Rfl:    famotidine (PEPCID) 40 MG tablet, Take 40 mg by mouth at bedtime., Disp: , Rfl:    ferrous sulfate 324 MG TBEC, Take 324 mg by mouth daily with breakfast., Disp: , Rfl:    HYDROcodone-acetaminophen (NORCO/VICODIN) 5-325 MG tablet, Take 2 tablets by mouth every 6 (six) hours as needed for moderate pain., Disp: 20 tablet, Rfl: 0   hydrOXYzine (ATARAX/VISTARIL) 25 MG tablet, Take 25 mg by mouth daily as needed for anxiety., Disp: , Rfl:    ibuprofen (ADVIL) 800 MG tablet, Take 1 tablet (800 mg total) by mouth every 8 (eight) hours as needed for moderate pain., Disp: 60 tablet, Rfl: 1   lamoTRIgine (LAMICTAL) 100 MG tablet, Take 1 tablet (100 mg total) by mouth 2 (two) times daily., Disp: 60 tablet, Rfl: 0   MAGNESIUM PO, Take 1 tablet by mouth daily., Disp: , Rfl:    Multiple Minerals-Vitamins (CALCIUM CITRATE +) TABS, Take 2  tablets by mouth at bedtime., Disp: , Rfl:    Multiple Vitamin (MULTIVITAMIN WITH MINERALS) TABS tablet, Take 1 tablet by mouth daily., Disp: , Rfl:    PARAGARD INTRAUTERINE COPPER IU, by Intrauterine route once., Disp: , Rfl:    traMADol (ULTRAM) 50 MG tablet, Take 1-2 tablets (50-100 mg total) by mouth every 6 (six) hours as needed (pain)., Disp: 30 tablet, Rfl: 0   traZODone (DESYREL) 150 MG tablet, Take 0.5 tablets (75 mg total) by mouth at bedtime., Disp: 15 tablet, Rfl: 0 Allergies  Allergen Reactions   Ivp Dye [Iodinated Contrast Media]     "Hard to breathe"   Roxicodone [Oxycodone] Nausea And Vomiting   Lactose  Intolerance (Gi) Diarrhea and Other (See Comments)    Stomach pain   Latex Rash   Review of Systems  All other systems reviewed and are negative.     Physical Examination:    Gen: NAD Abd: soft Incision: clean, ext opening with min amount of purulence, tender to palpation   Assessment and Plan:    Chelsea Hoover is a 26 y.o. female who underwent LIFT.   There are no diagnoses linked to this encounter.     26 year old with anal fistula.  It does not appear that she is healing and has developed a recurrent fistula.  I have discussed this with her gastroenterologist and with the patient.  I recommend placing a seton to help with drainage of infection.  Once this is in place, she can be placed on further immunosuppressive medications at the discretion of her gastroenterologist.  We will then remove the seton once her inflammation is cleared.   No follow-ups on file.     The plan was discussed in detail with the patient today, who expressed understanding.  The patient has my contact information, and understands to call me with any additional questions or concerns in the interval.  I would be happy to see the patient back sooner if the need arises.    Rosario Adie, MD Colon and Rectal Surgery [facility]

## 2022-09-06 ENCOUNTER — Encounter (HOSPITAL_BASED_OUTPATIENT_CLINIC_OR_DEPARTMENT_OTHER): Payer: Self-pay | Admitting: General Surgery

## 2022-09-06 NOTE — Progress Notes (Signed)
Spoke w/ via phone for pre-op interview--- patient  Lab needs dos---- CXR, EKG, urine pregnancy              Lab results------ COVID test -----patient states asymptomatic no test needed Arrive at ------- 1130 on 09/11/22 NPO after MN NO Solid Food.  Clear liquids from MN until--- 1030 on 09/11/22 Med rec completed Medications to take morning of surgery -----albuterol inhaler, lamictal, wellbutrin  Diabetic medication ----- n/a Patient instructed no nail polish to be worn day of surgery Patient instructed to bring photo id and insurance card day of surgery Patient aware to have Driver (ride ) / caregiver    for 24 hours after surgery - Forest Gleason - mom [phone] Patient Special Instructions ----- none Pre-Op special Istructions ----- none Patient verbalized understanding of instructions that were given at this phone interview. Patient denies shortness of breath, chest pain, fever, cough at this phone interview.  Lyndel Pleasure, RN

## 2022-09-11 ENCOUNTER — Ambulatory Visit (HOSPITAL_BASED_OUTPATIENT_CLINIC_OR_DEPARTMENT_OTHER)
Admission: RE | Admit: 2022-09-11 | Discharge: 2022-09-11 | Disposition: A | Discharge status: Home / Self Care | Payer: Commercial Managed Care - HMO | Attending: General Surgery | Admitting: General Surgery

## 2022-09-11 ENCOUNTER — Encounter (HOSPITAL_BASED_OUTPATIENT_CLINIC_OR_DEPARTMENT_OTHER)
Admission: RE | Disposition: A | Discharge status: Home / Self Care | Payer: Self-pay | Source: Home / Self Care | Attending: General Surgery

## 2022-09-11 ENCOUNTER — Encounter (HOSPITAL_BASED_OUTPATIENT_CLINIC_OR_DEPARTMENT_OTHER): Payer: Self-pay | Admitting: General Surgery

## 2022-09-11 ENCOUNTER — Other Ambulatory Visit: Payer: Self-pay

## 2022-09-11 ENCOUNTER — Ambulatory Visit (HOSPITAL_BASED_OUTPATIENT_CLINIC_OR_DEPARTMENT_OTHER): Payer: Commercial Managed Care - HMO | Admitting: Anesthesiology

## 2022-09-11 DIAGNOSIS — F411 Generalized anxiety disorder: Secondary | ICD-10-CM | POA: Diagnosis not present

## 2022-09-11 DIAGNOSIS — K219 Gastro-esophageal reflux disease without esophagitis: Secondary | ICD-10-CM | POA: Insufficient documentation

## 2022-09-11 DIAGNOSIS — F3181 Bipolar II disorder: Secondary | ICD-10-CM | POA: Insufficient documentation

## 2022-09-11 DIAGNOSIS — Z9049 Acquired absence of other specified parts of digestive tract: Secondary | ICD-10-CM | POA: Diagnosis not present

## 2022-09-11 DIAGNOSIS — G4733 Obstructive sleep apnea (adult) (pediatric): Secondary | ICD-10-CM | POA: Diagnosis not present

## 2022-09-11 DIAGNOSIS — K603 Anal fistula: Secondary | ICD-10-CM | POA: Diagnosis present

## 2022-09-11 DIAGNOSIS — K50913 Crohn's disease, unspecified, with fistula: Secondary | ICD-10-CM | POA: Insufficient documentation

## 2022-09-11 DIAGNOSIS — K610 Anal abscess: Secondary | ICD-10-CM

## 2022-09-11 HISTORY — PX: PLACEMENT OF SETON: SHX6029

## 2022-09-11 LAB — POCT PREGNANCY, URINE: Preg Test, Ur: NEGATIVE

## 2022-09-11 SURGERY — PLACEMENT, SETON
Anesthesia: General | Site: Rectum

## 2022-09-11 MED ORDER — SODIUM CHLORIDE 0.9% FLUSH: 3.0000 mL | Freq: Two times a day (BID) | INTRAVENOUS | Status: DC

## 2022-09-11 MED ORDER — FENTANYL CITRATE (PF) 100 MCG/2ML IJ SOLN: 25.0000 ug | INTRAMUSCULAR | Status: DC | PRN

## 2022-09-11 MED ORDER — PROPOFOL 1000 MG/100ML IV EMUL
INTRAVENOUS | Status: AC
  Filled 2022-09-11: qty 100

## 2022-09-11 MED ORDER — ONDANSETRON HCL 4 MG/2ML IJ SOLN
INTRAMUSCULAR | Status: DC | PRN
  Administered 2022-09-11: 4 mg via INTRAVENOUS

## 2022-09-11 MED ORDER — LIDOCAINE HCL (PF) 2 % IJ SOLN
INTRAMUSCULAR | Status: AC
  Filled 2022-09-11: qty 5

## 2022-09-11 MED ORDER — CELECOXIB 200 MG PO CAPS
200.0000 mg | ORAL_CAPSULE | ORAL | Status: AC
  Administered 2022-09-11: 200 mg via ORAL

## 2022-09-11 MED ORDER — DEXAMETHASONE SODIUM PHOSPHATE 4 MG/ML IJ SOLN
INTRAMUSCULAR | Status: DC | PRN
  Administered 2022-09-11: 5 mg via INTRAVENOUS

## 2022-09-11 MED ORDER — LIDOCAINE HCL (CARDIAC) PF 100 MG/5ML IV SOSY
PREFILLED_SYRINGE | INTRAVENOUS | Status: DC | PRN
  Administered 2022-09-11: 100 mg via INTRAVENOUS

## 2022-09-11 MED ORDER — MIDAZOLAM HCL 5 MG/5ML IJ SOLN
INTRAMUSCULAR | Status: DC | PRN
  Administered 2022-09-11: 2 mg via INTRAVENOUS

## 2022-09-11 MED ORDER — KETOROLAC TROMETHAMINE 30 MG/ML IJ SOLN
INTRAMUSCULAR | Status: AC
  Filled 2022-09-11: qty 1

## 2022-09-11 MED ORDER — AMISULPRIDE (ANTIEMETIC) 5 MG/2ML IV SOLN: 10.0000 mg | Freq: Once | INTRAVENOUS | Status: DC | PRN

## 2022-09-11 MED ORDER — CELECOXIB 200 MG PO CAPS
ORAL_CAPSULE | ORAL | Status: AC
  Filled 2022-09-11: qty 1

## 2022-09-11 MED ORDER — FENTANYL CITRATE (PF) 100 MCG/2ML IJ SOLN
INTRAMUSCULAR | Status: AC
  Filled 2022-09-11: qty 2

## 2022-09-11 MED ORDER — TRAMADOL HCL 50 MG PO TABS: 50.0000 mg | ORAL_TABLET | Freq: Four times a day (QID) | ORAL | 0 refills | Status: AC | PRN

## 2022-09-11 MED ORDER — ACETAMINOPHEN 500 MG PO TABS
1000.0000 mg | ORAL_TABLET | Freq: Once | ORAL | Status: AC
  Administered 2022-09-11: 1000 mg via ORAL

## 2022-09-11 MED ORDER — DEXAMETHASONE SODIUM PHOSPHATE 10 MG/ML IJ SOLN
INTRAMUSCULAR | Status: AC
  Filled 2022-09-11: qty 1

## 2022-09-11 MED ORDER — FENTANYL CITRATE (PF) 100 MCG/2ML IJ SOLN
INTRAMUSCULAR | Status: DC | PRN
  Administered 2022-09-11: 50 ug via INTRAVENOUS
  Administered 2022-09-11: 25 ug via INTRAVENOUS
  Administered 2022-09-11 (×2): 50 ug via INTRAVENOUS
  Administered 2022-09-11: 25 ug via INTRAVENOUS

## 2022-09-11 MED ORDER — MIDAZOLAM HCL 2 MG/2ML IJ SOLN
INTRAMUSCULAR | Status: AC
  Filled 2022-09-11: qty 2

## 2022-09-11 MED ORDER — ACETAMINOPHEN 325 MG PO TABS: 650.0000 mg | ORAL_TABLET | ORAL | Status: DC | PRN

## 2022-09-11 MED ORDER — PROPOFOL 10 MG/ML IV BOLUS
INTRAVENOUS | Status: DC | PRN
  Administered 2022-09-11: 200 mg via INTRAVENOUS

## 2022-09-11 MED ORDER — LACTATED RINGERS IV SOLN: INTRAVENOUS | Status: DC

## 2022-09-11 MED ORDER — ONDANSETRON HCL 4 MG/2ML IJ SOLN
INTRAMUSCULAR | Status: AC
  Filled 2022-09-11: qty 2

## 2022-09-11 MED ORDER — PROPOFOL 500 MG/50ML IV EMUL
INTRAVENOUS | Status: DC | PRN
  Administered 2022-09-11: 100 ug/kg/min via INTRAVENOUS

## 2022-09-11 MED ORDER — ACETAMINOPHEN 325 MG RE SUPP: 650.0000 mg | RECTAL | Status: DC | PRN

## 2022-09-11 MED ORDER — BUPIVACAINE-EPINEPHRINE 0.5% -1:200000 IJ SOLN
INTRAMUSCULAR | Status: DC | PRN
  Administered 2022-09-11: 30 mL

## 2022-09-11 MED ORDER — 0.9 % SODIUM CHLORIDE (POUR BTL) OPTIME
TOPICAL | Status: DC | PRN
  Administered 2022-09-11: 500 mL

## 2022-09-11 MED ORDER — ACETAMINOPHEN 500 MG PO TABS
ORAL_TABLET | ORAL | Status: AC
  Filled 2022-09-11: qty 2

## 2022-09-11 MED ORDER — SODIUM CHLORIDE 0.9% FLUSH: 3.0000 mL | INTRAVENOUS | Status: DC | PRN

## 2022-09-11 MED ORDER — PROPOFOL 10 MG/ML IV BOLUS
INTRAVENOUS | Status: AC
  Filled 2022-09-11: qty 20

## 2022-09-11 MED ORDER — SODIUM CHLORIDE 0.9 % IV SOLN: 250.0000 mL | INTRAVENOUS | Status: DC | PRN

## 2022-09-11 SURGICAL SUPPLY — 35 items
APL SKNCLS STERI-STRIP NONHPOA (GAUZE/BANDAGES/DRESSINGS) ×1
BENZOIN TINCTURE PRP APPL 2/3 (GAUZE/BANDAGES/DRESSINGS) ×1 IMPLANT
BLADE EXTENDED COATED 6.5IN (ELECTRODE) IMPLANT
BRIEF MESH DISP LRG (UNDERPADS AND DIAPERS) ×1 IMPLANT
COVER BACK TABLE 60X90IN (DRAPES) ×1 IMPLANT
COVER MAYO STAND STRL (DRAPES) ×1 IMPLANT
DRAPE LAPAROTOMY 100X72 PEDS (DRAPES) ×1 IMPLANT
DRAPE UTILITY XL STRL (DRAPES) ×1 IMPLANT
ELECT REM PT RETURN 9FT ADLT (ELECTROSURGICAL) ×1
ELECTRODE REM PT RTRN 9FT ADLT (ELECTROSURGICAL) ×1 IMPLANT
GAUZE 4X4 16PLY ~~LOC~~+RFID DBL (SPONGE) ×1 IMPLANT
GAUZE PAD ABD 8X10 STRL (GAUZE/BANDAGES/DRESSINGS) ×1 IMPLANT
GAUZE SPONGE 4X4 12PLY STRL (GAUZE/BANDAGES/DRESSINGS) IMPLANT
GLOVE BIO SURGEON STRL SZ 6.5 (GLOVE) ×1 IMPLANT
GLOVE BIOGEL PI IND STRL 7.0 (GLOVE) ×1 IMPLANT
GLOVE INDICATOR 6.5 STRL GRN (GLOVE) ×1 IMPLANT
HYDROGEN PEROXIDE 16OZ (MISCELLANEOUS) IMPLANT
KIT SIGMOIDOSCOPE (SET/KITS/TRAYS/PACK) IMPLANT
KIT TURNOVER CYSTO (KITS) ×1 IMPLANT
LOOP VASCLR MAXI BLUE 18IN ST (MISCELLANEOUS) ×1 IMPLANT
LOOP VASCULAR MAXI 18 BLUE (MISCELLANEOUS) ×1
LOOPS VASCLR MAXI BLUE 18IN ST (MISCELLANEOUS) ×1 IMPLANT
NEEDLE HYPO 22GX1.5 SAFETY (NEEDLE) ×1 IMPLANT
NS IRRIG 500ML POUR BTL (IV SOLUTION) ×1 IMPLANT
PACK BASIN DAY SURGERY FS (CUSTOM PROCEDURE TRAY) ×1 IMPLANT
PAD ARMBOARD 7.5X6 YLW CONV (MISCELLANEOUS) ×1 IMPLANT
PENCIL SMOKE EVACUATOR (MISCELLANEOUS) IMPLANT
SUT CHROMIC 3 0 SH 27 (SUTURE) IMPLANT
SUT ETHIBOND 0 (SUTURE) ×1 IMPLANT
SYR CONTROL 10ML LL (SYRINGE) ×1 IMPLANT
TOWEL OR 17X26 10 PK STRL BLUE (TOWEL DISPOSABLE) ×1 IMPLANT
TRAY DSU PREP LF (CUSTOM PROCEDURE TRAY) ×1 IMPLANT
TUBE CONNECTING 12X1/4 (SUCTIONS) ×1 IMPLANT
VASCULAR TIE MAXI BLUE 18IN ST (MISCELLANEOUS) ×1
YANKAUER SUCT BULB TIP NO VENT (SUCTIONS) ×1 IMPLANT

## 2022-09-11 NOTE — Interval H&P Note (Signed)
History and Physical Interval Note:  09/11/2022 11:51 AM  Chelsea Hoover  has presented today for surgery, with the diagnosis of RECURRENT ANAL FISTULA.  The various methods of treatment have been discussed with the patient and family. After consideration of risks, benefits and other options for treatment, the patient has consented to  Procedure(s) with comments: PLACEMENT OF SETON (N/A) - 18 MINS NEEDED IN ROOM 3 as a surgical intervention.  The patient's history has been reviewed, patient examined, no change in status, stable for surgery.  I have reviewed the patient's chart and labs.  Questions were answered to the patient's satisfaction.     Rosario Adie, MD  Colorectal and [place] [facility]

## 2022-09-11 NOTE — Anesthesia Procedure Notes (Signed)
Procedure Name: LMA Insertion Date/Time: 09/11/2022 1:10 PM  Performed by: Justice Rocher, CRNAPre-anesthesia Checklist: Patient identified, Emergency Drugs available, Suction available, Patient being monitored and Timeout performed Patient Re-evaluated:Patient Re-evaluated prior to induction Oxygen Delivery Method: Circle system utilized Preoxygenation: Pre-oxygenation with 100% oxygen Induction Type: IV induction Ventilation: Mask ventilation without difficulty LMA: LMA inserted LMA Size: 4.0 Number of attempts: 1 Airway Equipment and Method: Bite block Placement Confirmation: positive ETCO2, breath sounds checked- equal and bilateral and CO2 detector Tube secured with: Tape Dental Injury: Teeth and Oropharynx as per pre-operative assessment

## 2022-09-11 NOTE — Discharge Instructions (Addendum)
Beginning the day after surgery:  You may sit in a tub of warm water 2-3 times a day to relieve discomfort.  Eat a regular diet high in fiber.  Avoid foods that give you constipation or diarrhea.  Avoid foods that are difficult to digest, such as seeds, nuts, corn or popcorn.  Do not go any longer than 2 days without a bowel movement.  You may take a dose of Milk of Magnesia if you become constipated.    Drink 6-8 glasses of water daily.  Walking is encouraged.  Avoid strenuous activity and heavy lifting for one month after surgery.    Call the office if you have any questions or concerns.  Call immediately if you develop:  Excessive rectal bleeding (more than a cup or passing large clots) Increased discomfort Fever greater than 100 F Difficulty urinating   Post Anesthesia Home Care Instructions  Activity: Get plenty of rest for the remainder of the day. A responsible individual must stay with you for 24 hours following the procedure.  For the next 24 hours, DO NOT: -Drive a car -Paediatric nurse -Drink alcoholic beverages -Take any medication unless instructed by your physician -Make any legal decisions or sign important papers.  Meals: Start with liquid foods such as gelatin or soup. Progress to regular foods as tolerated. Avoid greasy, spicy, heavy foods. If nausea and/or vomiting occur, drink only clear liquids until the nausea and/or vomiting subsides. Call your physician if vomiting continues.  Special Instructions/Symptoms: Your throat may feel dry or sore from the anesthesia or the breathing tube placed in your throat during surgery. If this causes discomfort, gargle with warm salt water. The discomfort should disappear within 24 hours.   May take Ibuprofen beginning at 8 PM as needed for soreness/discomfort.  Regional Anesthesia Blocks  1. Numbness or the inability to move the "blocked" extremity may last from 3-48 hours after placement. The length of time depends  on the medication injected and your individual response to the medication. If the numbness is not going away after 48 hours, call your surgeon.  2. The extremity that is blocked will need to be protected until the numbness is gone and the  Strength has returned. Because you cannot feel it, you will need to take extra care to avoid injury. Because it may be weak, you may have difficulty moving it or using it. You may not know what position it is in without looking at it while the block is in effect.  3. For blocks in the legs and feet, returning to weight bearing and walking needs to be done carefully. You will need to wait until the numbness is entirely gone and the strength has returned. You should be able to move your leg and foot normally before you try and bear weight or walk. You will need someone to be with you when you first try to ensure you do not fall and possibly risk injury.  4. Bruising and tenderness at the needle site are common side effects and will resolve in a few days.  5. Persistent numbness or new problems with movement should be communicated to the surgeon.

## 2022-09-11 NOTE — Anesthesia Preprocedure Evaluation (Addendum)
Anesthesia Evaluation  Patient identified by MRN, date of birth, ID band Patient awake    Reviewed: Allergy & Precautions, NPO status , Patient's Chart, lab work & pertinent test results  Airway Mallampati: II  TM Distance: >3 FB Neck ROM: Full    Dental  (+) Dental Advisory Given   Pulmonary asthma , sleep apnea    breath sounds clear to auscultation       Cardiovascular negative cardio ROS  Rhythm:Regular Rate:Normal     Neuro/Psych   Anxiety Depression Bipolar Disorder   negative neurological ROS     GI/Hepatic Neg liver ROS,GERD  ,,  Endo/Other  negative endocrine ROS    Renal/GU negative Renal ROS     Musculoskeletal   Abdominal   Peds  Hematology negative hematology ROS (+)   Anesthesia Other Findings   Reproductive/Obstetrics                             Anesthesia Physical Anesthesia Plan  ASA: 2  Anesthesia Plan: General   Post-op Pain Management: Tylenol PO (pre-op)* and Celebrex PO (pre-op)*   Induction: Intravenous  PONV Risk Score and Plan: 3 and Dexamethasone, Ondansetron, Propofol infusion, Treatment may vary due to age or medical condition and TIVA  Airway Management Planned: LMA  Additional Equipment: None  Intra-op Plan:   Post-operative Plan: Extubation in OR  Informed Consent: I have reviewed the patients History and Physical, chart, labs and discussed the procedure including the risks, benefits and alternatives for the proposed anesthesia with the patient or authorized representative who has indicated his/her understanding and acceptance.     Dental advisory given  Plan Discussed with: CRNA  Anesthesia Plan Comments:        Anesthesia Quick Evaluation

## 2022-09-11 NOTE — Transfer of Care (Signed)
Immediate Anesthesia Transfer of Care Note  Patient: Chelsea Hoover  Procedure(s) Performed: Procedure(s) (LRB): PLACEMENT OF SETON (N/A)  Patient Location: PACU  Anesthesia Type: General  Level of Consciousness: awake, sedated, patient cooperative and responds to stimulation  Airway & Oxygen Therapy: Patient Spontanous Breathing and Patient connected to  oxygen  Post-op Assessment: Report given to PACU RN, Post -op Vital signs reviewed and stable and Patient moving all extremities  Post vital signs: Reviewed and stable  Complications: No apparent anesthesia complications

## 2022-09-11 NOTE — Anesthesia Postprocedure Evaluation (Signed)
Anesthesia Post Note  Patient: Chelsea Hoover  Procedure(s) Performed: PLACEMENT OF SETON (Rectum)     Patient location during evaluation: PACU Anesthesia Type: General Level of consciousness: awake and alert Pain management: pain level controlled Vital Signs Assessment: post-procedure vital signs reviewed and stable Respiratory status: spontaneous breathing, nonlabored ventilation, respiratory function stable and patient connected to nasal cannula oxygen Cardiovascular status: blood pressure returned to baseline and stable Postop Assessment: no apparent nausea or vomiting Anesthetic complications: no  No notable events documented.  Last Vitals:  Vitals:   09/11/22 1415 09/11/22 1425  BP: 108/73 126/79  Pulse: 71 74  Resp: (!) 30 20  Temp:  (!) 36.3 C  SpO2: 99% 100%    Last Pain:  Vitals:   09/11/22 1425  TempSrc:   PainSc: 0-No pain                 Tiajuana Amass

## 2022-09-11 NOTE — Op Note (Signed)
09/11/2022  1:29 PM  PATIENT:  Chelsea Hoover  27 y.o. female  Patient Care Team: Dulce Sellar, MD as PCP - General (General Practice)  PRE-OPERATIVE DIAGNOSIS:  RECURRENT ANAL FISTULA  POST-OPERATIVE DIAGNOSIS:  RECURRENT ANAL FISTULA  PROCEDURE:  SURGICAL MANAGEMENT OF ANAL FISTULA BY PLACEMENT OF SETON    Surgeon(s): Leighton Ruff, MD  ASSISTANT: none   ANESTHESIA:   local and general  SPECIMEN:  No Specimen  DISPOSITION OF SPECIMEN:  N/A  COUNTS:  YES  PLAN OF CARE: Discharge to home after PACU  PATIENT DISPOSITION:  PACU - hemodynamically stable.  INDICATION: 27 y.o. F with Crohn's s/p LIFT procedure with recurrence   OR FINDINGS: Trans-sphincteric fistula  DESCRIPTION: the patient was identified in the preoperative holding area and taken to the OR where they were laid on the operating room table.  General anesthesia was induced without difficulty. The patient was then positioned in prone jackknife position with buttocks gently taped apart.  The patient was then prepped and draped in usual sterile fashion.  SCDs were noted to be in place prior to the initiation of anesthesia. A surgical timeout was performed indicating the correct patient, procedure, positioning and need for preoperative antibiotics.  A rectal block was performed using Marcaine with epinephrine.    I began with a digital rectal exam.  There was a fluid collection in the left anterior perianal region.  I then placed a Hill-Ferguson anoscope into the anal canal and evaluated this completely.  There was little anal canal inflammation.  I placed a initiate fistula probe into the external opening.  This entered into the cavity and then the internal opening.  I then used a 0 Ethibond suture to pull through the fistula.  I then used this suture to pull a vessel loop back through the fistula.  The Ethibond suture was then used to close the vessel loop with 3 sutures.  Additional Marcaine was placed around the  surgical site.  The patient was then awakened from anesthesia and sent to the postanesthesia care unit in stable condition.  All counts were correct per operating room staff.  Rosario Adie, MD  Colorectal and [place] [facility]

## 2022-09-12 ENCOUNTER — Encounter (HOSPITAL_BASED_OUTPATIENT_CLINIC_OR_DEPARTMENT_OTHER): Payer: Self-pay | Admitting: General Surgery

## 2022-10-05 IMAGING — CT CT PELVIS W/ CM
2 of 3 series · 16 of 46 positions shown, 18 images · IV contrast (APPLIED)
Comparison: None.

CLINICAL DATA: Concern for perianal abscess.

EXAM:
CT PELVIS WITH CONTRAST
TECHNIQUE: Multidetector CT imaging of the pelvis was performed using the
standard protocol following the bolus administration of intravenous
contrast.
CONTRAST:  80mL OMNIPAQUE IOHEXOL 350 MG/ML SOLN

[Series 4: soft tissue · axial · 0.96mm/px · z∈[+641,+955]mm · 13 of 181 slices shown, 15 images]
[im 12/181  soft-tissue]
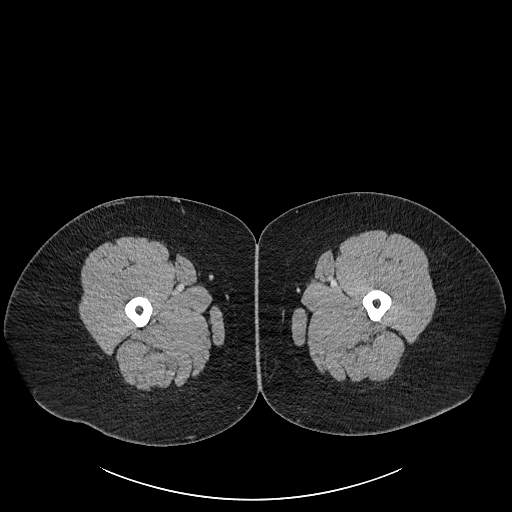
[im 12/181  bone]
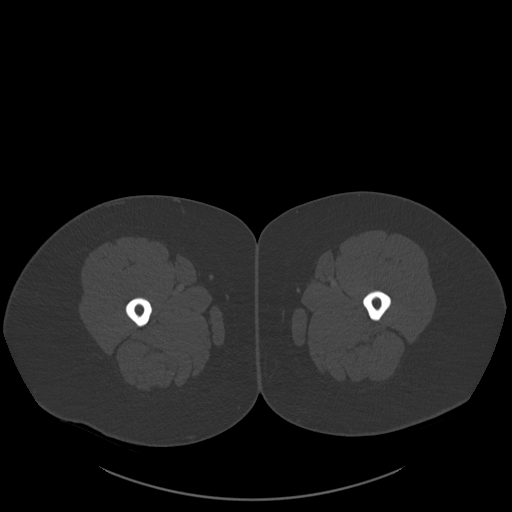
[im 24/181  soft-tissue]
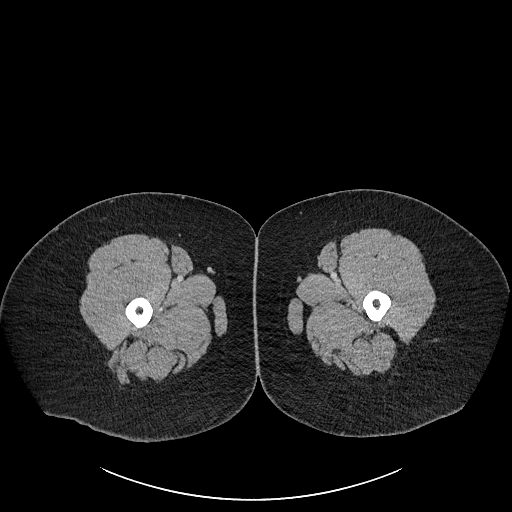
[im 35/181  soft-tissue]
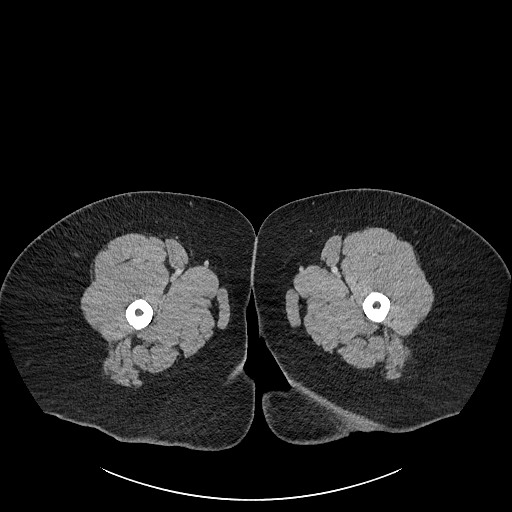
[im 53/181  soft-tissue]
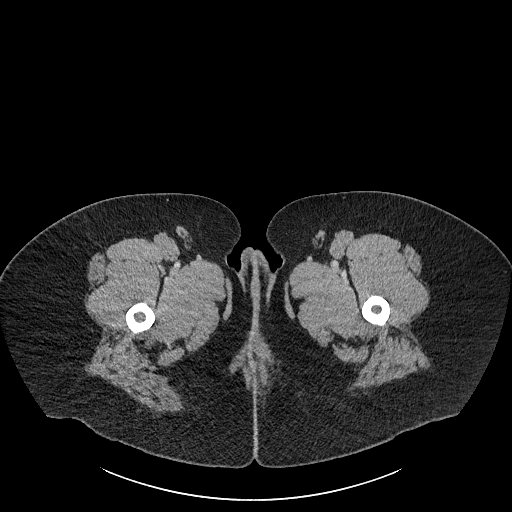
[im 64/181  soft-tissue]
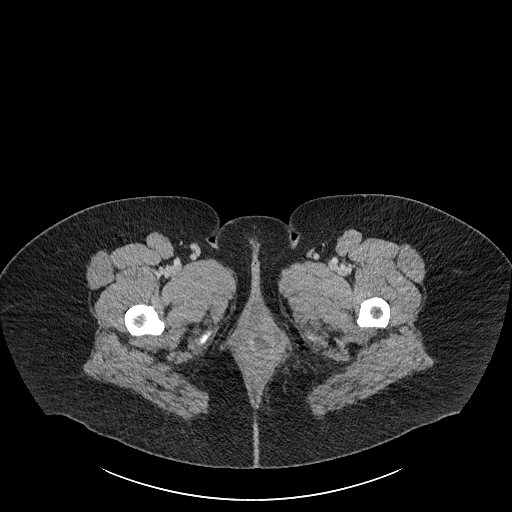
[im 76/181  soft-tissue]
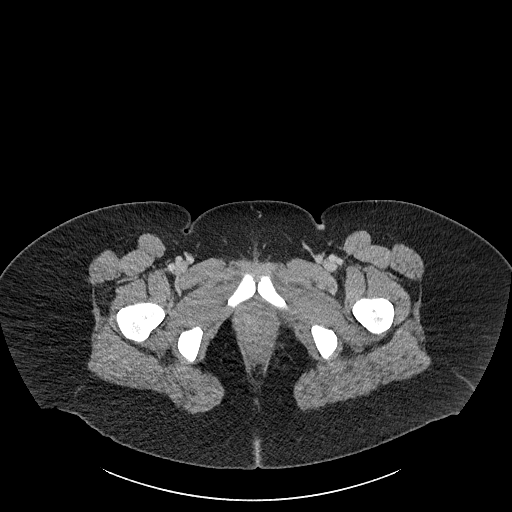
[im 93/181  soft-tissue]
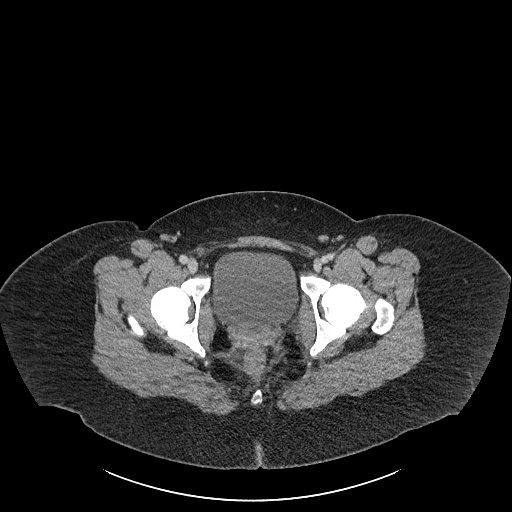
[im 105/181  soft-tissue]
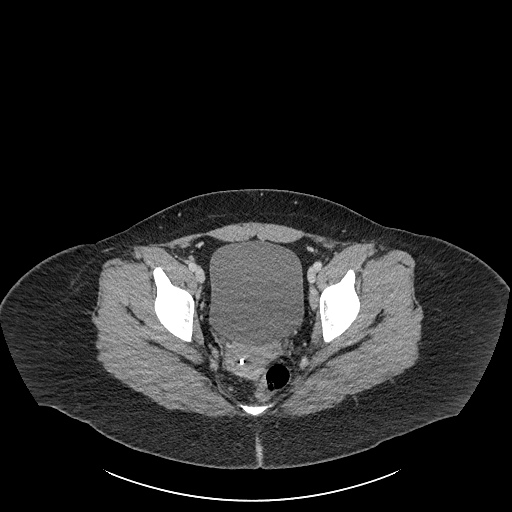
[im 117/181  soft-tissue]
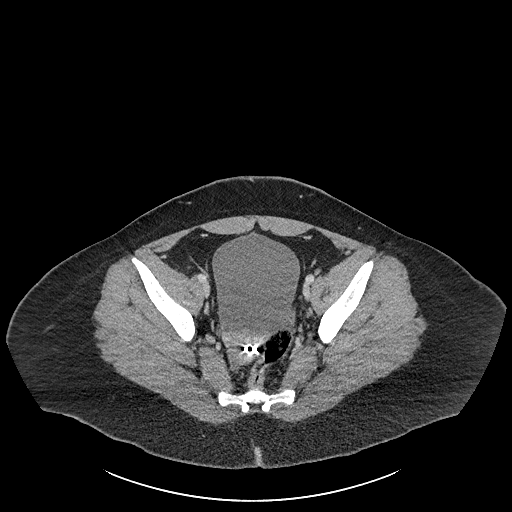
[im 117/181  bone]
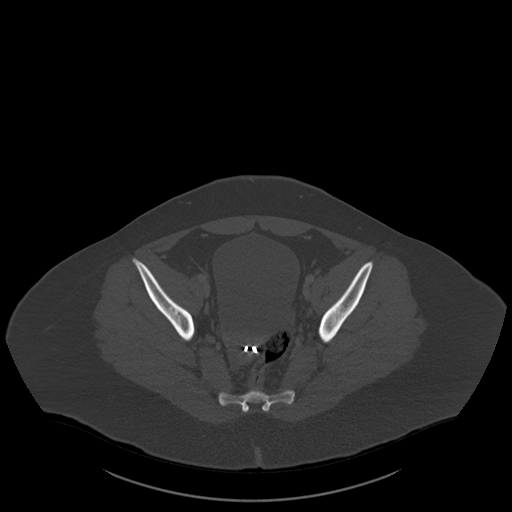
[im 128/181  soft-tissue]
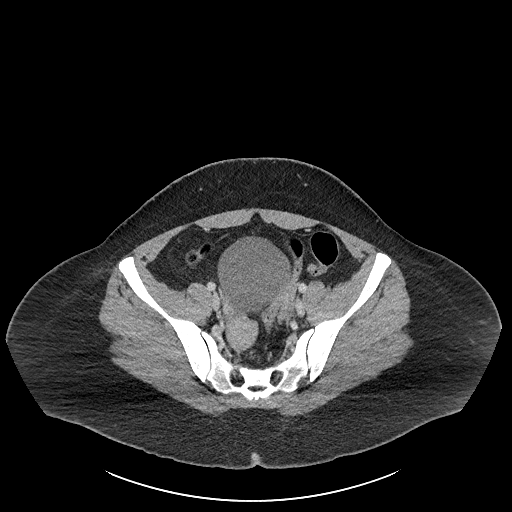
[im 146/181  soft-tissue]
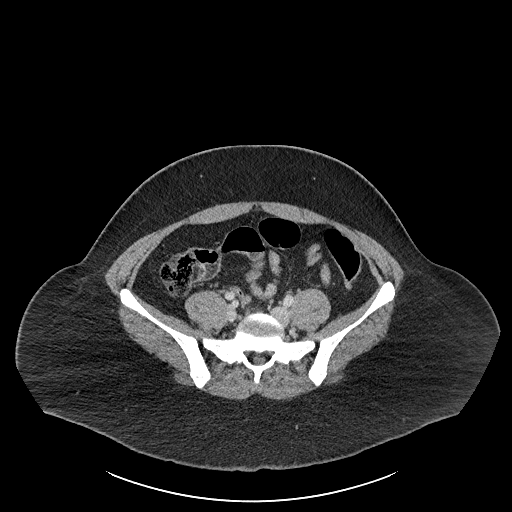
[im 157/181  soft-tissue]
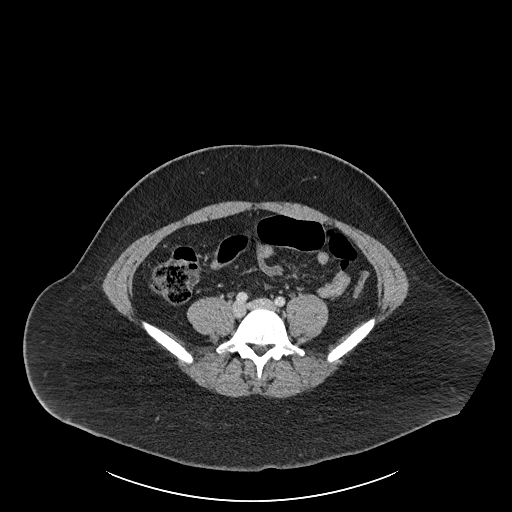
[im 169/181  soft-tissue]
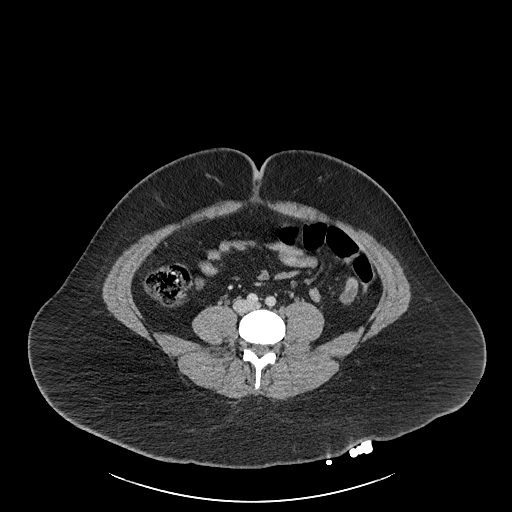

[Series 7: cor bone · coronal · 0.71mm/px · 3 of 159 slices shown]
[im 53/159  soft-tissue]
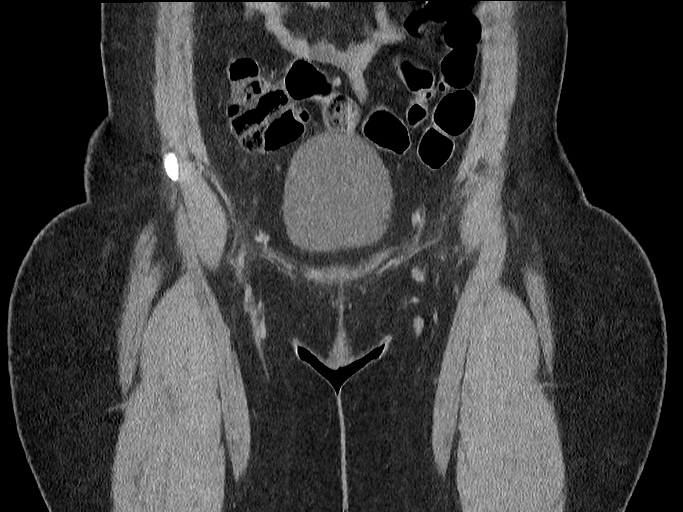
[im 71/159  soft-tissue]
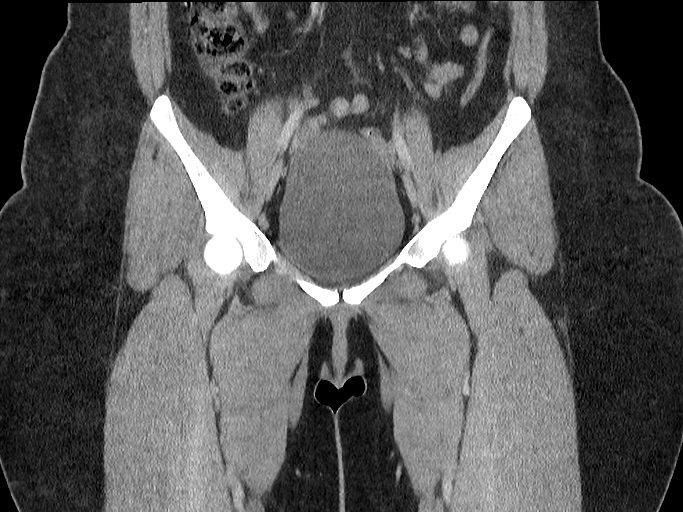
[im 88/159  soft-tissue]
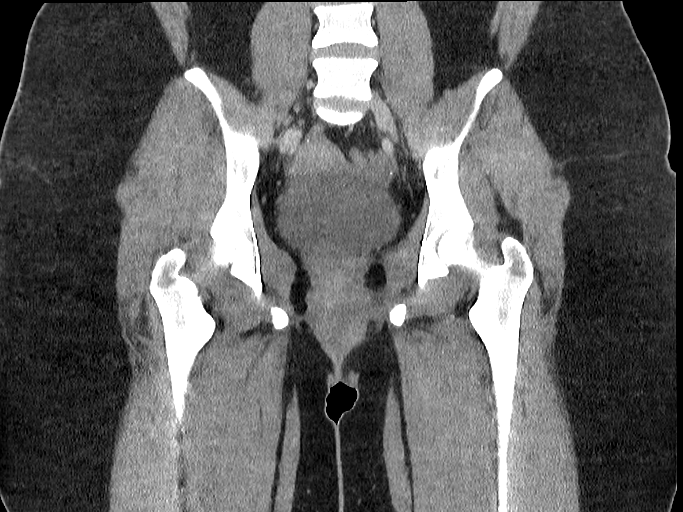

[16 of 46 positions shown; findings below may reference images not displayed]

FINDINGS: Urinary Tract:  No abnormality visualized.

Bowel:  Unremarkable visualized pelvic bowel loops.

Vascular/Lymphatic: No pathologically enlarged lymph nodes. No
significant vascular abnormality seen.

Reproductive: The uterus is anteverted. An intrauterine device is
noted. Evaluation for positioning of the IUD is very limited on this
CT. However, the IUD may be slightly inferiorly displaced. Further
evaluation with pelvic ultrasound on a nonemergent/outpatient basis
recommended. The ovaries are unremarkable.

Other: There is inflammatory changes and induration of the
superficial soft tissues of the left perianal region. There is a
x 2.7 cm ill-defined low-attenuation in the left perianal soft
tissues which may represent a phlegmon or developing abscess.
Ultrasound may provide better evaluation.

Musculoskeletal: No suspicious bone lesions identified.
IMPRESSION: Left perianal phlegmon or developing abscess.

## 2023-04-09 ENCOUNTER — Other Ambulatory Visit (HOSPITAL_COMMUNITY): Payer: Self-pay | Admitting: Gastroenterology

## 2023-04-09 DIAGNOSIS — K603 Anal fistula: Secondary | ICD-10-CM
